# Patient Record
Sex: Female | Born: 1935 | Race: White | Hispanic: No | Marital: Single | Smoking: Former smoker
Health system: Southern US, Community
[De-identification: ages and names within clinical notes are randomized; demographics above are authoritative.]

## PROBLEM LIST (undated history)

## (undated) DIAGNOSIS — M1990 Unspecified osteoarthritis, unspecified site: Secondary | ICD-10-CM

## (undated) DIAGNOSIS — R269 Unspecified abnormalities of gait and mobility: Secondary | ICD-10-CM

## (undated) DIAGNOSIS — Z8619 Personal history of other infectious and parasitic diseases: Secondary | ICD-10-CM

## (undated) DIAGNOSIS — R4781 Slurred speech: Secondary | ICD-10-CM

## (undated) DIAGNOSIS — R05 Cough: Secondary | ICD-10-CM

## (undated) DIAGNOSIS — H269 Unspecified cataract: Secondary | ICD-10-CM

## (undated) DIAGNOSIS — M2450 Contracture, unspecified joint: Secondary | ICD-10-CM

## (undated) DIAGNOSIS — R059 Cough, unspecified: Secondary | ICD-10-CM

## (undated) DIAGNOSIS — R310 Gross hematuria: Secondary | ICD-10-CM

## (undated) DIAGNOSIS — N1330 Unspecified hydronephrosis: Secondary | ICD-10-CM

## (undated) DIAGNOSIS — N319 Neuromuscular dysfunction of bladder, unspecified: Secondary | ICD-10-CM

## (undated) DIAGNOSIS — R600 Localized edema: Secondary | ICD-10-CM

## (undated) DIAGNOSIS — I10 Essential (primary) hypertension: Secondary | ICD-10-CM

## (undated) DIAGNOSIS — J302 Other seasonal allergic rhinitis: Secondary | ICD-10-CM

## (undated) DIAGNOSIS — A419 Sepsis, unspecified organism: Secondary | ICD-10-CM

## (undated) DIAGNOSIS — N390 Urinary tract infection, site not specified: Secondary | ICD-10-CM

## (undated) DIAGNOSIS — M4800 Spinal stenosis, site unspecified: Secondary | ICD-10-CM

## (undated) DIAGNOSIS — N3020 Other chronic cystitis without hematuria: Secondary | ICD-10-CM

## (undated) DIAGNOSIS — G808 Other cerebral palsy: Secondary | ICD-10-CM

## (undated) DIAGNOSIS — M069 Rheumatoid arthritis, unspecified: Secondary | ICD-10-CM

## (undated) DIAGNOSIS — C801 Malignant (primary) neoplasm, unspecified: Secondary | ICD-10-CM

## (undated) DIAGNOSIS — N3941 Urge incontinence: Secondary | ICD-10-CM

## (undated) HISTORY — PX: DG TOES RT FOOT MULTIPLE SPECIF (ARMC HX): HXRAD1007

## (undated) HISTORY — PX: JOINT REPLACEMENT: SHX530

---

## 1993-11-10 HISTORY — PX: CHOLECYSTECTOMY: SHX55

## 2003-11-11 HISTORY — PX: TOTAL KNEE ARTHROPLASTY: SHX125

## 2012-10-12 DIAGNOSIS — M4802 Spinal stenosis, cervical region: Secondary | ICD-10-CM

## 2012-10-12 DIAGNOSIS — F411 Generalized anxiety disorder: Secondary | ICD-10-CM

## 2012-10-12 DIAGNOSIS — I4891 Unspecified atrial fibrillation: Secondary | ICD-10-CM

## 2012-10-12 DIAGNOSIS — I10 Essential (primary) hypertension: Secondary | ICD-10-CM

## 2012-11-15 ENCOUNTER — Telehealth: Payer: Self-pay | Admitting: Internal Medicine

## 2012-11-15 NOTE — Telephone Encounter (Signed)
noted 

## 2012-11-15 NOTE — Telephone Encounter (Signed)
[facility] Triage Call Report Triage Record Num: [id number] Operator: Valene Bors Patient Name: Stacey Torres Call Date & Time: 11/13/2012 12:13:03PM Patient Phone: [phone] PCP: Ruthe Mannan Patient Gender: Female PCP Fax : [phone] Patient DOB: 31-Mar-1929 [facility] Name[facility] Reason for Call: Caller: Becky/LPN; PCP: Ruthe Mannan (Family Practice); CB#: [phone]; Call regarding Urinalysis Results- U/A ordered on 11/12/12 by Dr. Dayton Martes after blood in urine on 11/10/12 and fever =100.1 oral. Afebrile today- 11/13/12. U/A showed Large Proteins and Leukocytes, > 50,000 wbc, 3-6 rbc and Many bacteria. She is having some burning and frequency. Incontinent, wears adult attends. Triage and Care Advice per Urinary Symptoms- Female Protocol MD on call contacted, Dr. Laury Axon and results reported. She ordered Cipro 250 mgs 1 PO BID x 5 days and to be sure Cultures Sent. Order given to [facility], LPN and advised to call back with Culture Results. Protocol(s) Used: Urinary Symptoms - Female Recommended Outcome per Protocol: See Provider within 72 Hours Reason for Outcome: Evaluated by provider AND no improvement in symptoms after following treatment plan for the time specified by provider Care Advice: ~ Do not go for long periods without urinating; empty your bladder completely when you urinate. ~ Go to the ED IMMEDIATELY if repeated vomiting or severe pain occurs. ~ Do not change prescribed medications, dosing regimen, or other treatments until consulting with your provider. ~ Call provider if you develop flank or low back pain, fever, generally feel sick. Continue to follow your treatment plan. Take all medications as prescribed and keep all appointments with your provider. ~ ~ SYMPTOM / CONDITION MANAGEMENT Most adults need to drink 6-10 eight-ounce glasses (1.2-2.0 liters) of fluids per day unless previously told to limit fluid intake for other medical reasons. Limit fluids  that contain caffeine, sugar or alcohol. Urine will be a very light yellow color when you drink enough fluids. ~ ~ Call provider if urine is pink, red, smoky or cola colored. 11/13/2012 1:00:12PM Page 1 of 1 CAN_TriageRpt_V2

## 2012-11-18 DIAGNOSIS — G250 Essential tremor: Secondary | ICD-10-CM

## 2012-11-18 DIAGNOSIS — F411 Generalized anxiety disorder: Secondary | ICD-10-CM

## 2012-11-18 DIAGNOSIS — M4802 Spinal stenosis, cervical region: Secondary | ICD-10-CM

## 2012-11-18 DIAGNOSIS — F329 Major depressive disorder, single episode, unspecified: Secondary | ICD-10-CM

## 2012-11-18 DIAGNOSIS — G252 Other specified forms of tremor: Secondary | ICD-10-CM

## 2012-12-02 DIAGNOSIS — R319 Hematuria, unspecified: Secondary | ICD-10-CM

## 2012-12-21 DIAGNOSIS — I10 Essential (primary) hypertension: Secondary | ICD-10-CM

## 2012-12-21 DIAGNOSIS — F329 Major depressive disorder, single episode, unspecified: Secondary | ICD-10-CM

## 2012-12-21 DIAGNOSIS — G479 Sleep disorder, unspecified: Secondary | ICD-10-CM

## 2012-12-21 DIAGNOSIS — M4802 Spinal stenosis, cervical region: Secondary | ICD-10-CM

## 2013-01-11 DIAGNOSIS — I10 Essential (primary) hypertension: Secondary | ICD-10-CM

## 2013-01-11 DIAGNOSIS — G252 Other specified forms of tremor: Secondary | ICD-10-CM

## 2013-01-11 DIAGNOSIS — M4802 Spinal stenosis, cervical region: Secondary | ICD-10-CM

## 2013-01-11 DIAGNOSIS — F329 Major depressive disorder, single episode, unspecified: Secondary | ICD-10-CM

## 2013-01-11 DIAGNOSIS — G250 Essential tremor: Secondary | ICD-10-CM

## 2013-01-25 DIAGNOSIS — J3489 Other specified disorders of nose and nasal sinuses: Secondary | ICD-10-CM

## 2013-01-25 DIAGNOSIS — G252 Other specified forms of tremor: Secondary | ICD-10-CM

## 2013-01-25 DIAGNOSIS — G250 Essential tremor: Secondary | ICD-10-CM

## 2013-02-03 DIAGNOSIS — M65849 Other synovitis and tenosynovitis, unspecified hand: Secondary | ICD-10-CM

## 2013-02-03 DIAGNOSIS — M65839 Other synovitis and tenosynovitis, unspecified forearm: Secondary | ICD-10-CM

## 2013-02-18 DIAGNOSIS — G808 Other cerebral palsy: Secondary | ICD-10-CM

## 2013-02-18 DIAGNOSIS — R259 Unspecified abnormal involuntary movements: Secondary | ICD-10-CM

## 2013-02-18 DIAGNOSIS — R269 Unspecified abnormalities of gait and mobility: Secondary | ICD-10-CM

## 2013-03-15 DIAGNOSIS — M6281 Muscle weakness (generalized): Secondary | ICD-10-CM

## 2013-03-16 ENCOUNTER — Ambulatory Visit: Payer: Self-pay | Admitting: Internal Medicine

## 2013-05-02 ENCOUNTER — Ambulatory Visit: Payer: Self-pay | Admitting: Neurology

## 2013-05-02 LAB — CREATININE, SERUM
Creatinine: 0.62 mg/dL (ref 0.60–1.30)
EGFR (Non-African Amer.): >60

## 2013-05-05 DIAGNOSIS — J209 Acute bronchitis, unspecified: Secondary | ICD-10-CM

## 2013-05-30 DIAGNOSIS — F411 Generalized anxiety disorder: Secondary | ICD-10-CM

## 2013-05-30 DIAGNOSIS — M4802 Spinal stenosis, cervical region: Secondary | ICD-10-CM

## 2013-05-30 DIAGNOSIS — M19049 Primary osteoarthritis, unspecified hand: Secondary | ICD-10-CM

## 2013-05-30 DIAGNOSIS — F329 Major depressive disorder, single episode, unspecified: Secondary | ICD-10-CM

## 2013-06-03 ENCOUNTER — Encounter: Payer: Self-pay | Admitting: Rheumatology

## 2013-06-10 ENCOUNTER — Encounter: Payer: Self-pay | Admitting: Rheumatology

## 2013-06-13 DIAGNOSIS — N3000 Acute cystitis without hematuria: Secondary | ICD-10-CM

## 2013-06-16 ENCOUNTER — Telehealth: Payer: Self-pay

## 2013-06-16 NOTE — Telephone Encounter (Signed)
Melba nurse with [facility] said pt has ESBL in urine should pt continue Bactrim ER? Dr Para March saidif any symptoms to continue Bactrim ER taking one tab twice a day for 4 more days. And then call with update on pt condition. Melba said pt has burning and itching and pus in urine. Pt to continue Bactrim ER one tab bid for 4 more days. Melba voiced understanding.

## 2013-06-17 NOTE — Telephone Encounter (Signed)
Thanks.  Routed to PCP as FYI.  

## 2013-06-18 NOTE — Telephone Encounter (Signed)
Will review at [facility] on Monday the 11th when I am back

## 2013-06-27 ENCOUNTER — Encounter: Payer: Self-pay | Admitting: Family Medicine

## 2013-06-27 ENCOUNTER — Telehealth: Payer: Self-pay | Admitting: Family Medicine

## 2013-06-27 NOTE — Telephone Encounter (Signed)
[facility] Triage Call Report Triage Record Num: [id number] Operator: Chevis Pretty Patient Name: Stacey Torres Call Date & Time: 06/25/2013 5:48:07PM Patient Phone: [phone] PCP: Ruthe Mannan Patient Gender: Female PCP Fax : [phone] Patient DOB: 12-31-1928 [facility] Name[facility] Reason for Call: Caller: Cathy/LPN Shona Simpson; PCP: Ruthe Mannan [facility]); CB#: [phone]; Call regarding Urine culture results. Culture sent 06/20/13. Positive > 100K e coli. Sensitive to Macrobid. Resistant to cipro and Bactrim DS. Had completed Bactrim antibiotic after 6 days 06/20/13. NKDA. Afebrile. Stable vital signs, in her usual mentation. TC to Dr. Beverely Low; to start Macrobid 100mg  1 po BID x 10 days. Facility to follow up with office in Am 06/27/13. Protocol(s) Used: PCP Calls, No Triage (Adult) Recommended Outcome per Protocol: Call Provider within 24 Hours Reason for Outcome: Lab calling with test results Care Advice: ~ 06/25/2013 6:04:24PM Page 1 of 1 CAN_TriageRpt_V2

## 2013-06-27 NOTE — Telephone Encounter (Signed)
Heard about this at [facility] is that this was just a follow up due to multiple resistant organism---not clear she still has UTI

## 2013-07-11 ENCOUNTER — Encounter: Payer: Self-pay | Admitting: Rheumatology

## 2013-07-14 DIAGNOSIS — M4802 Spinal stenosis, cervical region: Secondary | ICD-10-CM

## 2013-07-14 DIAGNOSIS — F411 Generalized anxiety disorder: Secondary | ICD-10-CM

## 2013-07-14 DIAGNOSIS — G252 Other specified forms of tremor: Secondary | ICD-10-CM

## 2013-07-14 DIAGNOSIS — G250 Essential tremor: Secondary | ICD-10-CM

## 2013-09-13 DIAGNOSIS — F411 Generalized anxiety disorder: Secondary | ICD-10-CM

## 2013-09-13 DIAGNOSIS — M4802 Spinal stenosis, cervical region: Secondary | ICD-10-CM

## 2013-09-13 DIAGNOSIS — G252 Other specified forms of tremor: Secondary | ICD-10-CM

## 2013-09-13 DIAGNOSIS — G479 Sleep disorder, unspecified: Secondary | ICD-10-CM

## 2013-09-13 DIAGNOSIS — I10 Essential (primary) hypertension: Secondary | ICD-10-CM

## 2013-09-13 DIAGNOSIS — G250 Essential tremor: Secondary | ICD-10-CM

## 2013-09-13 DIAGNOSIS — M069 Rheumatoid arthritis, unspecified: Secondary | ICD-10-CM

## 2013-09-16 DIAGNOSIS — N390 Urinary tract infection, site not specified: Secondary | ICD-10-CM

## 2013-10-24 ENCOUNTER — Other Ambulatory Visit: Payer: Self-pay | Admitting: Internal Medicine

## 2013-10-24 NOTE — Telephone Encounter (Signed)
Ok to fill 

## 2013-10-24 NOTE — Telephone Encounter (Signed)
rx sent to pharmacy by e-script  

## 2013-10-24 NOTE — Telephone Encounter (Signed)
Okay to fill but they shouldn't have to send this here---the nurses at [facility] can reorder these meds automatically (she lives at [facility])

## 2013-10-28 ENCOUNTER — Ambulatory Visit: Payer: Self-pay

## 2013-11-14 ENCOUNTER — Telehealth: Payer: Self-pay | Admitting: Family Medicine

## 2013-11-14 DIAGNOSIS — I4891 Unspecified atrial fibrillation: Secondary | ICD-10-CM

## 2013-11-14 DIAGNOSIS — J069 Acute upper respiratory infection, unspecified: Secondary | ICD-10-CM

## 2013-11-14 DIAGNOSIS — I10 Essential (primary) hypertension: Secondary | ICD-10-CM

## 2013-11-14 NOTE — Telephone Encounter (Signed)
[facility] Triage Call Report Triage Record Num: [id number] Operator: Benita Stabile Patient Name: Stacey Torres Call Date & Time: 11/14/2013 12:29:09AM Patient Phone: [phone] PCP: Viviana Simpler Patient Gender: Female PCP Fax : [phone] Patient DOB: 02-Apr-1929 Practice Name: [facility] Reason for Call: Caller: Stephanie/RN; PCP: Viviana Simpler (Family Practice); CB#: [phone]; Call regarding Dizziness and elevated HR. Onset 11/13/13 at 2000. 100.4temps x 2 days. Non productive cough. Pale and clammy. 135/80 137HR pulse afebrile. 96 pulse ox. 99.8oral. 2 episodes since 2000. 95/70 BP 140 HR at 0030. Irregular heartrate. Pt DNR. Take Toprol 50mg  QA 8am, Doxazosin 2mg  QA am, x months. Resting now at 0055. Dr Maudie Mercury aware and agrees with sending pt out for evaluation. Stephanie aware and will let family and pt know. Care advice given per Irregular HR Protocol. Protocol(s) Used: Irregular Heartbeat Recommended Outcome per Protocol: See ED Immediately Reason for Outcome: New onset of rapid pulse (greater than 120 beats/minute at rest) OR slow pulse (less than 50 beats per minute at rest) Care Advice: ~ Protect the patient from falling or other harm. ~ Another adult should drive. ~ Do not give the patient anything to eat or drink. ~ IMMEDIATE ACTION Write down provider's name. List or place the following in a bag for transport with the patient: current prescription and/or nonprescription medications; alternative treatments, therapies and medications; and street drugs. ~ ~ Tell provider if you are taking any erectile dysfunction medication such as Viagra, Levitra, or Cialis. 11/14/2013 12:59:25AM Page 1 of 1 CAN_TriageRpt_V2

## 2013-11-14 NOTE — Telephone Encounter (Signed)
[facility] Triage Call Report Triage Record Num: [id number] Operator: Mary King Patient Name: Stacey Torres Call Date & Time: 11/14/2013 12:29:09AM Patient Phone: [phone] PCP: Richard Letvak Patient Gender: Female PCP Fax : [phone] Patient DOB: 12/17/1928 Practice Name: [facility] Reason for Call: Caller: Stephanie/RN; PCP: Letvak , Richard (Family Practice); CB#: [phone]; Call regarding Dizziness and elevated HR. Onset 11/13/13 at 2000. 100.4temps x 2 days. Non productive cough. Pale and clammy. 135/80 137HR pulse afebrile. 96 pulse ox. 99.8oral. 2 episodes since 2000. 95/70 BP 140 HR at 0030. Irregular heartrate. Pt DNR. Take Toprol 50mg QA 8am, Doxazosin 2mg QA am, x months. Resting now at 0055. Dr Kim aware and agrees with sending pt out for evaluation. Stephanie aware and will let family and pt know. Care advice given per Irregular HR Protocol. Protocol(s) Used: Irregular Heartbeat Recommended Outcome per Protocol: See ED Immediately Reason for Outcome: New onset of rapid pulse (greater than 120 beats/minute at rest) OR slow pulse (less than 50 beats per minute at rest) Care Advice: ~ Protect the patient from falling or other harm. ~ Another adult should drive. ~ Do not give the patient anything to eat or drink. ~ IMMEDIATE ACTION Write down provider's name. List or place the following in a bag for transport with the patient: current prescription and/or nonprescription medications; alternative treatments, therapies and medications; and street drugs. ~ ~ Tell provider if you are taking any erectile dysfunction medication such as Viagra, Levitra, or Cialis. 11/14/2013 12:59:25AM Page 1 of 1 CAN_TriageRpt_V2 

## 2013-11-14 NOTE — Telephone Encounter (Signed)
Seen today Probably had paroxysm of atrial fib and is fine today Metoprolol succinate increased

## 2013-11-17 ENCOUNTER — Other Ambulatory Visit: Payer: Self-pay | Admitting: Urology

## 2013-11-17 ENCOUNTER — Encounter (HOSPITAL_BASED_OUTPATIENT_CLINIC_OR_DEPARTMENT_OTHER): Payer: Self-pay | Admitting: *Deleted

## 2013-11-18 ENCOUNTER — Encounter (HOSPITAL_BASED_OUTPATIENT_CLINIC_OR_DEPARTMENT_OTHER): Payer: Self-pay | Admitting: *Deleted

## 2013-11-18 NOTE — Progress Notes (Addendum)
Called # [phone] given by office in epic. This is pt's niece Darrick Meigs. States pt resides at [facility] in [place]. The contact # [phone] and alt. # [phone], today pt nurse is Sharee Holster. Pam states pt is w/c bound and is contractured at 90 degrees due to advanced ra and hx cerebral palsy. At this time transportation is the facility's Lucianne Lei w/ pt in w/c and pt would need to be lifted to stretcher. Informed Pam that at an ambulatory facility we did not lift since we did not have equipment. But if pt were transported my medical ambulance on stretcher that would be ok , we can side slide her to our stretcher. None of this info was not communicated from office. Pam states if ambulance is needed that may need to reschedule because of having time to arrange this. Lm w/ selita , or scheduler for dr Risa Grill, await her return call so that we can arranged the best care for pt. Also have lm w/ Pam of possiblity of pt's facility providing assistance to lift pt here.  Will await call backs.  Spoke w/ wanda gaddy Surveyor, quantity and she states unless arrangement made for stretcher transport, since we do not lift pt's per policy (at ambulatory facility we do not have equipment for lifting), pt will need to be rescheduled when pt has her own lifting help or go to main. Called and spoke w/ pt niece, pam fox, informed her of final decision. She states she will speak with her mother, pt's sister, since they will have to cover cost of transport. She is to call back with decision of either transporting via stretcher or to reschedule.  SPOKE W/ PT NIECE AND PT SISTER AND NURSE AID THEY HIRED WILL LIFT/ MOVE PT .  REVIEWED W/ HER PT HISTORY BUT STATES TO REVIEW SURGICAL HX WITH PT AND SISTER DOS.  GIVEN PT NURSE TO CONTACT, MELVA MERIT, AT [facility] [phone].  HAVE SPOKEN TO MELVA MERIT INSTRUCTIONS GIVE PT TO BE NPO OF MN WITH EXCEPT SIP OF WATER W/ TOPROL.  SHE IS FAXING CURRENT MAR, WILL  UPDATE MED. LIST.  NEEDS ISTAT AND EKG.

## 2013-11-21 ENCOUNTER — Other Ambulatory Visit: Payer: Self-pay

## 2013-11-21 ENCOUNTER — Encounter (HOSPITAL_BASED_OUTPATIENT_CLINIC_OR_DEPARTMENT_OTHER): Payer: Self-pay | Admitting: Anesthesiology

## 2013-11-21 ENCOUNTER — Encounter (HOSPITAL_BASED_OUTPATIENT_CLINIC_OR_DEPARTMENT_OTHER)
Admission: RE | Disposition: A | Discharge status: Skilled Nursing Facility | Payer: Self-pay | Source: Ambulatory Visit | Attending: Urology

## 2013-11-21 ENCOUNTER — Encounter (HOSPITAL_BASED_OUTPATIENT_CLINIC_OR_DEPARTMENT_OTHER): Payer: Medicare Other | Admitting: Anesthesiology

## 2013-11-21 ENCOUNTER — Ambulatory Visit (HOSPITAL_BASED_OUTPATIENT_CLINIC_OR_DEPARTMENT_OTHER): Payer: Medicare Other | Admitting: Anesthesiology

## 2013-11-21 ENCOUNTER — Ambulatory Visit (HOSPITAL_BASED_OUTPATIENT_CLINIC_OR_DEPARTMENT_OTHER)
Admission: RE | Admit: 2013-11-21 | Discharge: 2013-11-21 | Disposition: A | Discharge status: Skilled Nursing Facility | Payer: Medicare Other | Source: Ambulatory Visit | Attending: Urology | Admitting: Urology

## 2013-11-21 DIAGNOSIS — R310 Gross hematuria: Secondary | ICD-10-CM

## 2013-11-21 DIAGNOSIS — I499 Cardiac arrhythmia, unspecified: Secondary | ICD-10-CM | POA: Insufficient documentation

## 2013-11-21 DIAGNOSIS — Z79899 Other long term (current) drug therapy: Secondary | ICD-10-CM | POA: Insufficient documentation

## 2013-11-21 DIAGNOSIS — I10 Essential (primary) hypertension: Secondary | ICD-10-CM | POA: Insufficient documentation

## 2013-11-21 DIAGNOSIS — M129 Arthropathy, unspecified: Secondary | ICD-10-CM | POA: Insufficient documentation

## 2013-11-21 DIAGNOSIS — N3090 Cystitis, unspecified without hematuria: Secondary | ICD-10-CM | POA: Insufficient documentation

## 2013-11-21 DIAGNOSIS — N1330 Unspecified hydronephrosis: Secondary | ICD-10-CM

## 2013-11-21 HISTORY — DX: Other chronic cystitis without hematuria: N30.20

## 2013-11-21 HISTORY — PX: CYSTOSCOPY WITH RETROGRADE PYELOGRAM, URETEROSCOPY AND STENT PLACEMENT: SHX5789

## 2013-11-21 HISTORY — DX: Unspecified abnormalities of gait and mobility: R26.9

## 2013-11-21 HISTORY — DX: Rheumatoid arthritis, unspecified: M06.9

## 2013-11-21 HISTORY — DX: Contracture, unspecified joint: M24.50

## 2013-11-21 HISTORY — DX: Urge incontinence: N39.41

## 2013-11-21 HISTORY — PX: CYSTOGRAM: SHX6285

## 2013-11-21 HISTORY — DX: Other cerebral palsy: G80.8

## 2013-11-21 HISTORY — DX: Spinal stenosis, site unspecified: M48.00

## 2013-11-21 HISTORY — DX: Unspecified hydronephrosis: N13.30

## 2013-11-21 HISTORY — DX: Slurred speech: R47.81

## 2013-11-21 HISTORY — DX: Unspecified osteoarthritis, unspecified site: M19.90

## 2013-11-21 HISTORY — DX: Neuromuscular dysfunction of bladder, unspecified: N31.9

## 2013-11-21 HISTORY — DX: Essential (primary) hypertension: I10

## 2013-11-21 HISTORY — DX: Gross hematuria: R31.0

## 2013-11-21 LAB — POCT I-STAT, CHEM 8
BUN: 14 mg/dL (ref 6–23)
CREATININE: 0.5 mg/dL (ref 0.50–1.10)
Calcium, Ion: 1.16 mmol/L (ref 1.13–1.30)
Chloride: 98 mEq/L (ref 96–112)
Glucose, Bld: 91 mg/dL (ref 70–99)
HCT: 41 % (ref 36.0–46.0)
HEMOGLOBIN: 13.9 g/dL (ref 12.0–15.0)
POTASSIUM: 3.3 meq/L — AB (ref 3.7–5.3)
Sodium: 141 mEq/L (ref 137–147)
TCO2: 26 mmol/L (ref 0–100)

## 2013-11-21 SURGERY — CYSTOURETEROSCOPY, WITH RETROGRADE PYELOGRAM AND STENT INSERTION
Anesthesia: General | Site: Ureter | Laterality: Bilateral

## 2013-11-21 MED ORDER — DEXAMETHASONE SODIUM PHOSPHATE 4 MG/ML IJ SOLN
INTRAMUSCULAR | Status: DC | PRN
  Administered 2013-11-21: 4 mg via INTRAVENOUS

## 2013-11-21 MED ORDER — LIDOCAINE HCL (CARDIAC) 20 MG/ML IV SOLN
INTRAVENOUS | Status: DC | PRN
  Administered 2013-11-21: 40 mg via INTRAVENOUS

## 2013-11-21 MED ORDER — EPHEDRINE SULFATE 50 MG/ML IJ SOLN
INTRAMUSCULAR | Status: DC | PRN
  Administered 2013-11-21: 10 mg via INTRAVENOUS

## 2013-11-21 MED ORDER — FENTANYL CITRATE 0.05 MG/ML IJ SOLN
INTRAMUSCULAR | Status: AC
  Filled 2013-11-21: qty 4

## 2013-11-21 MED ORDER — IOHEXOL 350 MG/ML SOLN
INTRAVENOUS | Status: DC | PRN
  Administered 2013-11-21: 10 mL via INTRAVENOUS

## 2013-11-21 MED ORDER — FENTANYL CITRATE 0.05 MG/ML IJ SOLN
INTRAMUSCULAR | Status: DC | PRN
  Administered 2013-11-21 (×2): 25 ug via INTRAVENOUS

## 2013-11-21 MED ORDER — PROPOFOL 10 MG/ML IV BOLUS
INTRAVENOUS | Status: DC | PRN
  Administered 2013-11-21: 100 mg via INTRAVENOUS

## 2013-11-21 MED ORDER — MIDAZOLAM HCL 5 MG/5ML IJ SOLN: INTRAMUSCULAR | Status: DC | PRN

## 2013-11-21 MED ORDER — LACTATED RINGERS IV SOLN
INTRAVENOUS | Status: DC
  Administered 2013-11-21: 12:00:00 via INTRAVENOUS
  Filled 2013-11-21: qty 1000

## 2013-11-21 MED ORDER — GENTAMICIN SULFATE 40 MG/ML IJ SOLN
5.0000 mg/kg | INTRAVENOUS | Status: AC
  Administered 2013-11-21: 310 mg via INTRAVENOUS

## 2013-11-21 MED ORDER — BELLADONNA ALKALOIDS-OPIUM 16.2-60 MG RE SUPP
RECTAL | Status: AC
  Filled 2013-11-21: qty 1

## 2013-11-21 MED ORDER — ONDANSETRON HCL 4 MG/2ML IJ SOLN
INTRAMUSCULAR | Status: DC | PRN
  Administered 2013-11-21: 4 mg via INTRAVENOUS

## 2013-11-21 MED ORDER — SODIUM CHLORIDE 0.9 % IR SOLN
Status: DC | PRN
  Administered 2013-11-21: 2000 mL via INTRAVESICAL

## 2013-11-21 MED ORDER — KETOROLAC TROMETHAMINE 30 MG/ML IJ SOLN
INTRAMUSCULAR | Status: DC | PRN
  Administered 2013-11-21: 15 mg via INTRAVENOUS

## 2013-11-21 SURGICAL SUPPLY — 36 items
ADAPTER CATH URET PLST 4-6FR (CATHETERS) IMPLANT
BAG DRAIN URO-CYSTO SKYTR STRL (DRAIN) ×4 IMPLANT
BASKET LASER NITINOL 1.9FR (BASKET) IMPLANT
BASKET STNLS GEMINI 4WIRE 3FR (BASKET) IMPLANT
BASKET ZERO TIP NITINOL 2.4FR (BASKET) ×4 IMPLANT
BENZOIN TINCTURE PRP APPL 2/3 (GAUZE/BANDAGES/DRESSINGS) IMPLANT
CANISTER SUCT LVC 12 LTR MEDI- (MISCELLANEOUS) ×4 IMPLANT
CATH FOLEY 2WAY SLVR  5CC 16FR (CATHETERS) ×2
CATH FOLEY 2WAY SLVR 5CC 16FR (CATHETERS) ×2 IMPLANT
CATH INTERMIT  6FR 70CM (CATHETERS) ×4 IMPLANT
CATH URET 5FR 28IN CONE TIP (BALLOONS)
CATH URET 5FR 28IN OPEN ENDED (CATHETERS) IMPLANT
CATH URET 5FR 70CM CONE TIP (BALLOONS) IMPLANT
CLOTH BEACON ORANGE TIMEOUT ST (SAFETY) ×4 IMPLANT
DRAPE CAMERA CLOSED 9X96 (DRAPES) ×4 IMPLANT
DRSG TEGADERM 2-3/8X2-3/4 SM (GAUZE/BANDAGES/DRESSINGS) IMPLANT
FIBER LASER FLEXIVA 1000 (UROLOGICAL SUPPLIES) IMPLANT
FIBER LASER FLEXIVA 200 (UROLOGICAL SUPPLIES) IMPLANT
FIBER LASER FLEXIVA 365 (UROLOGICAL SUPPLIES) IMPLANT
FIBER LASER FLEXIVA 550 (UROLOGICAL SUPPLIES) IMPLANT
GLOVE BIO SURGEON STRL SZ7.5 (GLOVE) ×4 IMPLANT
GOWN STRL REIN XL XLG (GOWN DISPOSABLE) IMPLANT
GUIDEWIRE 0.038 PTFE COATED (WIRE) IMPLANT
GUIDEWIRE ANG ZIPWIRE 038X150 (WIRE) IMPLANT
GUIDEWIRE STR DUAL SENSOR (WIRE) ×4 IMPLANT
IV NS IRRIG 3000ML ARTHROMATIC (IV SOLUTION) ×8 IMPLANT
KIT BALLIN UROMAX 15FX10 (LABEL) IMPLANT
KIT BALLN UROMAX 15FX4 (MISCELLANEOUS) IMPLANT
KIT BALLN UROMAX 26 75X4 (MISCELLANEOUS)
NS IRRIG 500ML POUR BTL (IV SOLUTION) IMPLANT
PACK CYSTOSCOPY (CUSTOM PROCEDURE TRAY) ×4 IMPLANT
SET HIGH PRES BAL DIL (LABEL)
SHEATH ACCESS URETERAL 24CM (SHEATH) ×4 IMPLANT
SHEATH ACCESS URETERAL 38CM (SHEATH) IMPLANT
SHEATH ACCESS URETERAL 54CM (SHEATH) IMPLANT
SYRINGE 10CC LL (SYRINGE) ×4 IMPLANT

## 2013-11-21 NOTE — Anesthesia Procedure Notes (Signed)
Procedure Name: LMA Insertion Date/Time: 11/21/2013 11:56 AM Performed by: Mechele Claude Pre-anesthesia Checklist: Patient identified, Emergency Drugs available, Suction available and Patient being monitored Patient Re-evaluated:Patient Re-evaluated prior to inductionOxygen Delivery Method: Circle System Utilized Preoxygenation: Pre-oxygenation with 100% oxygen Intubation Type: IV induction Ventilation: Mask ventilation without difficulty LMA: LMA inserted LMA Size: 4.0 Number of attempts: 1 Airway Equipment and Method: bite block Placement Confirmation: positive ETCO2 Tube secured with: Tape Dental Injury: Teeth and Oropharynx as per pre-operative assessment

## 2013-11-21 NOTE — Discharge Instructions (Signed)

## 2013-11-21 NOTE — Interval H&P Note (Signed)
History and Physical Interval Note:  11/21/2013 11:24 AM  Stacey Torres  has presented today for surgery, with the diagnosis of GROSS HEMATURIA, RIGHT HYDRONEPHROSIS  The various methods of treatment have been discussed with the patient and family. After consideration of risks, benefits and other options for treatment, the patient has consented to  Procedure(s): CYSTOSCOPY WITH BILATERAL RETROGRADE PYELOGRAM, CYSTOGRAM, RIGHT FLEXIBLE URETEROSCOPY AND POSSIBLE RIGHT STENT PLACEMENT (Bilateral) as a surgical intervention .  The patient's history has been reviewed, patient examined, no change in status, stable for surgery.  I have reviewed the patient's chart and labs.  Questions were answered to the patient's satisfaction.     Milam Allbaugh S

## 2013-11-21 NOTE — Anesthesia Preprocedure Evaluation (Signed)
Anesthesia Evaluation  Patient identified by MRN, date of birth, ID band Patient awake    Reviewed: Allergy & Precautions, H&P , NPO status , Patient's Chart, lab work & pertinent test results  Airway Mallampati: II TM Distance: >3 FB Neck ROM: Full    Dental  (+) Poor Dentition, Missing and Dental Advisory Given   Pulmonary neg pulmonary ROS, former smoker,  breath sounds clear to auscultation  Pulmonary exam normal       Cardiovascular hypertension, Pt. on medications Rhythm:Regular Rate:Normal     Neuro/Psych CP, Hemiplegic; spinal stenosis negative psych ROS   GI/Hepatic negative GI ROS,   Endo/Other    Renal/GU Renal diseasenegative Renal ROS   Neurogenic bladder negative genitourinary   Musculoskeletal  (+) Arthritis -, Rheumatoid disorders,    Abdominal   Peds  Hematology negative hematology ROS (+)   Anesthesia Other Findings   Reproductive/Obstetrics negative OB ROS                           Anesthesia Physical Anesthesia Plan  ASA: III  Anesthesia Plan: General   Post-op Pain Management:    Induction: Intravenous  Airway Management Planned: LMA  Additional Equipment:   Intra-op Plan:   Post-operative Plan: Extubation in OR  Informed Consent: I have reviewed the patients History and Physical, chart, labs and discussed the procedure including the risks, benefits and alternatives for the proposed anesthesia with the patient or authorized representative who has indicated his/her understanding and acceptance.     Plan Discussed with: CRNA  Anesthesia Plan Comments:         Anesthesia Quick Evaluation

## 2013-11-21 NOTE — Transfer of Care (Signed)
Immediate Anesthesia Transfer of Care Note  Patient: Stacey Torres  Procedure(s) Performed: Procedure(s) (LRB): CYSTOSCOPY WITH RIGHT RETROGRADE PYELOGRAM, RIGHT FLEXIBLE and rigid URETEROSCOPY  PLACEMENT (Bilateral) CYSTOGRAM  Patient Location: PACU  Anesthesia Type: General  Level of Consciousness: awake, alert  and oriented  Airway & Oxygen Therapy: Patient Spontanous Breathing and Patient connected to face mask oxygen  Post-op Assessment: Report given to PACU RN and Post -op Vital signs reviewed and stable  Post vital signs: Reviewed and stable  Complications: No apparent anesthesia complications

## 2013-11-21 NOTE — H&P (Signed)
History of Present Illness   Ms. Mccullars presents today for followup and is here today with her niece. Last several notes will outline her complicated history. We saw her with problems with recurrent/persistent cystitis as well as some intermittent gross hematuria. She has had continued positive urine cultures. As part of her assessment, she underwent ultrasonography, which clearly showed some right-sided significant hydronephrosis. Renal function was normal and she has not had any significant flank discomfort.   We felt it would be important to obtain additional imaging studies. She underwent CT of her abdomen and pelvis with and without IV contrast. There was no evidence of nephrolithiasis or ureterolithiasis. The patient was noted to have moderate to severe hydronephrosis with distention of the ureter all the way down to the ureterovesical junction. The patient was felt to have a number of filling defects within the collecting system on the right side. The radiologist felt these were more likely to represent debris or clot, but could not exclude the possibility of transitional cell carcinoma/neoplasm. There was some evidence of mild hydronephrosis on the left. A definitive etiology for the obstruction could not be determined. It remains possible that she actually has vesical ureteral reflux. While we have obtained additional information, we still do not have definitive answers. Her overall clinical situation is otherwise relatively unchanged. She has had at least 1 episode of recurrent gross hematuria since her last visit.     Past Medical History Problems  1. History of arthritis (V13.4) 2. History of cardiac arrhythmia (V12.59) 3. History of hypertension (V12.59)  Surgical History Problems  1. History of Cholecystectomy 2. History of Foot Surgery 3. History of Knee Replacement  Current Meds 1. Atrovent HFA AERS;  Therapy: (Recorded:11Nov2014) to Recorded 2. Calcium CAPS;  Therapy:  (Recorded:11Nov2014) to Recorded 3. Claritin TABS;  Therapy: (Recorded:11Nov2014) to Recorded 4. Cymbalta CPEP (DULoxetine HCl);  Therapy: (Recorded:11Nov2014) to Recorded 5. Doxazosin Mesylate 2 MG Oral Tablet;  Therapy: (Recorded:11Nov2014) to Recorded 6. Multi Vitamin/Minerals TABS;  Therapy: (Recorded:11Nov2014) to Recorded 7. Neurontin TABS (Gabapentin);  Therapy: (YQMVHQIO:[id number]) to Recorded 8. Plaquenil 200 MG Oral Tablet (Hydroxychloroquine Sulfate);  Therapy: (Recorded:11Nov2014) to Recorded 9. Primidone 50 MG Oral Tablet;  Therapy: (Recorded:11Nov2014) to Recorded 10. Senna-S TABS;   Therapy: (Recorded:11Nov2014) to Recorded 11. Toprol XL 50 MG Oral Tablet Extended Release 24 Hour (Metoprolol Succinate ER);   Therapy: (Recorded:11Nov2014) to Recorded 12. TraZODone HCl - 50 MG Oral Tablet;   Therapy: (Recorded:11Nov2014) to Recorded 13. Tylenol TABS;   Therapy: (Recorded:11Nov2014) to Recorded 14. Xanax TABS (ALPRAZolam);   Therapy: (Recorded:11Nov2014) to Recorded  Allergies Medication  1. No Known Drug Allergies  Family History Problems  1. No pertinent family history 2. No pertinent family history : Mother  Social History Problems  1. Denied: History of Alcohol use 2. Caffeine use (V49.89) 3. Never a smoker (V49.89) 4. Retired 36. Single  Review of Systems Genitourinary, constitutional, skin, eye, otolaryngeal, hematologic/lymphatic, cardiovascular, pulmonary, endocrine, musculoskeletal, gastrointestinal, neurological and psychiatric system(s) were reviewed and pertinent findings if present are noted.  Genitourinary: urinary frequency, urinary urgency, incontinence, hematuria and cloudy urine.  Constitutional: feeling tired (fatigue).  Respiratory: cough.  Musculoskeletal: back pain.  Psychiatric: anxiety.    Vitals Vital Signs [Data Includes: Last 1 Day]  Recorded: [id number] 01:30PM  Blood Pressure: 139 / 76 Temperature: 98.1 F Heart Rate:  82  Physical Exam Constitutional: Well nourished and well developed . No acute distress.  ENT:. The ears and nose are normal in appearance.  Neck: The appearance of  the neck is normal and no neck mass is present.  Pulmonary: No respiratory distress and normal respiratory rhythm and effort.  Cardiovascular: Heart rate and rhythm are normal . No peripheral edema.  Abdomen: The abdomen is soft and nontender. No masses are palpated. No CVA tenderness. No hernias are palpable. No hepatosplenomegaly noted.  Genitourinary:  Chaperone Present: laura.  Examination of the external genitalia shows vulvar atrophy. Vaginal exam demonstrates atrophy. No cystocele is identified.  Skin: Normal skin turgor, no visible rash and no visible skin lesions.  Neuro/Psych:. Mood and affect are appropriate. The patient is hemipalegic.    Assessment Assessed  1. Gross hematuria (599.71) 2. Chronic cystitis (595.2) 3. Hydronephrosis (591) 4. Urge incontinence of urine (788.31)  Plan Chronic cystitis  1. URINE CULTURE; Status:Hold For - Specimen/Data Collection,Appointment; Requested  [phone];  Gross hematuria  2. URINE CYTOLOGY; Status:Hold For - Specimen/Data Collection,Appointment;  Requested PNT:[id number];  3. Follow-up Schedule Surgery Office  Follow-up  Status: Hold For - Appointment   Requested for: [id number] Health Maintenance  4. UA With REFLEX; [Do Not Release]; Status:In Progress - Specimen/Data Collected;    Done: [id number]  Discussion/Summary   Ms. Neto has continued to have evidence of ongoing cystitis and has continued to have at least some recurrent gross hematuria. She clearly had significant right-sided hydroureteronephrosis and questionable mild dilation on the left. The etiology remains unclear despite the CT with contrast. Unfortunately, I do not have the actual images to review and they are not available on the EPIC system. I will request that the CD with the films be sent  to my office for review sometime in the next several days. In the interim, we do need to make arrangements for additional assessment. I would attempt to perform cystoscopy as an outpatient. We need to reexamine her bladder carefully to make absolute certainty there is no inner bladder pathology. We will perform a cystogram to determine if her hydronephrosis is on the basis of vesicoureteral reflux or not. Then, depending on what is found, we may have to consider retrograde pyelography as well as some direct visual ureteroscopy. This will be primarily important on the right side and will probably require flexible ureteroscopy. Complicating factors will be the fact that her urethra is very difficult anatomically and she has vaginal atrophy with a retracted urethra. In addition, she has cerebral palsy with significant lower extremity contractions and therefore positioning and access to her bladder may be very difficult. It is possible we will be unable to access her ureters. We should at least be able to get a good inspection of her bladder and cystogram, but will hopefully be able to do a full assessment of her urinary tract. We will set this up hopefully for some time in the next couple of weeks.

## 2013-11-21 NOTE — Op Note (Signed)
Preoperative diagnosis: Chronic cystitis, recurrent gross hematuria, right hydronephrosis Postoperative diagnosis: Same  Procedure: Cystoscopy, cystogram, right retrograde pyelogram, right rigid and flexible ureteroscopy   Surgeon: Bernestine Amass M.D.  Anesthesia: Gen.  Indications: Stacey Torres is currently 80 years of age. She does have cervical palsy and is in a nursing facility. Recently she has had problems with recurrent/persistent bacteriuria/chronic cystitis. She is also had intermittent gross hematuria. She underwent multiple cystoscopy in our office which failed to reveal any definitive pathology. Renal ultrasound however showed severe right-sided hydronephrosis. The patient socially had CT of her abdomen and pelvis with IV contrast. This showed hydroureteronephrosis down to the level of the bladder. An obvious stone cannot be appreciated. The patient did have multiple filling defects of the collecting system there were thought to represent blood clot or debris. Transitional cell carcinoma cannot be ruled out. Office cytology did not show any evidence of malignant cells. She has continued to have chronic bacteriuria. Her overall systemic renal function has been normal. She presents now for further assessment.     Technique and findings: Patient was brought the operating room she had successful induction general anesthesia. Because of severe lower extremity contractures she was not able to be placed in the standard lithotomy position. To the operating room nurses carefully held her legs apart as much as possible to allow access to the vagina. She was prepped and draped in the usual manner. We started her assessment with placement of a Foley catheter for cystogram. The bladder was filled with approximately 300 cc of contrast. The bladder did show some trabecular change but I could not appreciate definitive evidence of reflux on either side.   The Foley catheter was removed. Cystoscopy was then  performed. The bladder showed moderate trabecular change with some bladder wall thickening but no evidence of bowel obstruction. There was some slight diffuse erythema of her bladder consistent with chronic cystitis but no evidence of obvious bladder tumor. There did not appear to be any bleeding from either ureteral orifice.   Right retrograde pyelography was performed with an open-ended catheter. The right distal ureter was cannulated without much difficulty. There was some kinking of the ureter with dilation almost immediately outside the ureterovesical junction. I cannot appreciate an obvious filling defect. The ureter did show a fair amount of tortuosity. A guidewire was placed up to the right renal pelvis.   The distal right ureter was then engaged with a 6 French rigid ureteroscope. The ureteral orifice itself did not appear to be significantly stenotic and I was able to drive the ureteroscope up to the intermural ureter without difficulty. I was unable however to examine the more proximal ureter or collecting system. That reason of the guidewire we placed a access sheath. This went very easily. I was able to then insert the flexible ureteroscope. Careful inspection of all calyces and the renal pelvis as well as the proximal ureter revealed no evidence of obvious transitional cell carcinoma. I elected not to place a stent given how easy it was to engage the ureter. The patient is very immobile as a great deal of difficulty getting back in [place] which would be necessary for a flexible cystoscopy and double-J stent removal. We will have to monitor her. I did not determine a obvious etiology for her hydronephrosis. There did not appear to be an obstructive component and no gross evidence of reflux. 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 The other possibility is  chronic dilation from previous obstruction. Given her current age and normal renal function I think she will be observed.

## 2013-11-22 ENCOUNTER — Encounter (HOSPITAL_BASED_OUTPATIENT_CLINIC_OR_DEPARTMENT_OTHER): Payer: Self-pay | Admitting: Urology

## 2013-11-22 MED FILL — Gentamicin Sulfate Inj 40 MG/ML: INTRAMUSCULAR | Qty: 16 | Status: AC

## 2013-11-22 NOTE — Anesthesia Postprocedure Evaluation (Signed)
Anesthesia Post Note  Patient: Stacey Torres  Procedure(s) Performed: Procedure(s) (LRB): CYSTOSCOPY WITH RIGHT RETROGRADE PYELOGRAM, RIGHT FLEXIBLE and rigid URETEROSCOPY  PLACEMENT (Bilateral) CYSTOGRAM  Anesthesia type: General  Patient location: PACU  Post pain: Pain level controlled  Post assessment: Post-op Vital signs reviewed  Last Vitals:  Filed Vitals:   11/21/13 1400  BP: 106/60  Pulse: 92  Temp: 37.2 C  Resp: 16    Post vital signs: Reviewed  Level of consciousness: sedated  Complications: No apparent anesthesia complications

## 2013-11-23 DIAGNOSIS — J029 Acute pharyngitis, unspecified: Secondary | ICD-10-CM

## 2013-11-23 DIAGNOSIS — J069 Acute upper respiratory infection, unspecified: Secondary | ICD-10-CM

## 2013-11-25 DIAGNOSIS — G8190 Hemiplegia, unspecified affecting unspecified side: Secondary | ICD-10-CM

## 2013-11-25 DIAGNOSIS — R21 Rash and other nonspecific skin eruption: Secondary | ICD-10-CM

## 2013-12-20 DIAGNOSIS — R21 Rash and other nonspecific skin eruption: Secondary | ICD-10-CM

## 2014-01-12 ENCOUNTER — Ambulatory Visit (INDEPENDENT_AMBULATORY_CARE_PROVIDER_SITE_OTHER): Payer: Medicare Other | Admitting: Podiatrist

## 2014-01-12 ENCOUNTER — Encounter: Payer: Self-pay | Admitting: Podiatrist

## 2014-01-12 VITALS — BP 117/68 | HR 82 | Resp 16 | Ht 63.0 in | Wt 124.0 lb

## 2014-01-12 DIAGNOSIS — B351 Tinea unguium: Secondary | ICD-10-CM

## 2014-01-12 DIAGNOSIS — M24573 Contracture, unspecified ankle: Secondary | ICD-10-CM

## 2014-01-12 DIAGNOSIS — M24576 Contracture, unspecified foot: Secondary | ICD-10-CM

## 2014-01-12 DIAGNOSIS — M79609 Pain in unspecified limb: Secondary | ICD-10-CM

## 2014-01-13 NOTE — Progress Notes (Signed)
Chief Complaint  Patient presents with  . Follow-up    check the feet out     HPI: Patient is 80 y.o. female who presents today for a general check of her foot health and for painful toenails.  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  She is also experiencing pain on her right great toenail as well.  She has decrease in sensation subjectively as well.  Significant past medical history includes cerebral palsy and rheumatoid arthritis with contracture deformitys present.      Physical Exam GENERAL APPEARANCE: Alert, conversant. Appropriately groomed. No acute distress. Presents today with a caregiver in her wheelchair. VASCULAR: Pedal pulses palpable at 1/4 DP and PT bilateral.  Capillary refill time is immediate to all digits,  Proximal to distal cooling it warm to warm.   NEUROLOGIC: sensation is decreased epicritically and protectively to 5.07 monofilament at 2/5 sites bilateral.  Light touch is intact bilateral, vibratory sensation decreased bilateral,  MUSCULOSKELETAL: Significant contracture deformity at the right foot is noted. Contracture of all digits is noted and contracture of knee ankle and foot present right. Left foot does not have a significant contracture present and a pes planus deformity is noted. DERMATOLOGIC: Patient's toenails are elongated, thickened, discolored, dystrophic and painful 1 through 5 left and 1, 2, 3 right digital nails 4 and 5 have previously been removed permanently. No interdigital maceration, no ulcerations present.   Assessment: Symptomatic mycotic toenails  Plan: Debrided the toenails without complication at today's visit to the best of my ability as the patient was unable to transfer from her wheelchair to the treatment chair. She tolerated this well and I recommended routine care every 3-4 months per her request. If any problems or concerns arise she is instructed to call my me  know.

## 2014-01-16 DIAGNOSIS — M4802 Spinal stenosis, cervical region: Secondary | ICD-10-CM

## 2014-01-16 DIAGNOSIS — G252 Other specified forms of tremor: Secondary | ICD-10-CM

## 2014-01-16 DIAGNOSIS — G250 Essential tremor: Secondary | ICD-10-CM

## 2014-01-16 DIAGNOSIS — F411 Generalized anxiety disorder: Secondary | ICD-10-CM

## 2014-02-24 DIAGNOSIS — M25549 Pain in joints of unspecified hand: Secondary | ICD-10-CM

## 2014-03-03 DIAGNOSIS — R238 Other skin changes: Secondary | ICD-10-CM

## 2014-03-15 DIAGNOSIS — M4802 Spinal stenosis, cervical region: Secondary | ICD-10-CM

## 2014-03-15 DIAGNOSIS — G3109 Other frontotemporal dementia: Secondary | ICD-10-CM

## 2014-03-15 DIAGNOSIS — M069 Rheumatoid arthritis, unspecified: Secondary | ICD-10-CM

## 2014-03-15 DIAGNOSIS — I4891 Unspecified atrial fibrillation: Secondary | ICD-10-CM

## 2014-03-15 DIAGNOSIS — F0280 Dementia in other diseases classified elsewhere without behavioral disturbance: Secondary | ICD-10-CM

## 2014-03-15 DIAGNOSIS — I10 Essential (primary) hypertension: Secondary | ICD-10-CM

## 2014-03-15 DIAGNOSIS — F411 Generalized anxiety disorder: Secondary | ICD-10-CM

## 2014-03-20 DIAGNOSIS — R059 Cough, unspecified: Secondary | ICD-10-CM

## 2014-03-20 DIAGNOSIS — R05 Cough: Secondary | ICD-10-CM

## 2014-03-27 DIAGNOSIS — J209 Acute bronchitis, unspecified: Secondary | ICD-10-CM

## 2014-04-05 DIAGNOSIS — R05 Cough: Secondary | ICD-10-CM

## 2014-04-05 DIAGNOSIS — R059 Cough, unspecified: Secondary | ICD-10-CM

## 2014-04-13 ENCOUNTER — Ambulatory Visit: Payer: Medicare Other | Admitting: Podiatrist

## 2014-04-21 DIAGNOSIS — L988 Other specified disorders of the skin and subcutaneous tissue: Secondary | ICD-10-CM

## 2014-05-04 DIAGNOSIS — Z1619 Resistance to other specified beta lactam antibiotics: Secondary | ICD-10-CM

## 2014-05-24 DIAGNOSIS — R259 Unspecified abnormal involuntary movements: Secondary | ICD-10-CM

## 2014-05-24 DIAGNOSIS — I10 Essential (primary) hypertension: Secondary | ICD-10-CM

## 2014-05-24 DIAGNOSIS — M4802 Spinal stenosis, cervical region: Secondary | ICD-10-CM

## 2014-05-24 DIAGNOSIS — M069 Rheumatoid arthritis, unspecified: Secondary | ICD-10-CM

## 2014-05-24 DIAGNOSIS — F411 Generalized anxiety disorder: Secondary | ICD-10-CM

## 2014-07-13 DIAGNOSIS — I4891 Unspecified atrial fibrillation: Secondary | ICD-10-CM

## 2014-07-13 DIAGNOSIS — M24549 Contracture, unspecified hand: Secondary | ICD-10-CM

## 2014-07-13 DIAGNOSIS — M4802 Spinal stenosis, cervical region: Secondary | ICD-10-CM

## 2014-07-13 DIAGNOSIS — F411 Generalized anxiety disorder: Secondary | ICD-10-CM

## 2014-07-31 DIAGNOSIS — N390 Urinary tract infection, site not specified: Secondary | ICD-10-CM

## 2014-08-14 DIAGNOSIS — R1012 Left upper quadrant pain: Secondary | ICD-10-CM

## 2014-08-16 DIAGNOSIS — K5900 Constipation, unspecified: Secondary | ICD-10-CM

## 2014-08-16 DIAGNOSIS — M545 Low back pain: Secondary | ICD-10-CM

## 2014-08-21 DIAGNOSIS — S0083XA Contusion of other part of head, initial encounter: Secondary | ICD-10-CM

## 2014-08-28 ENCOUNTER — Encounter: Payer: Self-pay | Admitting: Rheumatology

## 2014-09-13 DIAGNOSIS — G479 Sleep disorder, unspecified: Secondary | ICD-10-CM

## 2014-09-13 DIAGNOSIS — I4891 Unspecified atrial fibrillation: Secondary | ICD-10-CM

## 2014-09-13 DIAGNOSIS — M4802 Spinal stenosis, cervical region: Secondary | ICD-10-CM

## 2014-09-13 DIAGNOSIS — I10 Essential (primary) hypertension: Secondary | ICD-10-CM

## 2014-09-13 DIAGNOSIS — M452 Ankylosing spondylitis of cervical region: Secondary | ICD-10-CM

## 2014-09-13 DIAGNOSIS — F419 Anxiety disorder, unspecified: Secondary | ICD-10-CM

## 2014-10-16 DIAGNOSIS — M542 Cervicalgia: Secondary | ICD-10-CM

## 2014-11-13 ENCOUNTER — Telehealth: Payer: Self-pay

## 2014-11-13 NOTE — Telephone Encounter (Signed)
PLEASE NOTE: All timestamps contained within this report are represented as [place] Standard Time. CONFIDENTIALTY NOTICE: This fax transmission is intended only for the addressee. It contains information that is legally privileged, confidential or otherwise protected from use or disclosure. If you are not the intended recipient, you are strictly prohibited from reviewing, disclosing, copying using or disseminating any of this information or taking any action in reliance on or regarding this information. If you have received this fax in error, please notify us immediately by telephone so that we can arrange for its return to [place]. Phone: [phone], Toll-Free: [phone], Fax: [phone] Page: 1 of 1 Call Id: [id number] [place] Patient Name: Stacey Torres Gender: Female DOB: 18-Aug-1929 Age: 81 Y 1 M 26 D Return Phone Number: Address: City/State/Zip: [place] [address] Client [place] Night - Client Client Site [facility] Viviana Simpler Contact Type Call Call Type Page Only Caller Name Stephaine Relationship To Patient Provider Is this call to report lab results? Yes Tech Name Same Name of Lab Same Lab Phone Number Same Lab Reference Number N/A Return Phone Number Unavailable Initial Comment ( 1st Attempt ) Caller states she Colletta [place] with [facility] [phone], Patient might have UTI, orders to collect sample. Nurse Assessment Guidelines Guideline Title Affirmed Question Affirmed Notes Nurse Date/Time (Eastern Time) Disp. Time Eilene Ghazi Time) Disposition Final User 11/12/2014 3:48:32 PM Send to [place], [facility] 11/12/2014 3:50:01 PM Called On-Call Provider Doug Sou, [place] 11/12/2014 3:51:31 PM Page Completed Yes Doug Sou, [place] After Care Instructions Given Call Event Type User Date / Time  Description Paging DoctorName DoctorPhone DateTime Result/Outcome Notes Crissie Sickles [phone] 11/12/2014 3:50:01 PM Called On Call Provider - Reached ( 1st Attempt ) Crissie Sickles 11/12/2014 3:51:26 PM Spoke with On Call - General Provided on-call with page information . Connected the oncall to the caller.

## 2014-11-27 DIAGNOSIS — M4802 Spinal stenosis, cervical region: Secondary | ICD-10-CM

## 2014-11-27 DIAGNOSIS — F419 Anxiety disorder, unspecified: Secondary | ICD-10-CM

## 2014-11-27 DIAGNOSIS — M0600 Rheumatoid arthritis without rheumatoid factor, unspecified site: Secondary | ICD-10-CM

## 2014-11-27 DIAGNOSIS — I480 Paroxysmal atrial fibrillation: Secondary | ICD-10-CM

## 2015-01-12 DIAGNOSIS — F419 Anxiety disorder, unspecified: Secondary | ICD-10-CM

## 2015-01-12 DIAGNOSIS — G479 Sleep disorder, unspecified: Secondary | ICD-10-CM

## 2015-01-12 DIAGNOSIS — M0580 Other rheumatoid arthritis with rheumatoid factor of unspecified site: Secondary | ICD-10-CM | POA: Diagnosis not present

## 2015-01-12 DIAGNOSIS — J069 Acute upper respiratory infection, unspecified: Secondary | ICD-10-CM

## 2015-01-12 DIAGNOSIS — M4802 Spinal stenosis, cervical region: Secondary | ICD-10-CM | POA: Diagnosis not present

## 2015-01-12 DIAGNOSIS — I10 Essential (primary) hypertension: Secondary | ICD-10-CM | POA: Diagnosis not present

## 2015-01-12 DIAGNOSIS — I4891 Unspecified atrial fibrillation: Secondary | ICD-10-CM | POA: Diagnosis not present

## 2015-02-16 DIAGNOSIS — B372 Candidiasis of skin and nail: Secondary | ICD-10-CM | POA: Diagnosis not present

## 2015-03-07 ENCOUNTER — Other Ambulatory Visit: Payer: Self-pay | Admitting: Obstetrics and Gynecology

## 2015-03-07 DIAGNOSIS — N950 Postmenopausal bleeding: Secondary | ICD-10-CM

## 2015-03-13 ENCOUNTER — Ambulatory Visit: Admission: RE | Admit: 2015-03-13 | Payer: Medicare Other | Source: Ambulatory Visit

## 2015-03-13 ENCOUNTER — Ambulatory Visit
Admission: RE | Admit: 2015-03-13 | Discharge: 2015-03-13 | Disposition: A | Discharge status: Home / Self Care | Payer: Medicare Other | Source: Ambulatory Visit | Attending: Obstetrics and Gynecology | Admitting: Obstetrics and Gynecology

## 2015-03-13 DIAGNOSIS — N8320 Unspecified ovarian cysts: Secondary | ICD-10-CM | POA: Insufficient documentation

## 2015-03-13 DIAGNOSIS — N950 Postmenopausal bleeding: Secondary | ICD-10-CM | POA: Diagnosis present

## 2015-03-13 DIAGNOSIS — N858 Other specified noninflammatory disorders of uterus: Secondary | ICD-10-CM | POA: Insufficient documentation

## 2015-03-13 DIAGNOSIS — N329 Bladder disorder, unspecified: Secondary | ICD-10-CM | POA: Insufficient documentation

## 2015-03-15 DIAGNOSIS — F411 Generalized anxiety disorder: Secondary | ICD-10-CM

## 2015-03-15 DIAGNOSIS — I480 Paroxysmal atrial fibrillation: Secondary | ICD-10-CM | POA: Diagnosis not present

## 2015-03-15 DIAGNOSIS — M4802 Spinal stenosis, cervical region: Secondary | ICD-10-CM | POA: Diagnosis not present

## 2015-03-15 DIAGNOSIS — G250 Essential tremor: Secondary | ICD-10-CM | POA: Diagnosis not present

## 2015-03-15 DIAGNOSIS — M059 Rheumatoid arthritis with rheumatoid factor, unspecified: Secondary | ICD-10-CM

## 2015-03-30 ENCOUNTER — Telehealth: Payer: Self-pay | Admitting: *Deleted

## 2015-03-30 NOTE — Telephone Encounter (Signed)
Per Dr. Theora Gianotti. Pt can be schedule in 6/1 in early am with Dr. Fransisca Connors to be seen. However, she is recommending that [facility] perform at ct scan of abd/pelvis to evaluate post menopausal bleeding since endometrial bx has not been able to be performed. Izora Gala at [employer] will give msg to ref. md.

## 2015-04-01 ENCOUNTER — Encounter: Payer: Self-pay | Admitting: *Deleted

## 2015-04-01 ENCOUNTER — Inpatient Hospital Stay
Admission: EM | Admit: 2015-04-01 | Discharge: 2015-04-05 | DRG: 872 | Disposition: A | Discharge status: Skilled Nursing Facility | Payer: Medicare Other | Attending: Internal Medicine | Admitting: Internal Medicine

## 2015-04-01 ENCOUNTER — Emergency Department: Payer: Medicare Other

## 2015-04-01 DIAGNOSIS — N1330 Unspecified hydronephrosis: Secondary | ICD-10-CM | POA: Diagnosis present

## 2015-04-01 DIAGNOSIS — Z87891 Personal history of nicotine dependence: Secondary | ICD-10-CM | POA: Diagnosis not present

## 2015-04-01 DIAGNOSIS — Z96651 Presence of right artificial knee joint: Secondary | ICD-10-CM | POA: Diagnosis present

## 2015-04-01 DIAGNOSIS — E876 Hypokalemia: Secondary | ICD-10-CM | POA: Diagnosis present

## 2015-04-01 DIAGNOSIS — M069 Rheumatoid arthritis, unspecified: Secondary | ICD-10-CM | POA: Diagnosis present

## 2015-04-01 DIAGNOSIS — N950 Postmenopausal bleeding: Secondary | ICD-10-CM | POA: Diagnosis present

## 2015-04-01 DIAGNOSIS — N3941 Urge incontinence: Secondary | ICD-10-CM | POA: Diagnosis present

## 2015-04-01 DIAGNOSIS — G8190 Hemiplegia, unspecified affecting unspecified side: Secondary | ICD-10-CM | POA: Diagnosis present

## 2015-04-01 DIAGNOSIS — G809 Cerebral palsy, unspecified: Secondary | ICD-10-CM | POA: Diagnosis present

## 2015-04-01 DIAGNOSIS — N3000 Acute cystitis without hematuria: Secondary | ICD-10-CM | POA: Diagnosis present

## 2015-04-01 DIAGNOSIS — Z66 Do not resuscitate: Secondary | ICD-10-CM | POA: Diagnosis present

## 2015-04-01 DIAGNOSIS — N319 Neuromuscular dysfunction of bladder, unspecified: Secondary | ICD-10-CM | POA: Diagnosis present

## 2015-04-01 DIAGNOSIS — M2450 Contracture, unspecified joint: Secondary | ICD-10-CM | POA: Diagnosis present

## 2015-04-01 DIAGNOSIS — A419 Sepsis, unspecified organism: Secondary | ICD-10-CM | POA: Diagnosis not present

## 2015-04-01 DIAGNOSIS — N390 Urinary tract infection, site not specified: Secondary | ICD-10-CM

## 2015-04-01 DIAGNOSIS — Z6820 Body mass index (BMI) 20.0-20.9, adult: Secondary | ICD-10-CM

## 2015-04-01 DIAGNOSIS — Z7401 Bed confinement status: Secondary | ICD-10-CM | POA: Diagnosis not present

## 2015-04-01 DIAGNOSIS — I10 Essential (primary) hypertension: Secondary | ICD-10-CM | POA: Diagnosis present

## 2015-04-01 DIAGNOSIS — N939 Abnormal uterine and vaginal bleeding, unspecified: Secondary | ICD-10-CM | POA: Diagnosis not present

## 2015-04-01 DIAGNOSIS — E440 Moderate protein-calorie malnutrition: Secondary | ICD-10-CM | POA: Diagnosis present

## 2015-04-01 DIAGNOSIS — R509 Fever, unspecified: Secondary | ICD-10-CM

## 2015-04-01 DIAGNOSIS — R938 Abnormal findings on diagnostic imaging of other specified body structures: Secondary | ICD-10-CM | POA: Diagnosis not present

## 2015-04-01 DIAGNOSIS — R9389 Abnormal findings on diagnostic imaging of other specified body structures: Secondary | ICD-10-CM | POA: Insufficient documentation

## 2015-04-01 HISTORY — DX: Malignant (primary) neoplasm, unspecified: C80.1

## 2015-04-01 LAB — COMPREHENSIVE METABOLIC PANEL
ALT: 7 U/L — AB (ref 14–54)
AST: 17 U/L (ref 15–41)
Albumin: 3 g/dL — ABNORMAL LOW (ref 3.5–5.0)
Alkaline Phosphatase: 65 U/L (ref 38–126)
Anion gap: 10 (ref 5–15)
BUN: 14 mg/dL (ref 6–20)
CALCIUM: 9.2 mg/dL (ref 8.9–10.3)
CHLORIDE: 93 mmol/L — AB (ref 101–111)
CO2: 30 mmol/L (ref 22–32)
Creatinine, Ser: 0.51 mg/dL (ref 0.44–1.00)
GFR calc Af Amer: >60 mL/min (ref >60)
GFR calc non Af Amer: >60 mL/min (ref >60)
Glucose, Bld: 111 mg/dL — ABNORMAL HIGH (ref 65–99)
POTASSIUM: 4.3 mmol/L (ref 3.5–5.1)
Sodium: 133 mmol/L — ABNORMAL LOW (ref 135–145)
Total Bilirubin: 0.2 mg/dL — ABNORMAL LOW (ref 0.3–1.2)
Total Protein: 7.1 g/dL (ref 6.5–8.1)

## 2015-04-01 LAB — LACTIC ACID, PLASMA: LACTIC ACID, VENOUS: 1 mmol/L (ref 0.5–2.0)

## 2015-04-01 LAB — URINALYSIS COMPLETE WITH MICROSCOPIC (ARMC ONLY)
BILIRUBIN URINE: NEGATIVE
GLUCOSE, UA: NEGATIVE mg/dL
Nitrite: NEGATIVE
Protein, ur: 100 mg/dL — AB
SPECIFIC GRAVITY, URINE: 1.012 (ref 1.005–1.030)
Squamous Epithelial / LPF: NONE SEEN
pH: 7 (ref 5.0–8.0)

## 2015-04-01 LAB — CBC WITH DIFFERENTIAL/PLATELET
BASOS PCT: 1 %
Basophils Absolute: 0 10*3/uL (ref 0–0.1)
Eosinophils Absolute: 0 10*3/uL (ref 0–0.7)
Eosinophils Relative: 1 %
HCT: 30.9 % — ABNORMAL LOW (ref 35.0–47.0)
HEMOGLOBIN: 10.5 g/dL — AB (ref 12.0–16.0)
LYMPHS ABS: 1.6 10*3/uL (ref 1.0–3.6)
Lymphocytes Relative: 19 %
MCH: 34.1 pg — ABNORMAL HIGH (ref 26.0–34.0)
MCHC: 34 g/dL (ref 32.0–36.0)
MCV: 100.1 fL — AB (ref 80.0–100.0)
MONO ABS: 0.5 10*3/uL (ref 0.2–0.9)
Monocytes Relative: 6 %
NEUTROS ABS: 6.1 10*3/uL (ref 1.4–6.5)
Neutrophils Relative %: 73 %
Platelets: 455 10*3/uL — ABNORMAL HIGH (ref 150–440)
RBC: 3.09 MIL/uL — ABNORMAL LOW (ref 3.80–5.20)
RDW: 14.6 % — ABNORMAL HIGH (ref 11.5–14.5)
WBC: 8.3 10*3/uL (ref 3.6–11.0)

## 2015-04-01 LAB — BLOOD GAS, VENOUS
Acid-Base Excess: 8 mmol/L — ABNORMAL HIGH (ref 0.0–3.0)
BICARBONATE: 32.8 meq/L — AB (ref 21.0–28.0)
FIO2: 0.21 %
PATIENT TEMPERATURE: 37
PH VEN: 7.47 — AB (ref 7.320–7.430)
pCO2, Ven: 45 mmHg (ref 44.0–60.0)

## 2015-04-01 MED ORDER — ONDANSETRON HCL 4 MG PO TABS: 4.0000 mg | ORAL_TABLET | Freq: Four times a day (QID) | ORAL | Status: DC | PRN

## 2015-04-01 MED ORDER — SODIUM CHLORIDE 0.9 % IV SOLN
INTRAVENOUS | Status: DC
  Administered 2015-04-02 – 2015-04-05 (×7): via INTRAVENOUS

## 2015-04-01 MED ORDER — SODIUM CHLORIDE 0.9 % IV SOLN
1000.0000 mL | Freq: Once | INTRAVENOUS | Status: AC
  Administered 2015-04-01: 1000 mL via INTRAVENOUS

## 2015-04-01 MED ORDER — HEPARIN SODIUM (PORCINE) 5000 UNIT/ML IJ SOLN
5000.0000 [IU] | Freq: Three times a day (TID) | INTRAMUSCULAR | Status: DC
  Administered 2015-04-02 – 2015-04-05 (×10): 5000 [IU] via SUBCUTANEOUS
  Filled 2015-04-01 (×11): qty 1

## 2015-04-01 MED ORDER — PIPERACILLIN-TAZOBACTAM 3.375 G IVPB
INTRAVENOUS | Status: AC
  Filled 2015-04-01: qty 50

## 2015-04-01 MED ORDER — PIPERACILLIN-TAZOBACTAM 3.375 G IVPB
3.3750 g | Freq: Once | INTRAVENOUS | Status: AC
  Administered 2015-04-01: 3.375 g via INTRAVENOUS

## 2015-04-01 MED ORDER — ACETAMINOPHEN 325 MG PO TABS
650.0000 mg | ORAL_TABLET | Freq: Four times a day (QID) | ORAL | Status: DC | PRN
  Administered 2015-04-02: 650 mg via ORAL
  Filled 2015-04-01: qty 2

## 2015-04-01 MED ORDER — ONDANSETRON HCL 4 MG/2ML IJ SOLN: 4.0000 mg | Freq: Four times a day (QID) | INTRAMUSCULAR | Status: DC | PRN

## 2015-04-01 MED ORDER — SODIUM CHLORIDE 0.9 % IV SOLN
1000.0000 mL | INTRAVENOUS | Status: DC
  Administered 2015-04-01: 1000 mL via INTRAVENOUS

## 2015-04-01 MED ORDER — ACETAMINOPHEN 650 MG RE SUPP: 650.0000 mg | Freq: Four times a day (QID) | RECTAL | Status: DC | PRN

## 2015-04-01 MED ORDER — MORPHINE SULFATE 2 MG/ML IJ SOLN
2.0000 mg | INTRAMUSCULAR | Status: DC | PRN
  Filled 2015-04-01: qty 1

## 2015-04-01 MED ORDER — VANCOMYCIN HCL IN DEXTROSE 1-5 GM/200ML-% IV SOLN
INTRAVENOUS | Status: AC
  Filled 2015-04-01: qty 200

## 2015-04-01 MED ORDER — VANCOMYCIN HCL IN DEXTROSE 1-5 GM/200ML-% IV SOLN
1000.0000 mg | Freq: Once | INTRAVENOUS | Status: AC
  Administered 2015-04-01: 1000 mg via INTRAVENOUS

## 2015-04-01 MED ORDER — ACETAMINOPHEN 650 MG RE SUPP
650.0000 mg | Freq: Once | RECTAL | Status: AC
  Administered 2015-04-01: 650 mg via RECTAL

## 2015-04-01 NOTE — ED Notes (Signed)
Darrick Meigs [phone] niece will call and use the code: [email or web address]. So that she can check on her aunt.

## 2015-04-01 NOTE — ED Notes (Signed)
Pt presents w/ c/o fever, foul-smelling urine, n/v since yesterday. Pt is a resident of [facility].

## 2015-04-01 NOTE — H&P (Signed)
[facility] [place] NAME: Stacey Torres    MR#:  [id number]  DATE OF BIRTH:  [birth year]   DATE OF ADMISSION:  04/01/2015  PRIMARY CARE PHYSICIAN: Viviana Simpler, MD   REQUESTING/REFERRING PHYSICIAN: Owens Shark  CHIEF COMPLAINT:   Chief Complaint  Patient presents with  . Dysuria  . Fever    HISTORY OF PRESENT ILLNESS:  Stacey Torres  is a 81 y.o. female with a known history of rheumatoid arthritis, essential hypertension presenting from [facility] with fever. One day duration of fever, chills, foul urine without dysuria or change in urinary frequency. Of note has previously had ESBL Escherichia coli. Emergency department course: IV fluid hydration, broad-spectrum antibiotics Vanco/Zosyn  PAST MEDICAL HISTORY:   Past Medical History  Diagnosis Date  . Cerebral palsy, hemiplegic     pt is intelligant  . Neurologic gait dysfunction   . Spinal stenosis   . Urge urinary incontinence   . Hypertension   . Arthritis   . Neurogenic bladder   . Hydronephrosis, right   . Chronic cystitis   . Gross hematuria   . RA (rheumatoid arthritis)     advanced  . Flexion contracture joint of multiple sites     secondary to advanced ra  . Slurred speech     PAST SURGICAL HISTORY:   Past Surgical History  Procedure Laterality Date  . Total knee arthroplasty Right 2005  . Cholecystectomy  1995  . Cystoscopy with retrograde pyelogram, ureteroscopy and stent placement Bilateral 11/21/2013    Procedure: CYSTOSCOPY WITH RIGHT RETROGRADE PYELOGRAM, RIGHT FLEXIBLE and rigid URETEROSCOPY  PLACEMENT;  Surgeon: Bernestine Amass, MD;  Location: [facility];  Service: Urology;  Laterality: Bilateral;  . Cystogram  11/21/2013    Procedure: CYSTOGRAM;  Surgeon: Bernestine Amass, MD;  Location[facility];  Service: Urology;;    SOCIAL HISTORY:   History  Substance Use Topics  . Smoking  status: Former Smoker    Quit date: 11/18/1958  . Smokeless tobacco: Never Used  . Alcohol Use: No    FAMILY HISTORY:   Family History  Problem Relation Age of Onset  . CAD Other     DRUG ALLERGIES:  No Known Allergies  REVIEW OF SYSTEMS:  REVIEW OF SYSTEMS:  CONSTITUTIONAL: Positive for fevers, chills, fatigue, weakness.  EYES: Denies blurred vision, double vision, or eye pain.  EARS, NOSE, THROAT: Denies tinnitus, ear pain, hearing loss.  RESPIRATORY: denies cough, shortness of breath, wheezing  CARDIOVASCULAR: Denies chest pain, palpitations, edema.  GASTROINTESTINAL: Positive nausea, vomiting, denies diarrhea, abdominal pain.  GENITOURINARY: Denies dysuria, hematuria. Positive for foul urine ENDOCRINE: Denies nocturia or thyroid problems. HEMATOLOGIC AND LYMPHATIC: Denies easy bruising or bleeding.  SKIN: Denies rash or lesions.  MUSCULOSKELETAL: Denies pain in neck, back, shoulder, knees, hips, or further arthritic symptoms.  NEUROLOGIC: Denies paralysis, paresthesias.  PSYCHIATRIC: Denies anxiety or depressive symptoms. Otherwise full review of systems performed by me is negative.   MEDICATIONS AT HOME:   Prior to Admission medications   Medication Sig Start Date End Date Taking? Authorizing Provider  acetaminophen (TYLENOL) 650 MG CR tablet Take 650 mg by mouth 2 (two) times daily as needed for pain.   Yes Historical Provider, MD  acetaminophen (TYLENOL) 650 MG CR tablet Take 650 mg by mouth every 8 (eight) hours as needed for pain.   Yes Historical Provider, MD  ALPRAZolam Duanne Moron) 0.25 MG tablet Take 0.25 mg  by mouth 2 (two) times daily as needed.    Yes Historical Provider, MD  ALPRAZolam (XANAX) 0.25 MG tablet Take 0.25 mg by mouth 3 (three) times daily.   Yes Historical Provider, MD  ALPRAZolam (XANAX) 0.25 MG tablet Take 0.25 mg by mouth 2 (two) times daily.   Yes Historical Provider, MD  Ascorbic Acid (VITAMIN C) 1000 MG tablet Take 1,000 mg by mouth daily.    Yes Historical Provider, MD  benzonatate (TESSALON) 100 MG capsule Take 200 mg by mouth 3 (three) times daily as needed for cough.   Yes Historical Provider, MD  Calcium Carbonate-Vitamin D (CALCIUM 600+D) 600-400 MG-UNIT per tablet Take 1 tablet by mouth every evening.   Yes Historical Provider, MD  desonide (DESOWEN) 0.05 % cream Apply topically.   Yes Historical Provider, MD  diphenhydrAMINE (SOMINEX) 25 MG tablet Take 25 mg by mouth 2 (two) times daily as needed for sleep.   Yes Historical Provider, MD  DULoxetine (CYMBALTA) 60 MG capsule Take 60 mg by mouth every morning.   Yes Historical Provider, MD  Emollient (CERAVE) LOTN Apply topically.   Yes Historical Provider, MD  folic acid (FOLVITE) 1 MG tablet Take 1 mg by mouth daily.   Yes Historical Provider, MD  hydroxychloroquine (PLAQUENIL) 200 MG tablet Take 200 mg by mouth 2 (two) times daily.   Yes Historical Provider, MD  hydroxypropyl methylcellulose / hypromellose (ISOPTO TEARS / GONIOVISC) 2.5 % ophthalmic solution Place 2 drops into both eyes 2 (two) times daily as needed for dry eyes.   Yes Historical Provider, MD  ipratropium (ATROVENT) 0.03 % nasal spray Place 2 sprays into both nostrils 2 (two) times daily.   Yes Historical Provider, MD  ketoconazole (NIZORAL) 2 % cream Apply 1 application topically 2 (two) times daily.   Yes Historical Provider, MD  loratadine (CLARITIN) 10 MG tablet Take 10 mg by mouth every morning.    Yes Historical Provider, MD  magnesium hydroxide (MILK OF MAGNESIA) 400 MG/5ML suspension Take 30 mLs by mouth 2 (two) times daily as needed for mild constipation.    Yes Historical Provider, MD  methotrexate (RHEUMATREX) 2.5 MG tablet Take 2.5 mg by mouth once a week. Caution:Chemotherapy. Protect from light.   Yes Historical Provider, MD  metoprolol succinate (TOPROL-XL) 50 MG 24 hr tablet Take 50 mg by mouth 2 (two) times daily. Take with or immediately following a meal.   Yes Historical Provider, MD   ondansetron (ZOFRAN) 4 MG tablet Take 4 mg by mouth every 4 (four) hours as needed for nausea or vomiting.    Yes Historical Provider, MD  polyethylene glycol (MIRALAX / GLYCOLAX) packet Take 17 g by mouth at bedtime.   Yes Historical Provider, MD  primidone (MYSOLINE) 50 MG tablet Take 50 mg by mouth 2 (two) times daily.    Yes Historical Provider, MD  senna-docusate (SENOKOT-S) 8.6-50 MG per tablet Take 2 tablets by mouth 2 (two) times daily as needed for mild constipation.   Yes Historical Provider, MD  sennosides-docusate sodium (SENOKOT-S) 8.6-50 MG tablet Take 1 tablet by mouth at bedtime.   Yes Historical Provider, MD  therapeutic multivitamin-minerals (THERAGRAN-M) tablet Take 1 tablet by mouth every morning.   Yes Historical Provider, MD  traMADol (ULTRAM) 50 MG tablet Take by mouth every 8 (eight) hours as needed.    Yes Historical Provider, MD  traZODone (DESYREL) 50 MG tablet Take 50 mg by mouth at bedtime.   Yes Historical Provider, MD  zinc oxide 20 %  ointment Apply 1 application topically as needed for irritation.   Yes Historical Provider, MD  dextromethorphan 15 MG/5ML syrup Take 10 mLs by mouth 4 (four) times daily as needed for cough.    Historical Provider, MD  gabapentin (NEURONTIN) 100 MG capsule Take 100 mg by mouth 2 (two) times daily.    Historical Provider, MD      VITAL SIGNS:  Blood pressure 136/119, pulse 101, temperature 98.3 F (36.8 C), temperature source Oral, resp. rate 21, weight 124 lb (56.246 kg), SpO2 97 %.  PHYSICAL EXAMINATION:  VITAL SIGNS: Filed Vitals:   04/01/15 2230  BP: 136/119  Pulse: 101  Temp:   Resp: 21   GENERAL:81 y.o.female chronically ill-appearing HEAD: Normocephalic, atraumatic.  EYES: Pupils equal, round, reactive to light. Extraocular muscles intact. No scleral icterus.  MOUTH: Dry mucosal membrane. Dentition poor. No abscess noted.  EAR, NOSE, THROAT: Clear without exudates. No external lesions.  NECK: Supple. No  thyromegaly. No nodules. No JVD.  PULMONARY: Clear to ascultation, without wheeze rails or rhonci. No use of accessory muscles, Good respiratory effort. good air entry bilaterally CHEST: Nontender to palpation.  CARDIOVASCULAR: S1 and S2. Tachycardic. No murmurs, rubs, or gallops. No edema. Pedal pulses 2+ bilaterally.  GASTROINTESTINAL: Soft, nontender, nondistended. No masses. Positive bowel sounds. No hepatosplenomegaly.  MUSCULOSKELETAL: No swelling, clubbing, or edema. Range of motion limited in the lower extremities secondary to contracture  NEUROLOGIC: Cranial nerves II through XII are intact. No gross focal neurological deficits. Sensation intact. Reflexes intact.  SKIN: No ulceration, lesions, rashes, or cyanosis. Skin warm and dry. Turgor intact.  PSYCHIATRIC: Mood, affect within normal limits. The patient is awake, alert and oriented x 3. Insight, judgment intact.    LABORATORY PANEL:   CBC  Recent Labs Lab 04/01/15 2020  WBC 8.3  HGB 10.5*  HCT 30.9*  PLT 455*   ------------------------------------------------------------------------------------------------------------------  Chemistries   Recent Labs Lab 04/01/15 2020  NA 133*  K 4.3  CL 93*  CO2 30  GLUCOSE 111*  BUN 14  CREATININE 0.51  CALCIUM 9.2  AST 17  ALT 7*  ALKPHOS 65  BILITOT 0.2*   ------------------------------------------------------------------------------------------------------------------  Cardiac Enzymes No results for input(s): TROPONINI in the last 168 hours. ------------------------------------------------------------------------------------------------------------------  RADIOLOGY:  Dg Chest Port 1 View  04/01/2015   CLINICAL DATA:  Nausea and vomiting, fever beginning yesterday. History of spinal stenosis, cerebral palsy.  EXAM: PORTABLE CHEST - 1 VIEW  COMPARISON:  None available for comparison at time of study interpretation.  FINDINGS: Elevated LEFT hemidiaphragm. LEFT lung  base strandy densities. No pleural effusion or focal consolidation. Cardiac silhouette appears normal in size. Calcified aorta. Patient is rotated local RIGHT. No pneumothorax. Severe degenerative change of the RIGHT shoulder. T12 severe compression fracture, incompletely imaged.  IMPRESSION: Elevated LEFT hemidiaphragm with atelectasis.  Severe T12 compression fracture incompletely imaged.   Electronically Signed   By: Elon Alas   On: 04/01/2015 21:10    EKG:   Orders placed or performed during the hospital encounter of 11/21/13  . EKG 12 lead  . EKG 12 lead  . EKG    IMPRESSION AND PLAN:   81 year old Caucasian female history of rheumatoid arthritis, essential hypertension presenting one-day duration fever.  1. Sepsis, meeting septic criteria by temperature, respiratory rate, heart rate present on arrival. Source urinary. Panculture. Broad-spectrum antibiotics including vancomycin/Zosyn given in emergency department however will cover with meropenem given history of ESBL Escherichia coli. and taper antibiotics when culture data returns.Continue IV fluid  hydration to keep mean arterial pressure greater than 65.   2. Essential hypertension, metoprolol 3. Rheumatoid arthritis: Plaquenil 4.Venous thromboembolism prophylactic heparin subcutaneous   All the records are reviewed and case discussed with ED provider. Management plans discussed with the patient, family and they are in agreement.  CODE STATUS: DO NOT RESUSCITATE  TOTAL TIME TAKING CARE OF THIS PATIENT: 45 minutes.    Hower,  Karenann Cai.D on 04/01/2015 at 11:10 PM  Between 7am to 6pm - Pager - [phone]  After 6pm: House Pager: - [facility]  Office  [phone]  CC: Primary care physician; Viviana Simpler, MD

## 2015-04-01 NOTE — ED Provider Notes (Signed)
[facility] Emergency Department Provider Note  ____________________________________________  Time seen: 8:00 PM  I have reviewed the triage vital signs and the nursing notes.   HISTORY  Chief Complaint Dysuria and Fever      HPI Stacey Torres is a 81 y.o. female presents with foul-smelling urine and fever times one day. Patient is a resident at [facility] history was conveyed to EMS by the nursing staff there.     Past Medical History  Diagnosis Date  . Cerebral palsy, hemiplegic     pt is intelligant  . Neurologic gait dysfunction   . Spinal stenosis   . Urge urinary incontinence   . Hypertension   . Arthritis   . Neurogenic bladder   . Hydronephrosis, right   . Chronic cystitis   . Gross hematuria   . RA (rheumatoid arthritis)     advanced  . Flexion contracture joint of multiple sites     secondary to advanced ra  . Slurred speech     Patient Active Problem List   Diagnosis Date Noted  . Hydronephrosis of right kidney 11/21/2013  . Gross hematuria 11/21/2013    Past Surgical History  Procedure Laterality Date  . Total knee arthroplasty Right 2005  . Cholecystectomy  1995  . Cystoscopy with retrograde pyelogram, ureteroscopy and stent placement Bilateral 11/21/2013    Procedure: CYSTOSCOPY WITH RIGHT RETROGRADE PYELOGRAM, RIGHT FLEXIBLE and rigid URETEROSCOPY  PLACEMENT;  Surgeon: Bernestine Amass, MD;  Location: [facility];  Service: Urology;  Laterality: Bilateral;  . Cystogram  11/21/2013    Procedure: CYSTOGRAM;  Surgeon: Bernestine Amass, MD;  Location: [facility];  Service: Urology;;    Current Outpatient Rx  Name  Route  Sig  Dispense  Refill  . acetaminophen (TYLENOL) 650 MG CR tablet   Oral   Take 650 mg by mouth 2 (two) times daily as needed for pain.         Marland Kitchen ALPRAZolam (XANAX) 0.25 MG tablet   Oral   Take 0.25 mg by mouth 2 (two) times daily.         . Calcium  Carbonate-Vitamin D (CALCIUM 600+D) 600-400 MG-UNIT per tablet   Oral   Take 1 tablet by mouth every evening.         Marland Kitchen dextromethorphan 15 MG/5ML syrup   Oral   Take 10 mLs by mouth 4 (four) times daily as needed for cough.         . DULoxetine (CYMBALTA) 60 MG capsule   Oral   Take 60 mg by mouth every morning.         . gabapentin (NEURONTIN) 100 MG capsule   Oral   Take 100 mg by mouth 2 (two) times daily.         . hydroxychloroquine (PLAQUENIL) 200 MG tablet   Oral   Take 200 mg by mouth 2 (two) times daily.         Marland Kitchen ipratropium (ATROVENT) 0.03 % nasal spray   Each Nare   Place 2 sprays into both nostrils 2 (two) times daily.         Marland Kitchen loratadine (CLARITIN) 10 MG tablet   Oral   Take 10 mg by mouth every morning.          . magnesium hydroxide (MILK OF MAGNESIA) 400 MG/5ML suspension   Oral   Take by mouth daily as needed for mild constipation.         Marland Kitchen  metoprolol succinate (TOPROL-XL) 50 MG 24 hr tablet   Oral   Take 50 mg by mouth 2 (two) times daily. Take with or immediately following a meal.         . ondansetron (ZOFRAN) 4 MG tablet   Oral   Take 4 mg by mouth every 8 (eight) hours as needed for nausea or vomiting.         . primidone (MYSOLINE) 50 MG tablet   Oral   Take 25 mg by mouth 2 (two) times daily.         . sennosides-docusate sodium (SENOKOT-S) 8.6-50 MG tablet   Oral   Take 1 tablet by mouth at bedtime.         Marland Kitchen therapeutic multivitamin-minerals (THERAGRAN-M) tablet   Oral   Take 1 tablet by mouth every morning.         . traMADol (ULTRAM) 50 MG tablet   Oral   Take by mouth every 6 (six) hours as needed.         . traZODone (DESYREL) 50 MG tablet   Oral   Take 50 mg by mouth at bedtime.           Allergies Review of patient's allergies indicates no known allergies.  History reviewed. No pertinent family history.  Social History History  Substance Use Topics  . Smoking status: Former Smoker     Quit date: 11/18/1958  . Smokeless tobacco: Never Used  . Alcohol Use: No    Review of Systems  Constitutional: Positive for fever. Eyes: Negative for visual changes. ENT: Negative for sore throat. Cardiovascular: Negative for chest pain. Respiratory: Negative for shortness of breath. Gastrointestinal: Negative for abdominal pain, vomiting and diarrhea. Genitourinary: Negative for dysuria. Musculoskeletal: Negative for back pain. Skin: Negative for rash. Neurological: Negative for headaches, focal weakness or numbness.   10-point ROS otherwise negative.  ____________________________________________   PHYSICAL EXAM:  VITAL SIGNS: ED Triage Vitals  Enc Vitals Group     BP 04/01/15 2004 127/83 mmHg     Pulse Rate 04/01/15 2004 111     Resp 04/01/15 2004 20     Temp 04/01/15 2004 102.7 F (39.3 C)     Temp Source 04/01/15 2004 Rectal     SpO2 04/01/15 2001 96 %     Weight 04/01/15 2004 124 lb (56.246 kg)     Height --      Head Cir --      Peak Flow --      Pain Score --      Pain Loc --      Pain Edu? --      Excl. in [place]? --      Constitutional: Alert and oriented. Well appearing and in no distress. Eyes: Conjunctivae are normal. PERRL. Normal extraocular movements. ENT   Head: Normocephalic and atraumatic.   Nose: No congestion/rhinnorhea.   Mouth/Throat: Mucous membranes are moist.   Neck: No stridor. Cardiovascular: Normal rate, regular rhythm. Normal and symmetric distal pulses are present in all extremities. No murmurs, rubs, or gallops. Respiratory: Normal respiratory effort without tachypnea nor retractions. Breath sounds are clear and equal bilaterally. No wheezes/rales/rhonchi. Gastrointestinal: Soft and nontender. No distention. There is no CVA tenderness. Genitourinary: deferred Musculoskeletal: Nontender with normal range of motion in all extremities. No joint effusions.  No lower extremity tenderness nor edema. Neurologic:  Normal  speech and language. No gross focal neurologic deficits are appreciated. Speech is normal.  Skin:  Skin is warm, dry  and intact. No rash noted. Psychiatric: Mood and affect are normal. Speech and behavior are normal. Patient exhibits appropriate insight and judgment.  ____________________________________________    LABS (pertinent positives/negatives)  Labs Reviewed  CBC WITH DIFFERENTIAL/PLATELET - Abnormal; Notable for the following:    RBC 3.09 (*)    Hemoglobin 10.5 (*)    HCT 30.9 (*)    MCV 100.1 (*)    MCH 34.1 (*)    RDW 14.6 (*)    Platelets 455 (*)    All other components within normal limits  COMPREHENSIVE METABOLIC PANEL - Abnormal; Notable for the following:    Sodium 133 (*)    Chloride 93 (*)    Glucose, Bld 111 (*)    Albumin 3.0 (*)    ALT 7 (*)    Total Bilirubin 0.2 (*)    All other components within normal limits  URINALYSIS COMPLETEWITH MICROSCOPIC ([facility])  - Abnormal; Notable for the following:    Color, Urine AMBER (*)    APPearance TURBID (*)    Ketones, ur TRACE (*)    Hgb urine dipstick 1+ (*)    Protein, ur 100 (*)    Leukocytes, UA 3+ (*)    Bacteria, UA MANY (*)    All other components within normal limits  BLOOD GAS, VENOUS - Abnormal; Notable for the following:    pH, Ven 7.47 (*)    Bicarbonate 32.8 (*)    Acid-Base Excess 8.0 (*)    All other components within normal limits  CULTURE, BLOOD (ROUTINE X 2)  CULTURE, BLOOD (ROUTINE X 2)  URINE CULTURE  LACTIC ACID, PLASMA     ____________________________________________        Critical Care performed: 60 minutes  ____________________________________________   INITIAL IMPRESSION / ASSESSMENT AND PLAN / ED COURSE  Pertinent labs & imaging results that were available during my care of the patient were reviewed by me and considered in my medical decision making (see chart for details).  History of physical exam consistent with sepsis. Patient tachycardic febrile with known  source urinary tract infection. Patient received 30 MLS per kilogram normal saline IV bolus. IV vancomycin and Zosyn.  ____________________________________________   FINAL CLINICAL IMPRESSION(S) / ED DIAGNOSES  Final diagnoses:  Sepsis, due to unspecified organism  Urinary tract infection, acute       Gregor Hams, MD 04/01/15 2142

## 2015-04-02 ENCOUNTER — Telehealth: Payer: Self-pay

## 2015-04-02 DIAGNOSIS — E440 Moderate protein-calorie malnutrition: Secondary | ICD-10-CM | POA: Insufficient documentation

## 2015-04-02 LAB — CBC
HCT: 25.9 % — ABNORMAL LOW (ref 35.0–47.0)
Hemoglobin: 8.5 g/dL — ABNORMAL LOW (ref 12.0–16.0)
MCH: 32.9 pg (ref 26.0–34.0)
MCHC: 32.8 g/dL (ref 32.0–36.0)
MCV: 100.3 fL — ABNORMAL HIGH (ref 80.0–100.0)
Platelets: 331 10*3/uL (ref 150–440)
RBC: 2.59 MIL/uL — ABNORMAL LOW (ref 3.80–5.20)
RDW: 14.7 % — AB (ref 11.5–14.5)
WBC: 6.2 10*3/uL (ref 3.6–11.0)

## 2015-04-02 LAB — BASIC METABOLIC PANEL
Anion gap: 5 (ref 5–15)
BUN: 10 mg/dL (ref 6–20)
CALCIUM: 7.9 mg/dL — AB (ref 8.9–10.3)
CHLORIDE: 106 mmol/L (ref 101–111)
CO2: 28 mmol/L (ref 22–32)
CREATININE: 0.36 mg/dL — AB (ref 0.44–1.00)
GFR calc non Af Amer: >60 mL/min (ref >60)
Glucose, Bld: 87 mg/dL (ref 65–99)
Potassium: 3.4 mmol/L — ABNORMAL LOW (ref 3.5–5.1)
SODIUM: 139 mmol/L (ref 135–145)

## 2015-04-02 LAB — MRSA PCR SCREENING: MRSA BY PCR: NEGATIVE

## 2015-04-02 MED ORDER — ENSURE ENLIVE PO LIQD
237.0000 mL | Freq: Two times a day (BID) | ORAL | Status: DC
  Administered 2015-04-02 – 2015-04-05 (×5): 237 mL via ORAL

## 2015-04-02 MED ORDER — SODIUM CHLORIDE 0.9 % IV SOLN
1.0000 g | Freq: Two times a day (BID) | INTRAVENOUS | Status: DC
  Administered 2015-04-02 – 2015-04-04 (×6): 1 g via INTRAVENOUS
  Filled 2015-04-02 (×8): qty 1

## 2015-04-02 MED ORDER — METOPROLOL SUCCINATE ER 50 MG PO TB24
50.0000 mg | ORAL_TABLET | Freq: Two times a day (BID) | ORAL | Status: DC
  Administered 2015-04-02 – 2015-04-05 (×8): 50 mg via ORAL
  Filled 2015-04-02 (×8): qty 1

## 2015-04-02 MED ORDER — SENNOSIDES-DOCUSATE SODIUM 8.6-50 MG PO TABS: 2.0000 | ORAL_TABLET | Freq: Two times a day (BID) | ORAL | Status: DC | PRN

## 2015-04-02 MED ORDER — HYPROMELLOSE (GONIOSCOPIC) 2.5 % OP SOLN
2.0000 [drp] | Freq: Two times a day (BID) | OPHTHALMIC | Status: DC | PRN
  Filled 2015-04-02: qty 15

## 2015-04-02 MED ORDER — ALPRAZOLAM 0.25 MG PO TABS
0.2500 mg | ORAL_TABLET | Freq: Three times a day (TID) | ORAL | Status: DC
  Administered 2015-04-02 – 2015-04-05 (×10): 0.25 mg via ORAL
  Filled 2015-04-02 (×11): qty 1

## 2015-04-02 MED ORDER — TRAZODONE HCL 50 MG PO TABS
50.0000 mg | ORAL_TABLET | Freq: Every day | ORAL | Status: DC
  Administered 2015-04-02 – 2015-04-04 (×4): 50 mg via ORAL
  Filled 2015-04-02: qty 13
  Filled 2015-04-02 (×3): qty 1

## 2015-04-02 MED ORDER — IPRATROPIUM BROMIDE 0.03 % NA SOLN
2.0000 | Freq: Two times a day (BID) | NASAL | Status: DC
  Administered 2015-04-02 – 2015-04-05 (×6): 2 via NASAL
  Filled 2015-04-02: qty 30

## 2015-04-02 MED ORDER — LORATADINE 10 MG PO TABS
10.0000 mg | ORAL_TABLET | Freq: Every morning | ORAL | Status: DC
  Administered 2015-04-02 – 2015-04-05 (×4): 10 mg via ORAL
  Filled 2015-04-02 (×4): qty 1

## 2015-04-02 MED ORDER — MAGNESIUM HYDROXIDE 400 MG/5ML PO SUSP: 30.0000 mL | Freq: Two times a day (BID) | ORAL | Status: DC | PRN

## 2015-04-02 MED ORDER — CALCIUM CARBONATE-VITAMIN D 500-200 MG-UNIT PO TABS
1.0000 | ORAL_TABLET | Freq: Every evening | ORAL | Status: DC
  Administered 2015-04-02 – 2015-04-04 (×3): 1 via ORAL
  Filled 2015-04-02 (×4): qty 1

## 2015-04-02 MED ORDER — VITAMIN C 500 MG PO TABS
1000.0000 mg | ORAL_TABLET | Freq: Every day | ORAL | Status: DC
  Administered 2015-04-02 – 2015-04-05 (×4): 1000 mg via ORAL
  Filled 2015-04-02 (×3): qty 2

## 2015-04-02 MED ORDER — DIPHENHYDRAMINE HCL (SLEEP) 25 MG PO TABS: 25.0000 mg | ORAL_TABLET | Freq: Two times a day (BID) | ORAL | Status: DC | PRN

## 2015-04-02 MED ORDER — CALCIUM CARBONATE-VITAMIN D 600-400 MG-UNIT PO TABS: 1.0000 | ORAL_TABLET | Freq: Every evening | ORAL | Status: DC

## 2015-04-02 MED ORDER — POLYETHYLENE GLYCOL 3350 17 G PO PACK
17.0000 g | PACK | Freq: Every day | ORAL | Status: DC
  Administered 2015-04-02 – 2015-04-04 (×3): 17 g via ORAL
  Filled 2015-04-02 (×4): qty 1

## 2015-04-02 MED ORDER — FOLIC ACID 1 MG PO TABS
1.0000 mg | ORAL_TABLET | Freq: Every day | ORAL | Status: DC
  Administered 2015-04-02 – 2015-04-05 (×4): 1 mg via ORAL
  Filled 2015-04-02 (×4): qty 1

## 2015-04-02 MED ORDER — GABAPENTIN 100 MG PO CAPS
100.0000 mg | ORAL_CAPSULE | Freq: Two times a day (BID) | ORAL | Status: DC
  Administered 2015-04-02 – 2015-04-05 (×8): 100 mg via ORAL
  Filled 2015-04-02 (×8): qty 1

## 2015-04-02 MED ORDER — DULOXETINE HCL 60 MG PO CPEP
60.0000 mg | ORAL_CAPSULE | Freq: Every morning | ORAL | Status: DC
  Administered 2015-04-02 – 2015-04-05 (×4): 60 mg via ORAL
  Filled 2015-04-02 (×4): qty 1

## 2015-04-02 MED ORDER — HYDROXYCHLOROQUINE SULFATE 200 MG PO TABS
200.0000 mg | ORAL_TABLET | Freq: Two times a day (BID) | ORAL | Status: DC
  Administered 2015-04-02 – 2015-04-05 (×7): 200 mg via ORAL
  Filled 2015-04-02 (×9): qty 1

## 2015-04-02 MED ORDER — POTASSIUM CHLORIDE 20 MEQ PO PACK
40.0000 meq | PACK | Freq: Once | ORAL | Status: AC
  Administered 2015-04-02: 40 meq via ORAL
  Filled 2015-04-02: qty 2

## 2015-04-02 NOTE — Progress Notes (Addendum)
[facility] [place] NAME: Stacey Torres    MR#:  [id number]  DATE OF BIRTH:  Sep 08, 1929  SUBJECTIVE:  CHIEF COMPLAINT:  Fever and dysuria Resting comfortably, feeling better. Denies any nausea or vomiting  REVIEW OF SYSTEMS:  CONSTITUTIONAL: No fever, fatigue or weakness.  EYES: No blurred or double vision.  EARS, NOSE, AND THROAT: No tinnitus or ear pain.  RESPIRATORY: No cough, shortness of breath, wheezing or hemoptysis.  CARDIOVASCULAR: No chest pain, orthopnea, edema.  GASTROINTESTINAL: No nausea, vomiting, diarrhea or abdominal pain.  GENITOURINARY: No dysuria, hematuria.  ENDOCRINE: No polyuria, nocturia,  HEMATOLOGY: No anemia, easy bruising or bleeding SKIN: No rash or lesion. MUSCULOSKELETAL: No joint pain or arthritis.   NEUROLOGIC: No tingling, numbness, weakness.  PSYCHIATRY: No anxiety or depression.   DRUG ALLERGIES:  No Known Allergies  VITALS:  Blood pressure 127/62, pulse 75, temperature 97.4 F (36.3 C), temperature source Oral, resp. rate 16, height 5\' 3"  (1.6 m), weight 51.619 kg (113 lb 12.8 oz), SpO2 100 %.  PHYSICAL EXAMINATION:  GENERAL:  81 y.o.-year-old patient lying in the bed with no acute distress.  EYES: Pupils equal, round, reactive to light and accommodation. No scleral icterus. Extraocular muscles intact.  HEENT: Head atraumatic, normocephalic. Oropharynx and nasopharynx clear.  NECK:  Supple, no jugular venous distention. No thyroid enlargement, no tenderness.  LUNGS: Normal breath sounds bilaterally, no wheezing, rales,rhonchi or crepitation. No use of accessory muscles of respiration.  CARDIOVASCULAR: S1, S2 normal. No murmurs, rubs, or gallops.  ABDOMEN: Soft, nontender, nondistended. Bowel sounds present. No organomegaly or mass.  EXTREMITIES: No pedal edema, cyanosis, or clubbing.  NEUROLOGIC: Cranial nerves II through XII are intact. Muscle strength 5/5 in all extremities.  Sensation intact. Gait not checked.  PSYCHIATRIC: The patient is alert and oriented x 3.  SKIN: No obvious rash, lesion, or ulcer.    LABORATORY PANEL:   CBC  Recent Labs Lab 04/02/15 0600  WBC 6.2  HGB 8.5*  HCT 25.9*  PLT 331   ------------------------------------------------------------------------------------------------------------------  Chemistries   Recent Labs Lab 04/01/15 2020 04/02/15 0600  NA 133* 139  K 4.3 3.4*  CL 93* 106  CO2 30 28  GLUCOSE 111* 87  BUN 14 10  CREATININE 0.51 0.36*  CALCIUM 9.2 7.9*  AST 17  --   ALT 7*  --   ALKPHOS 65  --   BILITOT 0.2*  --    ------------------------------------------------------------------------------------------------------------------  Cardiac Enzymes No results for input(s): TROPONINI in the last 168 hours. ------------------------------------------------------------------------------------------------------------------  RADIOLOGY:  Dg Chest Port 1 View  04/01/2015   CLINICAL DATA:  Nausea and vomiting, fever beginning yesterday. History of spinal stenosis, cerebral palsy.  EXAM: PORTABLE CHEST - 1 VIEW  COMPARISON:  None available for comparison at time of study interpretation.  FINDINGS: Elevated LEFT hemidiaphragm. LEFT lung base strandy densities. No pleural effusion or focal consolidation. Cardiac silhouette appears normal in size. Calcified aorta. Patient is rotated local RIGHT. No pneumothorax. Severe degenerative change of the RIGHT shoulder. T12 severe compression fracture, incompletely imaged.  IMPRESSION: Elevated LEFT hemidiaphragm with atelectasis.  Severe T12 compression fracture incompletely imaged.   Electronically Signed   By: Elon Alas   On: 04/01/2015 21:10    EKG:   Orders placed or performed during the hospital encounter of 11/21/13  . EKG 12 lead  . EKG 12 lead  . EKG    ASSESSMENT AND PLAN:    81 year old Caucasian female history of  rheumatoid arthritis, essential  hypertension presenting one-day duration fever.  1. Sepsis, meeting septic criteria by temperature, respiratory rate, heart rate present on arrival. Secondary to acute cystitis . Panculture.  Broad-spectrum antibiotics including vancomycin/Zosyn given in emergency department however will cover with meropenem given history of ESBL Escherichia coli. and taper antibiotics when culture data returns. Continue IV fluid hydration to keep mean arterial pressure greater than 65.   2. Essential hypertension, metoprolol  3. Rheumatoid arthritis: Plaquenil  4. Hypokalemia-replace and check the BMP in a.m.  5.Venous thromboembolism prophylactic heparin subcutaneous  6.Generalized weakness with history of cerebral palsy and hemiplegia-PT consult placed for evaluation and treatment as tolerated only  7. Vaginal bleeding /infection - Ob/gyn consult, am labs    All the records are reviewed and case discussed with Care Management/Social Workerr. Management plans discussed with the patient, family and they are in agreement.  CODE STATUS: DO NOT RESUSCITATE  TOTAL TIME TAKING CARE OF THIS PATIENT: 35 minutes.   POSSIBLE D/C IN 2-3 DAYS, DEPENDING ON CLINICAL CONDITION.   Nicholes Mango M.D on 04/02/2015 at 3:03 PM  Between 7am to 6pm - Pager - [phone] After 6pm go to [email or web address] - password [email or web address] [place] [facility]  Office  [phone]  CC: Primary care physician; Viviana Simpler, MD

## 2015-04-02 NOTE — Clinical Social Work Note (Signed)
Clinical Social Work Assessment  Patient Details  Name: Stacey Torres MRN: [id number] Date of Birth: 1929-03-13  Date of referral:  04/02/15               Reason for consult:  Facility Placement                Permission sought to share information with:  Facility Art therapist granted to share information::  Yes, Verbal Permission Granted  Name::        Agency::     Relationship::     Contact Information:     Housing/Transportation Living arrangements for the past 2 months:  [place] of Information:  Patient Patient Interpreter Needed:  None Criminal Activity/Legal Involvement Pertinent to Current Situation/Hospitalization:  No - Comment as needed Significant Relationships:    Lives with:    Do you feel safe going back to the place where you live?  Yes Need for family participation in patient care:  No (Coment)  Care giving concerns:  None    Facilities manager / plan:  CSW spoke with patient this afternoon who stated that she has been a resident at [facility] facility for 3 years. Patient states she is happy there and wishes to return at discharge. Seth Bake at [facility] confirms that they are able to take patient back as she is one of their long term residents. Patient is looking forward to feeling well enough to discharge back to [facility]. Fl2 completed and placed on chart.   Employment status:  Retired Forensic scientist:  Medicare PT Recommendations:  Not assessed at this time Information / Referral to community resources:     Patient/Family's Response to care:  In agreement with return to [facility] when time.  Patient/Family's Understanding of and Emotional Response to Diagnosis, Current Treatment, and Prognosis:  Patient verbalizes understanding.   Emotional Assessment Appearance:  Appears stated age Attitude/Demeanor/Rapport:   (pleasant and cooperative ) Affect (typically observed):  Accepting,  Appropriate Orientation:  Oriented to Self, Oriented to Place, Oriented to  Time, Oriented to Situation Alcohol / Substance use:  Not Applicable Psych involvement (Current and /or in the community):  No (Comment)  Discharge Needs  Concerns to be addressed:  No discharge needs identified Readmission within the last 30 days:  No Current discharge risk:  None Barriers to Discharge:  No Barriers Identified   Shela Leff, LCSW 04/02/2015, 2:37 PM

## 2015-04-02 NOTE — Telephone Encounter (Signed)
Has had recurrent ESBL infection  Will check on her at [facility]

## 2015-04-02 NOTE — Care Management (Signed)
Patient is from  [facility] and plan is for return there when stable for discharge. CSW is following

## 2015-04-02 NOTE — Telephone Encounter (Signed)
PLEASE NOTE: All timestamps contained within this report are represented as [place] Standard Time. CONFIDENTIALTY NOTICE: This fax transmission is intended only for the addressee. It contains information that is legally privileged, confidential or otherwise protected from use or disclosure. If you are not the intended recipient, you are strictly prohibited from reviewing, disclosing, copying using or disseminating any of this information or taking any action in reliance on or regarding this information. If you have received this fax in error, please notify us immediately by telephone so that we can arrange for its return to [place]. Phone: [phone], Toll-Free: [phone], Fax: [phone] Page: 1 of 1 Call Id: [id number] [place] Patient Name: Stacey Torres Gender: Female DOB: [birth year] Age: 81 Y 62 M 14 D Return Phone Number: [phone] (Primary) Address: City/State/Zip: [place] [address] Client Section Primary Care [place] Night - Client Client Site [place] Physician Viviana Simpler Contact Type Call Call Type Page Only Caller Name Stacey [place] RN with [facility] Relationship To Patient Provider Is this call to report lab results? No Return Phone Number [phone] (Primary) Initial Comment Caller states the resident has a fever of 101.8 and foul smelling urine. CB# [phone] Nurse Assessment Guidelines Guideline Title Affirmed Question Affirmed Notes Nurse Date/Time Stacey Torres Time) Disp. Time Stacey Torres Time) Disposition Final User 04/01/2015 6:44:42 PM Send to [place] 04/01/2015 6:55:01 PM Paged On Call back to Call [place] 04/01/2015 6:58:58 PM Page Completed Yes Jinger Neighbors After Care Instructions Given Call Event Type User Date / Time Description Paging [facility] Phone DateTime  Result/Outcome Message Type Notes Stacey Torres [id number] 04/01/2015 6:55:01 PM Paged On Call Back to Call Center Doctor Paged Message from [employer] at the call center. Please call me at [phone] about [name]. Stacey Torres 04/01/2015 6:58:46 PM Spoke with On Call - General Message Result Spoke with on call and provided page information Connected on call with caller.

## 2015-04-02 NOTE — Progress Notes (Signed)
Initial Nutrition Assessment  DOCUMENTATION CODES:  Non-severe (moderate) malnutrition in context of chronic illness  INTERVENTION:   (Medical Nutrition Supplement: Recommend ensure enlive BID for added nutrition, Meals and snacks: Cater to pt prefences. RN Eustaquio Maize has called kitchen for lunch tray. )  NUTRITION DIAGNOSIS:  Inadequate oral intake related to acute illness as evidenced by other (see comment) (poor po intake for past 2 weeks).    GOAL:  Patient will meet greater than or equal to 90% of their needs    MONITOR:   (Energy intake, Electrolyte and renal profile)  REASON FOR ASSESSMENT:  Malnutrition Screening Tool    ASSESSMENT:  Pt admitted with fever, dysuria, sepsis, history of ESBL infection  Past Medical History  Diagnosis Date  . Cerebral palsy, hemiplegic     pt is intelligant  . Neurologic gait dysfunction   . Spinal stenosis   . Urge urinary incontinence   . Hypertension   . Arthritis   . Neurogenic bladder   . Hydronephrosis, right   . Chronic cystitis   . Gross hematuria   . RA (rheumatoid arthritis)     advanced  . Flexion contracture joint of multiple sites     secondary to advanced ra  . Slurred speech    Pt reports poor po intake for the last 2 weeks, unable to quantify intake.  Waiting on lunch tray at this time.    Electrolyte and Renal Profile:    Recent Labs Lab 04/01/15 2020 04/02/15 0600  BUN 14 10  CREATININE 0.51 0.36*  NA 133* 139  K 4.3 3.4*    Medications: NS at 128ml/hr, vit C, senokot, calcium vit D, folic acid  Height:  Ht Readings from Last 1 Encounters:  04/02/15 5\' 3"  (1.6 m)    Weight:  Wt Readings from Last 1 Encounters:  04/02/15 113 lb 12.8 oz (51.619 kg)    Pt reports weight loss of 15 pounds in the last 2 weeks, question accuracy Per encounters wt loss of 9% in the last 2 months   Wt Readings from Last 10 Encounters:  04/02/15 113 lb 12.8 oz (51.619 kg)  01/12/14 124 lb (56.246 kg)   11/18/13 135 lb (61.236 kg)   Nutrition-Focused physical exam completed. Findings are moderate depletion of upper arm and thoracic areas fat depletion, moderate depletion in temple, clavicle, severe dorsal hand all other areas normal muscle depletion, and no  edema.    BMI:  Body mass index is 20.16 kg/(m^2).  Estimated Nutritional Needs:  Kcal:  BEE 934 kcals (IF 1.1-1.2, AF 1.2) 0488-8916 kcals/d  Protein:  (1.2-1.5 g/d)61-77 g/d  Fluid:  (25-68ml/kg) 1275-1590ml/d  Skin:  Reviewed, no issues  Diet Order:  Diet 2 gram sodium Room service appropriate?: No; Fluid consistency:: Thin  EDUCATION NEEDS:  No education needs identified at this time   Intake/Output Summary (Last 24 hours) at 04/02/15 1440 Last data filed at 04/02/15 1255  Gross per 24 hour  Intake   1292 ml  Output      0 ml  Net   1292 ml    HIGH Care Level  Stacey Torres Stacey Torres, Stacey Torres, Stacey Torres (pager)

## 2015-04-02 NOTE — Telephone Encounter (Signed)
PLEASE NOTE: All timestamps contained within this report are represented as [place] Standard Time. CONFIDENTIALTY NOTICE: This fax transmission is intended only for the addressee. It contains information that is legally privileged, confidential or otherwise protected from use or disclosure. If you are not the intended recipient, you are strictly prohibited from reviewing, disclosing, copying using or disseminating any of this information or taking any action in reliance on or regarding this information. If you have received this fax in error, please notify us immediately by telephone so that we can arrange for its return to [place]. Phone: [phone], Toll-Free: [phone], Fax: [phone] Page: 1 of 2 Call Id: [id number] Hilbert Patient Name: Stacey Torres Gender: Female DOB: [birth year] Age: 81 Y 64 M 14 D Return Phone Number: [phone] (Primary) Address: City/State/Zip: [place] [address] Client [facility] Night - Client Client Site [place] Physician Viviana Simpler Contact Type Call Call Type Triage / [place] Name Colletta Maryland Relationship To Patient Provider Return Phone Number [phone] (Primary) Chief Complaint Urination Pain Initial Comment Caller states she is Colletta Maryland from [place]. She is requesting an order for an in/out to get clean catch for UTI test. Nurse Assessment Nurse: Justine Null, RN, Rodena Piety Date/Time Eilene Ghazi Time): 04/01/2015 1:56:51 PM Confirm and document reason for call. If symptomatic, describe symptoms. 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 She is requesting an order for an in/out to get clean catch for UTI test. caller stated that she has been given an order for a urine specimen and has been no order for straight cath and has no urination pain but does have a low grade fever of  99.0 and has foul smelling urine and want to cat for the specimen Has the patient traveled out of the country within the last 30 days? ---Not Applicable Does the patient require triage? ---Declined Triage Please document clinical information provided and list any resource used. ---when triager reached the caller she stated that they had gotten and order for a urine specimen but the patient is incontinent and they would like to straight cath for the specimen Guidelines Guideline Title Affirmed Question Affirmed Notes Nurse Date/Time ([place] Time) Disp. Time Eilene Ghazi Time) Disposition Final User 04/01/2015 2:01:01 PM Paged On Call back to [facility], RNRodena Piety 04/01/2015 2:13:07 PM Call Completed Justine Null, RN, Rodena Piety 04/01/2015 2:13:21 PM Clinical Call Yes Justine Null, RN, Rodena Piety After Care Instructions Given Call Event Type User Date / Time Description Comments User: Charmayne Sheer, RN Date/Time Eilene Ghazi Time): 04/01/2015 2:15:13 PM PLEASE NOTE: All timestamps contained within this report are represented as [place] Standard Time. CONFIDENTIALTY NOTICE: This fax transmission is intended only for the addressee. It contains information that is legally privileged, confidential or otherwise protected from use or disclosure. If you are not the intended recipient, you are strictly prohibited from reviewing, disclosing, copying using or disseminating any of this information or taking any action in reliance on or regarding this information. If you have received this fax in error, please notify us immediately by telephone so that we can arrange for its return to [place]. Phone: [phone], Toll-Free: [phone], Fax: [phone] Page: 2 of 2 Call Id: [id number] Comments call placed to the caller to notify her that it was okay to do the straight cath for the urine specimen Verbalized understanding Paging [facility] Phone DateTime Result/Outcome Message Type Notes Loura Pardon [phone] 04/01/2015 2:01:01  PM Paged  On Call Back to Call Center Doctor Paged please call Rodena Piety at [facility] at [phone] Thank you Loura Pardon 04/01/2015 2:04:03 PM Spoke with On Call - General Message Result recieved a return call from the on call MD and informed her of th e request to straight cath the patient for Urine specimen and she agreed

## 2015-04-02 NOTE — Telephone Encounter (Signed)
Pt admitted to [facility] on 04/01/15 Room 224.

## 2015-04-02 NOTE — Progress Notes (Signed)
ANTIBIOTIC CONSULT NOTE - INITIAL  Pharmacy Consult for meropenem dosing  Indication: UTI/sepsis  No Known Allergies  Patient Measurements: Height: 5\' 3"  (160 cm) Weight: 113 lb 12.8 oz (51.619 kg) IBW/kg (Calculated) : 52.4 Adjusted Body Weight: n/a  Vital Signs: Temp: 99.1 F (37.3 C) (05/23 0020) Temp Source: Oral (05/23 0020) BP: 152/66 mmHg (05/23 0020) Pulse Rate: 99 (05/23 0020) Intake/Output from previous day:   Intake/Output from this shift:    Labs:  Recent Labs  04/01/15 2020  WBC 8.3  HGB 10.5*  PLT 455*  CREATININE 0.51   Estimated Creatinine Clearance: 43.4 mL/min (by C-G formula based on Cr of 0.51). No results for input(s): VANCOTROUGH, VANCOPEAK, VANCORANDOM, GENTTROUGH, GENTPEAK, GENTRANDOM, TOBRATROUGH, TOBRAPEAK, TOBRARND, AMIKACINPEAK, AMIKACINTROU, AMIKACIN in the last 72 hours.   Microbiology: No results found for this or any previous visit (from the past 720 hour(s)).  Medical History: Past Medical History  Diagnosis Date  . Cerebral palsy, hemiplegic     pt is intelligant  . Neurologic gait dysfunction   . Spinal stenosis   . Urge urinary incontinence   . Hypertension   . Arthritis   . Neurogenic bladder   . Hydronephrosis, right   . Chronic cystitis   . Gross hematuria   . RA (rheumatoid arthritis)     advanced  . Flexion contracture joint of multiple sites     secondary to advanced ra  . Slurred speech     Medications:   Assessment: Blood and urine cx pending UA: LE(+) NO2(-) WBC TNTC CXR: atelectasis   Goal of Therapy:  Resolution of infection  Plan:  Meropenem 1 gram q 12 hours ordered.  Sim Boast, PharmD, BCPS  04/02/2015

## 2015-04-02 NOTE — Progress Notes (Signed)
Paged and spoke with Dr. Margaretmary Eddy regarding diet order.  NPO currently and patient is hungry.

## 2015-04-02 NOTE — Telephone Encounter (Signed)
PLEASE NOTE: All timestamps contained within this report are represented as [place] Standard Time. CONFIDENTIALTY NOTICE: This fax transmission is intended only for the addressee. It contains information that is legally privileged, confidential or otherwise protected from use or disclosure. If you are not the intended recipient, you are strictly prohibited from reviewing, disclosing, copying using or disseminating any of this information or taking any action in reliance on or regarding this information. If you have received this fax in error, please notify us immediately by telephone so that we can arrange for its return to [place]. Phone: [phone], Toll-Free: [phone], Fax: [phone] Page: 1 of 1 Call Id: [id number] [place] Patient Name: Stacey Torres Gender: Female DOB: 23-Sep-1929 Age: 81 Y 83 M 14 D Return Phone Number: Address: City/State/Zip: [place] [address] Client [place] Night - Client Client Site  Physician Viviana Simpler Contact Type Call Call Type Page Only Caller Name Becky Relationship To Patient Care Giver Is this call to report lab results? No Return Phone Number Please choose phone number Initial Comment Caller Becky from [place] home. CB# [phone]. Patient has urinary sx with chronic discharge. Feels weak today. Nurse Assessment Guidelines Guideline Title Affirmed Question Affirmed Notes Nurse Date/Time ([place] Time) Disp. Time Eilene Ghazi Time) Disposition Final User 04/01/2015 10:08:04 AM Send to [facility] Paging Queue Toppins, Dava 04/01/2015 10:12:03 AM Paged On Call to Other Provider Dalia Heading 04/01/2015 10:12:20 AM Page Completed Yes Dalia Heading After Care Instructions Given Call Event Type User Date / Time Description Paging [facility] Phone DateTime Result/Outcome  Message Type Notes Loura Pardon [phone] 04/01/2015 10:12:03 AM Paged On Call to Other Provider Doctor Paged [facility]: Please call Becky at [phone] regarding Thomas Hoff. Loura Pardon 04/01/2015 10:12:10 AM Paged On Call to Another Provider Message Result

## 2015-04-03 ENCOUNTER — Encounter: Payer: Self-pay | Admitting: Radiology

## 2015-04-03 ENCOUNTER — Inpatient Hospital Stay: Payer: Medicare Other

## 2015-04-03 LAB — CBC
HCT: 26.8 % — ABNORMAL LOW (ref 35.0–47.0)
Hemoglobin: 8.7 g/dL — ABNORMAL LOW (ref 12.0–16.0)
MCH: 32.5 pg (ref 26.0–34.0)
MCHC: 32.6 g/dL (ref 32.0–36.0)
MCV: 99.7 fL (ref 80.0–100.0)
PLATELETS: 328 10*3/uL (ref 150–440)
RBC: 2.69 MIL/uL — AB (ref 3.80–5.20)
RDW: 14.6 % — ABNORMAL HIGH (ref 11.5–14.5)
WBC: 6.4 10*3/uL (ref 3.6–11.0)

## 2015-04-03 LAB — URINE CULTURE

## 2015-04-03 LAB — BASIC METABOLIC PANEL
Anion gap: 5 (ref 5–15)
BUN: 9 mg/dL (ref 6–20)
CALCIUM: 8.1 mg/dL — AB (ref 8.9–10.3)
CO2: 27 mmol/L (ref 22–32)
CREATININE: 0.39 mg/dL — AB (ref 0.44–1.00)
Chloride: 105 mmol/L (ref 101–111)
GFR calc non Af Amer: >60 mL/min (ref >60)
Glucose, Bld: 84 mg/dL (ref 65–99)
POTASSIUM: 3.5 mmol/L (ref 3.5–5.1)
Sodium: 137 mmol/L (ref 135–145)

## 2015-04-03 MED ORDER — IOHEXOL 300 MG/ML  SOLN
75.0000 mL | Freq: Once | INTRAMUSCULAR | Status: AC | PRN
  Administered 2015-04-03: 75 mL via INTRAVENOUS

## 2015-04-03 MED ORDER — IOHEXOL 240 MG/ML SOLN
50.0000 mL | INTRAMUSCULAR | Status: AC
  Administered 2015-04-03 (×2): 50 mL via ORAL

## 2015-04-03 NOTE — Progress Notes (Addendum)
Physical Therapy Evaluation Patient Details Name: Stacey Torres MRN: [id number] DOB: 02-10-1929 Today's Date: 04/03/2015   History of Present Illness  Pt is a pleasant 81 year old female who was admitted for sepsis. Pt with history of RA, HTN, CP and has multiple flexion contractures. Pt with complaints of dysuria and fever. Pt also with acute T 12 compression fracture. Pt from [facility]  Clinical Impression  Pt is a pleasant 81 year old female who was admitted for sepsis. Pt at baseline at this time with mobility as she is total assist, using lift for transfer in/out of electric wheelchair. Therapist assisted pt in rolling in bed for donning gown and pt is total assist +2. RN updated on plan of care and no PT needs necessary at this time. Would encourage RN/NA to use lift to transfer pt daily to recliner. RN/NA notified of patient needs. Will dc current orders at this time. Pt requires OT consult at this time and niece bringing in equipment next date for OT. OT notified.     Follow Up Recommendations  (return back to [facility])    Equipment Recommendations       Recommendations for Other Services OT consult (RN notified)     Precautions / Restrictions Precautions Precautions: None Restrictions Weight Bearing Restrictions: No      Mobility  Bed Mobility               General bed mobility comments: not performed as pt bed bound at baseline, uses hoyer lift for all transfers.  Transfers                    Ambulation/Gait             General Gait Details: not performed as pt total assist and only transfers to electric wheelchair with hoyer lift at [place]    Modified Rankin (Stroke Patients Only)       Balance                                             Pertinent Vitals/Pain Pain Assessment: No/denies pain    Home Living Family/patient expects to be discharged to::   (LTC)                      Prior Function Level of Independence: Needs assistance   Gait / Transfers Assistance Needed: total assist at this time using hoyer lift           Hand Dominance        Extremity/Trunk Assessment   Upper Extremity Assessment: Generalized weakness (difficulty raising B UE to grossly 30 degrees. At baseline.)           Lower Extremity Assessment: Generalized weakness (difficult to assess secondary to contractures. At baseline)         Communication   Communication: No difficulties  Cognition Arousal/Alertness: Awake/alert Behavior During Therapy: WFL for tasks assessed/performed Overall Cognitive Status: Within Functional Limits for tasks assessed                      General Comments      Exercises        Assessment/Plan    PT Assessment Patent does not need any further  PT services  PT Diagnosis     PT Problem List    PT Treatment Interventions     PT Goals (Current goals can be found in the Care Plan section) Acute Rehab PT Goals Patient Stated Goal: to return back to [facility] PT Goal Formulation: With patient Time For Goal Achievement: 04/03/15 Potential to Achieve Goals: Good    Frequency     Barriers to discharge        Co-evaluation               End of Session   Activity Tolerance: Patient tolerated treatment well Patient left: in bed;with bed alarm set Nurse Communication: Mobility status         Time: 1610-9604 PT Time Calculation (min) (ACUTE ONLY): 20 min   Charges:   PT Evaluation $Initial PT Evaluation Tier I: 1 Procedure     PT G CodesGreggory Torres, PT, DPT [phone]   Stacey Torres 04/03/2015, 3:59 PM

## 2015-04-03 NOTE — Progress Notes (Signed)
[place] [facility] [place] NAME: Stacey Torres    MR#:  [id number]  DATE OF BIRTH:  1929-08-15  SUBJECTIVE:  CHIEF COMPLAINT:  Fever and dysuria Resting comfortably, reporting exertional dyspnea . Denies any vaginal bleeding today. Denies any nausea or vomiting  REVIEW OF SYSTEMS:  CONSTITUTIONAL: No fever, fatigue or weakness.  EYES: No blurred or double vision.  EARS, NOSE, AND THROAT: No tinnitus or ear pain.  RESPIRATORY: No cough, shortness of breath, wheezing or hemoptysis.  CARDIOVASCULAR: No chest pain, orthopnea, edema.  GASTROINTESTINAL: No nausea, vomiting, diarrhea or abdominal pain.  GENITOURINARY: No dysuria, hematuria.  ENDOCRINE: No polyuria, nocturia,  HEMATOLOGY: No anemia, easy bruising or bleeding SKIN: No rash or lesion. MUSCULOSKELETAL: No joint pain or arthritis.   NEUROLOGIC: No tingling, numbness, weakness.  PSYCHIATRY: No anxiety or depression.   DRUG ALLERGIES:  No Known Allergies  VITALS:  Blood pressure 119/91, pulse 83, temperature 98.3 F (36.8 C), temperature source Oral, resp. rate 16, height 5\' 3"  (1.6 m), weight 51.619 kg (113 lb 12.8 oz), SpO2 100 %.  PHYSICAL EXAMINATION:  GENERAL:  81 y.o.-year-old patient lying in the bed with no acute distress.  EYES: Pupils equal, round, reactive to light and accommodation. No scleral icterus. Extraocular muscles intact.  HEENT: Head atraumatic, normocephalic. Oropharynx and nasopharynx clear.  NECK:  Supple, no jugular venous distention. No thyroid enlargement, no tenderness.  LUNGS: Normal breath sounds bilaterally, no wheezing, rales,rhonchi or crepitation. No use of accessory muscles of respiration.  CARDIOVASCULAR: S1, S2 normal. No murmurs, rubs, or gallops.  ABDOMEN: Soft, nontender, nondistended. Bowel sounds present. No organomegaly or mass.  EXTREMITIES: No pedal edema, cyanosis, or clubbing.  NEUROLOGIC: Cranial nerves II through XII are  intact. Muscle strength 5/5 in all extremities. Sensation intact. Gait not checked.  PSYCHIATRIC: The patient is alert and oriented x 3.  SKIN: No obvious rash, lesion, or ulcer.    LABORATORY PANEL:   CBC  Recent Labs Lab 04/03/15 0637  WBC 6.4  HGB 8.7*  HCT 26.8*  PLT 328   ------------------------------------------------------------------------------------------------------------------  Chemistries   Recent Labs Lab 04/01/15 2020  04/03/15 0637  NA 133*  < > 137  K 4.3  < > 3.5  CL 93*  < > 105  CO2 30  < > 27  GLUCOSE 111*  < > 84  BUN 14  < > 9  CREATININE 0.51  < > 0.39*  CALCIUM 9.2  < > 8.1*  AST 17  --   --   ALT 7*  --   --   ALKPHOS 65  --   --   BILITOT 0.2*  --   --   < > = values in this interval not displayed. ------------------------------------------------------------------------------------------------------------------  Cardiac Enzymes No results for input(s): TROPONINI in the last 168 hours. ------------------------------------------------------------------------------------------------------------------  RADIOLOGY:  Dg Chest Port 1 View  04/01/2015   CLINICAL DATA:  Nausea and vomiting, fever beginning yesterday. History of spinal stenosis, cerebral palsy.  EXAM: PORTABLE CHEST - 1 VIEW  COMPARISON:  None available for comparison at time of study interpretation.  FINDINGS: Elevated LEFT hemidiaphragm. LEFT lung base strandy densities. No pleural effusion or focal consolidation. Cardiac silhouette appears normal in size. Calcified aorta. Patient is rotated local RIGHT. No pneumothorax. Severe degenerative change of the RIGHT shoulder. T12 severe compression fracture, incompletely imaged.  IMPRESSION: Elevated LEFT hemidiaphragm with atelectasis.  Severe T12 compression fracture incompletely imaged.   Electronically Signed  By: Elon Alas   On: 04/01/2015 21:10    EKG:   Orders placed or performed during the hospital encounter of 04/01/15   . EKG 12-Lead  . EKG 12-Lead    ASSESSMENT AND PLAN:    82 year old Caucasian female history of rheumatoid arthritis, essential hypertension presenting one-day duration fever.  1. Sepsis, meeting septic criteria by temperature, respiratory rate, heart rate present on arrival. Secondary to acute cystitis . Panculture.  Broad-spectrum antibiotics including vancomycin/Zosyn given in emergency department however will cover with meropenem given history of ESBL Escherichia coli. and taper antibiotics when culture data returns. Continue IV fluid hydration to keep mean arterial pressure greater than 65.   2. Essential hypertension, metoprolol  3. Rheumatoid arthritis: Plaquenil  4. Hypokalemia-replace and check the BMP in a.m.  5.Venous thromboembolism prophylactic heparin subcutaneous  6.Generalized weakness with history of cerebral palsy and hemiplegia-PT consult placed for evaluation and treatment as tolerated only  7. Vaginal bleeding /infection - appreciate Ob/gyn consult, CT abdomen and pelvis is ordered as recommended by OB. Consult is placed to GYN oncology as that is a concern for cancer    All the records are reviewed and case discussed with Care Management/Social Workerr. Management plans discussed with the patient, family and they are in agreement.  CODE STATUS: DO NOT RESUSCITATE  TOTAL TIME TAKING CARE OF THIS PATIENT: 35 minutes.   POSSIBLE D/C IN 2-3 DAYS, DEPENDING ON CLINICAL CONDITION.   Nicholes Mango M.D on 04/03/2015 at 5:48 PM  Between 7am to 6pm - Pager - [phone] After 6pm go to [email or web address] - password [email or web address] [facility]  Office  [phone]  CC: Primary care physician; Viviana Simpler, MD

## 2015-04-03 NOTE — Consult Note (Signed)
Obstetrics & Gynecology Consultation Note  Date of Consultation: 04/03/2015   Requesting Provider: [facility] ER;  [place]  Primary OBGYN: Jola Babinski Primary Care Provider: Viviana Simpler  Reason for Consultation: This is a 81 year old female G0 P0000, whose last normal menstrual period wasyears ago. She complains of vaginal bleeding. The patient states that the bleeding has been going on for several years. She is currently at [facility] for about 3 years. Prior to being at [facility], she was in [place] she had the bleeding. The source of the bleeding is unclear. She has seen a Dealer here, also, in [place]. She has had a urologic evaluation and has been told that she does not have a urologic issue. She has not been assessed by a gastroenterologist so far. She is unclear whether she has hemorrhoids. She states that she had a colonoscopy about 6-7 years ago (overall unclear) in [place] and was told that it was normal. He has had the bleeding every couple of months (unclear).   She has had a new and unusual discharge that is yellow and mucousy. She denies vaginal itching and burning. She has had an association between a bladder infection and her bleeding. When she receives antibiotics, the bleeding stops. The last couple of times she received no antibiotics and the bleeding stopped by itself. She occasionally notes clots and the blood is bright red. She never has symptoms when she has a UTI. She is incontinent of urine and not of stool. They are unsure of the method of collection of the urine specimen. She notes that she has lost about 16 pounds over the past 3-4 months. She states she has no appetite. She denies bloated. She denies early satiety. She has a little constipation. She denies night sweats.  PAP- benign endometrial cells.  For this reason further uterine eval was recommended. [place]- abd only due to age, rheumatoid.  ES was 110mm which is elevated for her age.  Rec further eval.  EMB difficult so  CT and Gyn-Onc referral was next step in process prior to this admission.  ROS: A 12-point review of systems was performed and negative, except as stated in the above HPI.  OBGYN History: As per HPI. OB History    No data available     No. history of STIs.   No. HRT use.    Past Medical History: Past Medical History  Diagnosis Date  . Cerebral palsy, hemiplegic     pt is intelligant  . Neurologic gait dysfunction   . Spinal stenosis   . Urge urinary incontinence   . Hypertension   . Arthritis   . Neurogenic bladder   . Hydronephrosis, right   . Chronic cystitis   . Gross hematuria   . RA (rheumatoid arthritis)     advanced  . Flexion contracture joint of multiple sites     secondary to advanced ra  . Slurred speech     Past Surgical History: Past Surgical History  Procedure Laterality Date  . Total knee arthroplasty Right 2005  . Cholecystectomy  1995  . Cystoscopy with retrograde pyelogram, ureteroscopy and stent placement Bilateral 11/21/2013    Procedure: CYSTOSCOPY WITH RIGHT RETROGRADE PYELOGRAM, RIGHT FLEXIBLE and rigid URETEROSCOPY  PLACEMENT;  Surgeon: Bernestine Amass, MD;  Location: [facility];  Service: Urology;  Laterality: Bilateral;  . Cystogram  11/21/2013    Procedure: CYSTOGRAM;  Surgeon: Bernestine Amass, MD;  Location: [facility];  Service: Urology;;  Family History:  Family History  Problem Relation Age of Onset  . CAD Other    She denies any female cancers, bleeding or blood clotting disorders.   Social History:  History   Social History  . Marital Status: Single    Spouse Name: N/A  . Number of Children: N/A  . Years of Education: N/A   Occupational History  . Not on file.   Social History Main Topics  . Smoking status: Former Smoker    Quit date: 11/18/1958  . Smokeless tobacco: Never Used  . Alcohol Use: No  . Drug Use: No  . Sexual Activity: Not on file   Other Topics Concern  . Not on file    Social History Narrative   Allergy: No Known Allergies  Current Outpatient Medications: Prescriptions prior to admission  Medication Sig Dispense Refill Last Dose  . acetaminophen (TYLENOL) 650 MG CR tablet Take 650 mg by mouth 2 (two) times daily as needed for pain.   04/01/2015 at Unknown time  . acetaminophen (TYLENOL) 650 MG CR tablet Take 650 mg by mouth every 8 (eight) hours as needed for pain.     Marland Kitchen ALPRAZolam (XANAX) 0.25 MG tablet Take 0.25 mg by mouth 2 (two) times daily as needed.    04/01/2015 at Unknown time  . ALPRAZolam (XANAX) 0.25 MG tablet Take 0.25 mg by mouth 3 (three) times daily.     Marland Kitchen ALPRAZolam (XANAX) 0.25 MG tablet Take 0.25 mg by mouth 2 (two) times daily.     . Ascorbic Acid (VITAMIN C) 1000 MG tablet Take 1,000 mg by mouth daily.     . benzonatate (TESSALON) 100 MG capsule Take 200 mg by mouth 3 (three) times daily as needed for cough.     . Calcium Carbonate-Vitamin D (CALCIUM 600+D) 600-400 MG-UNIT per tablet Take 1 tablet by mouth every evening.   04/01/2015 at Unknown time  . desonide (DESOWEN) 0.05 % cream Apply topically.     . diphenhydrAMINE (SOMINEX) 25 MG tablet Take 25 mg by mouth 2 (two) times daily as needed for sleep.     . DULoxetine (CYMBALTA) 60 MG capsule Take 60 mg by mouth every morning.   Taking  . Emollient (CERAVE) LOTN Apply topically.     . folic acid (FOLVITE) 1 MG tablet Take 1 mg by mouth daily.     . hydroxychloroquine (PLAQUENIL) 200 MG tablet Take 200 mg by mouth 2 (two) times daily.   Taking  . hydroxypropyl methylcellulose / hypromellose (ISOPTO TEARS / GONIOVISC) 2.5 % ophthalmic solution Place 2 drops into both eyes 2 (two) times daily as needed for dry eyes.     Marland Kitchen ipratropium (ATROVENT) 0.03 % nasal spray Place 2 sprays into both nostrils 2 (two) times daily.   Taking  . ketoconazole (NIZORAL) 2 % cream Apply 1 application topically 2 (two) times daily.     Marland Kitchen loratadine (CLARITIN) 10 MG tablet Take 10 mg by mouth every  morning.    Taking  . magnesium hydroxide (MILK OF MAGNESIA) 400 MG/5ML suspension Take 30 mLs by mouth 2 (two) times daily as needed for mild constipation.    Taking  . methotrexate (RHEUMATREX) 2.5 MG tablet Take 2.5 mg by mouth once a week. Caution:Chemotherapy. Protect from light.     . metoprolol succinate (TOPROL-XL) 50 MG 24 hr tablet Take 50 mg by mouth 2 (two) times daily. Take with or immediately following a meal.   Taking  . ondansetron (ZOFRAN) 4 MG  tablet Take 4 mg by mouth every 4 (four) hours as needed for nausea or vomiting.    Taking  . polyethylene glycol (MIRALAX / GLYCOLAX) packet Take 17 g by mouth at bedtime.     . primidone (MYSOLINE) 50 MG tablet Take 50 mg by mouth 2 (two) times daily.    Taking  . senna-docusate (SENOKOT-S) 8.6-50 MG per tablet Take 2 tablets by mouth 2 (two) times daily as needed for mild constipation.     . sennosides-docusate sodium (SENOKOT-S) 8.6-50 MG tablet Take 1 tablet by mouth at bedtime.   Taking  . therapeutic multivitamin-minerals (THERAGRAN-M) tablet Take 1 tablet by mouth every morning.   Taking  . traMADol (ULTRAM) 50 MG tablet Take by mouth every 8 (eight) hours as needed.    Taking  . traZODone (DESYREL) 50 MG tablet Take 50 mg by mouth at bedtime.   Taking  . zinc oxide 20 % ointment Apply 1 application topically as needed for irritation.     Marland Kitchen dextromethorphan 15 MG/5ML syrup Take 10 mLs by mouth 4 (four) times daily as needed for cough.   Taking  . gabapentin (NEURONTIN) 100 MG capsule Take 100 mg by mouth 2 (two) times daily.   Taking     Hospital Medications: Current Facility-Administered Medications  Medication Dose Route Frequency Provider Last Rate Last Dose  . 0.9 %  sodium chloride infusion   Intravenous Continuous Lytle Butte, MD 100 mL/hr at 04/03/15 2993    . acetaminophen (TYLENOL) tablet 650 mg  650 mg Oral Q6H PRN Lytle Butte, MD   650 mg at 04/02/15 2234   Or  . acetaminophen (TYLENOL) suppository 650 mg  650 mg  Rectal Q6H PRN Lytle Butte, MD      . ALPRAZolam Duanne Moron) tablet 0.25 mg  0.25 mg Oral TID Lytle Butte, MD   0.25 mg at 04/03/15 7169  . calcium-vitamin D (OSCAL WITH D) 500-200 MG-UNIT per tablet 1 tablet  1 tablet Oral QPM Lytle Butte, MD   1 tablet at 04/02/15 1705  . diphenhydrAMINE (SOMINEX) tablet 25 mg  25 mg Oral BID PRN Lytle Butte, MD      . DULoxetine (CYMBALTA) DR capsule 60 mg  60 mg Oral q morning - 10a Lytle Butte, MD   60 mg at 04/03/15 6789  . feeding supplement (ENSURE ENLIVE) (ENSURE ENLIVE) liquid 237 mL  237 mL Oral BID BM Aruna Gouru, MD   237 mL at 04/03/15 1000  . folic acid (FOLVITE) tablet 1 mg  1 mg Oral Daily Lytle Butte, MD   1 mg at 04/03/15 3810  . gabapentin (NEURONTIN) capsule 100 mg  100 mg Oral BID Lytle Butte, MD   100 mg at 04/03/15 1751  . heparin injection 5,000 Units  5,000 Units Subcutaneous 3 times per day Lytle Butte, MD   5,000 Units at 04/03/15 0258  . hydroxychloroquine (PLAQUENIL) tablet 200 mg  200 mg Oral BID Lytle Butte, MD   200 mg at 04/03/15 5277  . hydroxypropyl methylcellulose / hypromellose (ISOPTO TEARS / GONIOVISC) 2.5 % ophthalmic solution 2 drop  2 drop Both Eyes BID PRN Lytle Butte, MD      . ipratropium (ATROVENT) 0.03 % nasal spray 2 spray  2 spray Each Nare BID Lytle Butte, MD   2 spray at 04/03/15 1000  . loratadine (CLARITIN) tablet 10 mg  10 mg Oral q morning - 10a Shanon Brow  Woodfin Ganja, MD   10 mg at 04/03/15 0921  . magnesium hydroxide (MILK OF MAGNESIA) suspension 30 mL  30 mL Oral BID PRN Lytle Butte, MD      . meropenem [facility]) 1 g in sodium chloride 0.9 % 100 mL IVPB  1 g Intravenous Q12H Lytle Butte, MD   1 g at 04/02/15 2237  . metoprolol succinate (TOPROL-XL) 24 hr tablet 50 mg  50 mg Oral BID Lytle Butte, MD   50 mg at 04/03/15 9983  . morphine 2 MG/ML injection 2 mg  2 mg Intravenous Q4H PRN Lytle Butte, MD      . ondansetron [facility]) tablet 4 mg  4 mg Oral Q6H PRN Lytle Butte, MD       Or  .  ondansetron [facility]) injection 4 mg  4 mg Intravenous Q6H PRN Lytle Butte, MD      . polyethylene glycol (MIRALAX / GLYCOLAX) packet 17 g  17 g Oral QHS Lytle Butte, MD   17 g at 04/02/15 0113  . senna-docusate (Senokot-S) tablet 2 tablet  2 tablet Oral BID PRN Lytle Butte, MD      . traZODone (DESYREL) tablet 50 mg  50 mg Oral QHS Lytle Butte, MD   50 mg at 04/02/15 2236  . vitamin C (ASCORBIC ACID) tablet 1,000 mg  1,000 mg Oral Daily Lytle Butte, MD   1,000 mg at 04/03/15 3825     Physical Exam: Filed Vitals:   04/02/15 2235 04/02/15 2329 04/03/15 0005 04/03/15 0813  BP: 128/64 107/45 125/51 133/57  Pulse:  85  82  Temp:  98.4 F (36.9 C)  97.6 F (36.4 C)  TempSrc:  Oral  Oral  Resp:  17  17  Height:      Weight:      SpO2:  99%  97%    Temp:  [97.5 F (36.4 C)-98.4 F (36.9 C)] 97.6 F (36.4 C) (05/24 0813) Pulse Rate:  [82-92] 82 (05/24 0813) Resp:  [16-17] 17 (05/24 0813) BP: (107-133)/(45-64) 133/57 mmHg (05/24 0813) SpO2:  [95 %-99 %] 97 % (05/24 0813) I/O last 3 completed shifts: In: 2791 [P.O.:60; I.V.:2731] Out: 0  Total I/O In: 624.2 [P.O.:240; I.V.:384.2] Out: 0   Intake/Output Summary (Last 24 hours) at 04/03/15 1211 Last data filed at 04/03/15 0813  Gross per 24 hour  Intake 2674.15 ml  Output      0 ml  Net 2674.15 ml     Current Vital Signs 24h Vital Sign Ranges  T 97.6 F (36.4 C) Temp  Avg: 97.8 F (36.6 C)  Min: 97.5 F (36.4 C)  Max: 98.4 F (36.9 C)  BP (!) 133/57 mmHg BP  Min: 107/45  Max: 133/57  HR 82 Pulse  Avg: 87.5  Min: 82  Max: 92  RR 17 Resp  Avg: 16.7  Min: 16  Max: 17  SaO2 97 % Not Delivered SpO2  Avg: 97 %  Min: 95 %  Max: 99 %       24 Hour I/O Current Shift I/O  Time Ins Outs 05/23 0701 - 05/24 0700 In: 2791 [P.O.:60; I.V.:2731] Out: 0  05/24 0701 - 05/24 1900 In: 624.2 [P.O.:240; I.V.:384.2] Out: 0    Patient Vitals for the past 8 hrs:  BP Temp Temp src Pulse Resp SpO2  04/03/15 0813 (!) 133/57 mmHg  97.6 F (36.4 C) Oral 82 17 97 %    Body mass  index is 20.16 kg/(m^2). General appearance: Well nourished, well developed female in no acute distress.  Neck:  Supple, normal appearance, and no thyromegaly  Abdomen: positive bowel sounds and no masses, hernias; diffusely non tender to palpation, non distended  Neuro/Psych:  Normal mood and affect.  Skin:  Warm and dry.  Pelvic exam: deferred; last done in office  Laboratory:  Recent Labs Lab 04/01/15 2020 04/02/15 0600 04/03/15 0637  WBC 8.3 6.2 6.4  HGB 10.5* 8.5* 8.7*  HCT 30.9* 25.9* 26.8*  PLT 455* 331 328    Recent Labs Lab 04/01/15 2020 04/02/15 0600 04/03/15 0637  NA 133* 139 137  K 4.3 3.4* 3.5  CL 93* 106 105  CO2 30 28 27   BUN 14 10 9   CREATININE 0.51 0.36* 0.39*  CALCIUM 9.2 7.9* 8.1*  PROT 7.1  --   --   BILITOT 0.2*  --   --   ALKPHOS 65  --   --   ALT 7*  --   --   AST 17  --   --   GLUCOSE 111* 87 84   No results for input(s): APTT, INR, PTT in the last 168 hours.  Invalid input(s): DRHAPTT No results for input(s): ABORH in the last 168 hours.  Imaging:  See [place] 03/16/15  Assessment: Ms. Dorough is a 81 y.o. . (No LMP recorded. Patient is postmenopausal.) who presented to the ED with complaints of vag bleeding; findings are consistent with endometrial abnormality.  Evaluation for cancer indicated.  Could be atrophy related, urologic related, or medication induced.  Plan: Pt has a scheduled appt with Gyn Onc for Wednesday, Drs Theora Gianotti or [place]. Should try to keep this appt either in-patient or at the cancer center. Pt requires CT of abd and pelvis w contrast as unable to do adequate pelvic US nor EMB. Bleeding not present today, not a worry in and of itself, but worrisome for cancer diagnosis and thus future management options.  This will be discussed with Gyn Onc.   Barnett Applebaum, MD [facility] OBGYN Pager [phone]

## 2015-04-04 ENCOUNTER — Telehealth: Payer: Self-pay | Admitting: *Deleted

## 2015-04-04 DIAGNOSIS — Z87891 Personal history of nicotine dependence: Secondary | ICD-10-CM

## 2015-04-04 DIAGNOSIS — Z79899 Other long term (current) drug therapy: Secondary | ICD-10-CM

## 2015-04-04 DIAGNOSIS — M1990 Unspecified osteoarthritis, unspecified site: Secondary | ICD-10-CM

## 2015-04-04 DIAGNOSIS — G809 Cerebral palsy, unspecified: Secondary | ICD-10-CM

## 2015-04-04 DIAGNOSIS — R9389 Abnormal findings on diagnostic imaging of other specified body structures: Secondary | ICD-10-CM | POA: Insufficient documentation

## 2015-04-04 DIAGNOSIS — I10 Essential (primary) hypertension: Secondary | ICD-10-CM

## 2015-04-04 DIAGNOSIS — N319 Neuromuscular dysfunction of bladder, unspecified: Secondary | ICD-10-CM

## 2015-04-04 DIAGNOSIS — N939 Abnormal uterine and vaginal bleeding, unspecified: Secondary | ICD-10-CM

## 2015-04-04 DIAGNOSIS — R938 Abnormal findings on diagnostic imaging of other specified body structures: Secondary | ICD-10-CM

## 2015-04-04 MED ORDER — SENNOSIDES-DOCUSATE SODIUM 8.6-50 MG PO TABS
1.0000 | ORAL_TABLET | Freq: Two times a day (BID) | ORAL | Status: DC
  Administered 2015-04-04 – 2015-04-05 (×3): 1 via ORAL
  Filled 2015-04-04 (×3): qty 1

## 2015-04-04 MED ORDER — OXYCODONE-ACETAMINOPHEN 5-325 MG PO TABS
1.0000 | ORAL_TABLET | Freq: Four times a day (QID) | ORAL | Status: DC | PRN
  Administered 2015-04-04 (×2): 1 via ORAL
  Filled 2015-04-04 (×2): qty 1

## 2015-04-04 MED ORDER — AMOXICILLIN-POT CLAVULANATE 875-125 MG PO TABS
1.0000 | ORAL_TABLET | Freq: Two times a day (BID) | ORAL | Status: DC
  Administered 2015-04-04 – 2015-04-05 (×3): 1 via ORAL
  Filled 2015-04-04 (×3): qty 1

## 2015-04-04 NOTE — Telephone Encounter (Signed)
Hand off of care performed from cancer center to nurse on 2c. Dr. Theora Gianotti peformed pelvic exam without biopsy. Pt stable. Ready for transport. Orderly contacted to transport client back to unit-room 224.

## 2015-04-04 NOTE — Consult Note (Addendum)
Gynecologic Oncology Consult Visit   Referring Provider: Prentice Docker, MD  Chief Concern: endometrial thickness on radiology imaging and history of vaginal bleeding.   Subjective:  Stacey Torres is a 81 y.o. female who is seen in consultation from Dr. Glennon Mac endometrial thickness on radiology imaging and history of vaginal bleeding. She was previously seen by Drs. Earney Navy and OB/GYN who recommended consultation with gynecologic oncology.   The patient is a resident at [facility] for about 3 years. She has recently been admitted to [facility]. She states she is no longer bleeding at this time. It is very difficult to determine her history and Dr. Kenton Kingfisher' and Dr. Marisue Brooklyn notes were very helpful. A PAP that revealed benign endometrial cells.Pelvic ultrasound revealed ES was 8mm which is elevated for her age.Due to the patient's cerebral palsy, pelvic examination and EMBx are very difficult.    03/13/2015 Pelvic Ultrasound  FINDINGS: Uterus  Measurements: 4.6 x 2.5 x 3.3 cm. No fibroids or other mass visualized.  Endometrium  Thickness: 7 mm. The endometrium appears mildly expanded however is poorly visualized on transabdominal images.  Right ovary  Measurements: 2.7 x 1.6 x 2.0 cm. There is a 2.1 x 1.6 x 2.1 cm hypoechoic lesion within the right ovary.  Left ovary  Not visualized.  Other findings: No free fluid in the pelvis. There is a large amount of mixed echogenicity material demonstrated within the bladder lumen measuring 5.6 x 3.3 x 5.5 cm.  IMPRESSION: Limited exam as only transabdominal images were able to performed, markedly limiting evaluation of the endometrium.  The endometrium appears thickened measuring up to 7 mm and mildly expanded with possible associated fluid. Endometrial mass not excluded. In the setting of post-menopausal bleeding, endometrial sampling is indicated to exclude carcinoma. If results are benign, sonohysterogram  should be considered for focal lesion work-up. (Ref: Radiological Reasoning: Algorithmic Workup of Abnormal Vaginal Bleeding with Endovaginal Sonography and Sonohysterography. AJR 2008; 962:X52-84)  Large amount of mixed echogenicity material within the bladder lumen. As the patient was not able to rotate, it is unclear if this is mobile. Findings may represent bladder debris however underlying intraluminal bladder mass is not excluded. Recommend correlation with direct visualization.  There is a 2.1 cm cyst within the right ovary. Recommend follow-up ultrasound in 12 months.     04/03/2015 CT A/P  Reproductive: Uterus and ovaries appear normal.  Other: The bladder is distended with concentric wall thickening at upper limits of normal measuring 3 mm image 70. No free fluid or free air.  IMPRESSION: Progression of previously seen right greater than left hydroureteronephrosis with mild concentric bladder wall thickening. Hypo enhancement of the right kidney is compatible with obstructive uropathy. This could be due to chronic cystitis, infiltrative bladder malignancy, non radiopaque stones, or strictures.  Slight progression of intrahepatic ductal dilatation and common duct dilatation status post cholecystectomy, with tapering to the ampulla.   Problem List: Patient Active Problem List   Diagnosis Date Noted  . Malnutrition of moderate degree 04/02/2015  . Sepsis due to urinary tract infection 04/01/2015  . Rheumatoid arthritis 04/01/2015  . Essential hypertension 04/01/2015  . Hydronephrosis of right kidney 11/21/2013    Past Medical History: Past Medical History  Diagnosis Date  . Cerebral palsy, hemiplegic     pt is intelligant  . Neurologic gait dysfunction   . Spinal stenosis   . Urge urinary incontinence   . Hypertension   . Arthritis   . Neurogenic bladder   . Hydronephrosis,  right   . Chronic cystitis   . Gross hematuria   . RA (rheumatoid  arthritis)     advanced  . Flexion contracture joint of multiple sites     secondary to advanced ra  . Slurred speech   . Cancer skin ca    Past Surgical History: Past Surgical History  Procedure Laterality Date  . Total knee arthroplasty Right 2005  . Cholecystectomy  1995  . Cystoscopy with retrograde pyelogram, ureteroscopy and stent placement Bilateral 11/21/2013    Procedure: CYSTOSCOPY WITH RIGHT RETROGRADE PYELOGRAM, RIGHT FLEXIBLE and rigid URETEROSCOPY  PLACEMENT;  Surgeon: Bernestine Amass, MD;  Location: [facility];  Service: Urology;  Laterality: Bilateral;  . Cystogram  11/21/2013    Procedure: CYSTOGRAM;  Surgeon: Bernestine Amass, MD;  Location: [facility];  Service: Urology;;    Past Gynecologic History:  As per HPI  OB History:  OB History  No data available    Family History: Family History  Problem Relation Age of Onset  . CAD Other     Social History: History   Social History  . Marital Status: Single    Spouse Name: N/A  . Number of Children: N/A  . Years of Education: N/A   Occupational History  . Not on file.   Social History Main Topics  . Smoking status: Former Smoker    Quit date: 11/18/1958  . Smokeless tobacco: Never Used  . Alcohol Use: No  . Drug Use: No  . Sexual Activity: Not on file   Other Topics Concern  . Not on file   Social History Narrative    Allergies: No Known Allergies  Current Medications: Current Facility-Administered Medications  Medication Dose Route Frequency Provider Last Rate Last Dose  . 0.9 %  sodium chloride infusion   Intravenous Continuous Lytle Butte, MD 100 mL/hr at 04/04/15 0446    . acetaminophen (TYLENOL) tablet 650 mg  650 mg Oral Q6H PRN Lytle Butte, MD   650 mg at 04/02/15 2234   Or  . acetaminophen (TYLENOL) suppository 650 mg  650 mg Rectal Q6H PRN Lytle Butte, MD      . ALPRAZolam Duanne Moron) tablet 0.25 mg  0.25 mg Oral TID Lytle Butte, MD   0.25 mg at  04/04/15 1037  . calcium-vitamin D (OSCAL WITH D) 500-200 MG-UNIT per tablet 1 tablet  1 tablet Oral QPM Lytle Butte, MD   1 tablet at 04/03/15 1753  . diphenhydrAMINE (SOMINEX) tablet 25 mg  25 mg Oral BID PRN Lytle Butte, MD      . DULoxetine (CYMBALTA) DR capsule 60 mg  60 mg Oral q morning - 10a Lytle Butte, MD   60 mg at 04/04/15 1038  . feeding supplement (ENSURE ENLIVE) (ENSURE ENLIVE) liquid 237 mL  237 mL Oral BID BM Aruna Gouru, MD   237 mL at 04/04/15 1000  . folic acid (FOLVITE) tablet 1 mg  1 mg Oral Daily Lytle Butte, MD   1 mg at 04/04/15 1038  . gabapentin (NEURONTIN) capsule 100 mg  100 mg Oral BID Lytle Butte, MD   100 mg at 04/04/15 1038  . heparin injection 5,000 Units  5,000 Units Subcutaneous 3 times per day Lytle Butte, MD   5,000 Units at 04/04/15 0093  . hydroxychloroquine (PLAQUENIL) tablet 200 mg  200 mg Oral BID Lytle Butte, MD   200 mg at 04/04/15 1038  .  hydroxypropyl methylcellulose / hypromellose (ISOPTO TEARS / GONIOVISC) 2.5 % ophthalmic solution 2 drop  2 drop Both Eyes BID PRN Lytle Butte, MD      . ipratropium (ATROVENT) 0.03 % nasal spray 2 spray  2 spray Each Nare BID Lytle Butte, MD   2 spray at 04/04/15 1038  . loratadine (CLARITIN) tablet 10 mg  10 mg Oral q morning - 10a Lytle Butte, MD   10 mg at 04/04/15 1037  . magnesium hydroxide (MILK OF MAGNESIA) suspension 30 mL  30 mL Oral BID PRN Lytle Butte, MD      . meropenem [facility]) 1 g in sodium chloride 0.9 % 100 mL IVPB  1 g Intravenous Q12H Lytle Butte, MD   1 g at 04/04/15 1040  . metoprolol succinate (TOPROL-XL) 24 hr tablet 50 mg  50 mg Oral BID Lytle Butte, MD   50 mg at 04/04/15 1038  . morphine 2 MG/ML injection 2 mg  2 mg Intravenous Q4H PRN Lytle Butte, MD      . ondansetron [facility]) tablet 4 mg  4 mg Oral Q6H PRN Lytle Butte, MD       Or  . ondansetron [facility]) injection 4 mg  4 mg Intravenous Q6H PRN Lytle Butte, MD      . polyethylene glycol (MIRALAX / GLYCOLAX)  packet 17 g  17 g Oral QHS Lytle Butte, MD   17 g at 04/03/15 2217  . senna-docusate (Senokot-S) tablet 2 tablet  2 tablet Oral BID PRN Lytle Butte, MD      . traZODone (DESYREL) tablet 50 mg  50 mg Oral QHS Lytle Butte, MD   50 mg at 04/03/15 2216  . vitamin C (ASCORBIC ACID) tablet 1,000 mg  1,000 mg Oral Daily Lytle Butte, MD   1,000 mg at 04/04/15 1000    Review of Systems See nursing notes  Objective:   Filed Vitals:   04/03/15 1606 04/03/15 2216 04/03/15 2345 04/04/15 0817  BP: 119/91 140/71 132/62 133/62  Pulse: 83 82 82 81  Temp: 98.3 F (36.8 C)  98.6 F (37 C) 98.1 F (36.7 C)  TempSrc: Oral  Oral Oral  Resp: 16  17 17   Height:      Weight:      SpO2: 100%  98% 96%      ECOG Performance Status: 3 - Symptomatic, >50% confined to bed  General appearance: alert and appears older than stated age HEENT: poor dentition otherwise normal.  Abdomen: soft, nontender, nondistended, no obvious masses or ascites. Neurological exam reveals the patient is alert, oriented, speech challenging, multiple contractures.   Pelvic: exam chaperoned by nurse;  Vulva: normal appearing vulva with no masses, tenderness or lesions; Vagina: normal vagina; Adnexa:nontender and no obvious masses but limited by exam; Uterus: not grossly enlarged and nontender but size and position could not be determined; Cervix: no cervical motion tenderness and no lesions; Rectal: not done  Lab Review None     Assessment:  Aaralyn Kil is a 81 y.o. female diagnosed with thickened endometrial stripe in the setting of a history of vaginal bleeding. Her imaging studies have also demonstrated urologic abnormalities. She has multiple medical co-morbidities complicating care.  Plan:   Problem List Items Addressed This Visit    Endometrial stripe thickness   We discussed options for management including continued observation with repeat ultrasound in 3 months versus operative evaluation with  D&C/hysteroscopy. Ms. Scales  stated that even if she was diagnosed with endometrial cancer she would not want to pursue additional treatment. While she does not have a diagnosis of endometrial cancer at this time, I felt it was important to review the possible treatment options for endometrial cancer such as surgery, radiation and hormonal therapy. I'm not certain if she would tolerate a major surgical procedure given her current comorbidities, and I think she should be high risk for perioperative complications. Radiation has the advantage of avoiding surgery, but is also associated with complications specific to radiation therapy such as enteritis, radiation cystitis, gastrointestinal toxicity. Hormonal therapy is an option for grade 1 and 2 disease, and is much better tolerated than surgery or radiation. Hormonal therapy is an excellent option for women who are medically frail. However these treatments do have side effects including thromboembolic events. Given that the patient does not feel that she would pursue additional treatment, even if she were to be diagnosed with endometrial cancer, I do not think that further invasive procedures are warranted at this time. I think her reasoning for not wanting to proceed with further evaluation and management is very reasonable and she seemed to be processing information correctly.  I also contacted her brother, Mr. Lizeth Bencosme (his cell phone is [phone]; home number is [phone]). Mr. Nault is her healthcare power of attorney. He and his wife seemed very concerned that his sister did not want to pursue additional evaluation. He will discuss this further with Dr. Glennon Mac and his sister. I provided his number so that he can contact me as well with further questions and concerns.    Gillis Ends, MD    CC:  Prentice Docker, MD Gae Dry, MD

## 2015-04-04 NOTE — Consult Note (Addendum)
Urology Consult  I have been asked to see the patient by Dr. Margaretmary Eddy, for evaluation and management of UTI/ progressive R>L hypronephrosis.  Chief Complaint: as above  History of Present Illness: Stacey Torres is a 81 y.o. year old admitted on 5/22 with fevers, foul-smelling urine and urinary tract infection.  Of note, she does have a history of ESBL Escherichia coli. She was started on broad-spectrum anabiotics (vancomycin/Zosyn). She denies any urinary symptoms including urinary frequency or dysuria. She reports that she was overall feeling unwell when she was brought in from [facility] [place] for assessment and the emergency room.  At baseline, she voids spontaneously into a diaper. She reports that she is unaware that she is urinating and has no bladder sensation. Her medical problems in her history and physical include neurogenic bladder, however, the patient is unaware of this diagnosis and is unable to provide much additional history. She may have seen a urologist in [place] at one point.  A CT of the abdomen and pelvis was obtained for further workup. This revealed severe right hydroureteronephrosis with cortical thinning which has progressed his previous imaging in 2014.  This extends way down to the level of the bladder. She also has moderate left hydronephrosis and her bladder is distended and mildly difusely thickened on CT scan.    She denies a history of kidney stones, dysuria, flank pain, but does have a remote history of urinary tract infections many years ago. She reports that recently, these have been significantly less frequent.    She does have a history of CP and rheumatoid arthritis and is severely contracted and bedbound.  During this admission, she was also worked up for postmenopausal bleeding by GYN.  Creatinine 0.5 on admission, currently down to 0.3 today.   Past Medical History  Diagnosis Date  . Cerebral palsy, hemiplegic     pt is intelligant  . Neurologic  gait dysfunction   . Spinal stenosis   . Urge urinary incontinence   . Hypertension   . Arthritis   . Neurogenic bladder   . Hydronephrosis, right   . Chronic cystitis   . Gross hematuria   . RA (rheumatoid arthritis)     advanced  . Flexion contracture joint of multiple sites     secondary to advanced ra  . Slurred speech   . Cancer skin ca    Past Surgical History  Procedure Laterality Date  . Total knee arthroplasty Right 2005  . Cholecystectomy  1995  . Cystoscopy with retrograde pyelogram, ureteroscopy and stent placement Bilateral 11/21/2013    Procedure: CYSTOSCOPY WITH RIGHT RETROGRADE PYELOGRAM, RIGHT FLEXIBLE and rigid URETEROSCOPY  PLACEMENT;  Surgeon: Bernestine Amass, MD;  Location: [facility];  Service: Urology;  Laterality: Bilateral;  . Cystogram  11/21/2013    Procedure: CYSTOGRAM;  Surgeon: Bernestine Amass, MD;  Location: [facility];  Service: Urology;;    Home Medications:    Medication List    ASK your doctor about these medications        acetaminophen 650 MG CR tablet  Commonly known as:  TYLENOL  Take 650 mg by mouth 2 (two) times daily as needed for pain.     acetaminophen 650 MG CR tablet  Commonly known as:  TYLENOL  Take 650 mg by mouth every 8 (eight) hours as needed for pain.     ALPRAZolam 0.25 MG tablet  Commonly known as:  XANAX  Take 0.25 mg by mouth 2 (  two) times daily as needed.     ALPRAZolam 0.25 MG tablet  Commonly known as:  XANAX  Take 0.25 mg by mouth 3 (three) times daily.     ALPRAZolam 0.25 MG tablet  Commonly known as:  XANAX  Take 0.25 mg by mouth 2 (two) times daily.     benzonatate 100 MG capsule  Commonly known as:  TESSALON  Take 200 mg by mouth 3 (three) times daily as needed for cough.     CALCIUM 600+D 600-400 MG-UNIT per tablet  Generic drug:  Calcium Carbonate-Vitamin D  Take 1 tablet by mouth every evening.     CERAVE Lotn  Apply topically.     desonide 0.05 % cream    Commonly known as:  DESOWEN  Apply topically.     dextromethorphan 15 MG/5ML syrup  Take 10 mLs by mouth 4 (four) times daily as needed for cough.     diphenhydrAMINE 25 MG tablet  Commonly known as:  SOMINEX  Take 25 mg by mouth 2 (two) times daily as needed for sleep.     DULoxetine 60 MG capsule  Commonly known as:  CYMBALTA  Take 60 mg by mouth every morning.     folic acid 1 MG tablet  Commonly known as:  FOLVITE  Take 1 mg by mouth daily.     gabapentin 100 MG capsule  Commonly known as:  NEURONTIN  Take 100 mg by mouth 2 (two) times daily.     hydroxychloroquine 200 MG tablet  Commonly known as:  PLAQUENIL  Take 200 mg by mouth 2 (two) times daily.     hydroxypropyl methylcellulose / hypromellose 2.5 % ophthalmic solution  Commonly known as:  ISOPTO TEARS / GONIOVISC  Place 2 drops into both eyes 2 (two) times daily as needed for dry eyes.     ipratropium 0.03 % nasal spray  Commonly known as:  ATROVENT  Place 2 sprays into both nostrils 2 (two) times daily.     ketoconazole 2 % cream  Commonly known as:  NIZORAL  Apply 1 application topically 2 (two) times daily.     loratadine 10 MG tablet  Commonly known as:  CLARITIN  Take 10 mg by mouth every morning.     magnesium hydroxide 400 MG/5ML suspension  Commonly known as:  MILK OF MAGNESIA  Take 30 mLs by mouth 2 (two) times daily as needed for mild constipation.     methotrexate 2.5 MG tablet  Commonly known as:  RHEUMATREX  Take 2.5 mg by mouth once a week. Caution:Chemotherapy. Protect from light.     metoprolol succinate 50 MG 24 hr tablet  Commonly known as:  TOPROL-XL  Take 50 mg by mouth 2 (two) times daily. Take with or immediately following a meal.     ondansetron 4 MG tablet  Commonly known as:  ZOFRAN  Take 4 mg by mouth every 4 (four) hours as needed for nausea or vomiting.     polyethylene glycol packet  Commonly known as:  MIRALAX / GLYCOLAX  Take 17 g by mouth at bedtime.      primidone 50 MG tablet  Commonly known as:  MYSOLINE  Take 50 mg by mouth 2 (two) times daily.     sennosides-docusate sodium 8.6-50 MG tablet  Commonly known as:  SENOKOT-S  Take 1 tablet by mouth at bedtime.     senna-docusate 8.6-50 MG per tablet  Commonly known as:  Senokot-S  Take 2 tablets by mouth 2 (  two) times daily as needed for mild constipation.     therapeutic multivitamin-minerals tablet  Take 1 tablet by mouth every morning.     traMADol 50 MG tablet  Commonly known as:  ULTRAM  Take by mouth every 8 (eight) hours as needed.     traZODone 50 MG tablet  Commonly known as:  DESYREL  Take 50 mg by mouth at bedtime.     vitamin C 1000 MG tablet  Take 1,000 mg by mouth daily.     zinc oxide 20 % ointment  Apply 1 application topically as needed for irritation.        Allergies: No Known Allergies  Family History  Problem Relation Age of Onset  . CAD Other     Social History:  reports that she quit smoking about 56 years ago. She has never used smokeless tobacco. She reports that she does not drink alcohol or use illicit drugs.  ROS: A complete review of systems was performed.  All systems are negative except for pertinent findings as noted.  Physical Exam:  Vital signs in last 24 hours: Temp:  [98.1 F (36.7 C)-98.6 F (37 C)] 98.1 F (36.7 C) (05/25 0817) Pulse Rate:  [81-83] 81 (05/25 0817) Resp:  [16-17] 17 (05/25 0817) BP: (119-140)/(62-91) 133/62 mmHg (05/25 0817) SpO2:  [96 %-100 %] 96 % (05/25 0817) Constitutional:  Alert and oriented, No acute distress.  Pleasant, helpful. HEENT: Vera AT, moist mucus membranes.  Trachea midline, no masses Cardiovascular: No clubbing, cyanosis, or edema. Respiratory: Normal respiratory effort. GI: Abdomen is soft, nontender, nondistended, no abdominal masses GU: No CVA tenderness Skin: No rashes, bruises or suspicious lesions Lymph: No cervical or inguinal adenopathy Neurologic: Grossly intact, no focal  deficits, moving all 4 extremities.   Psychiatric: Normal mood and affect MSK: Severe lower extremity contractures, limited   Laboratory Data:   Recent Labs  04/01/15 2020 04/02/15 0600 04/03/15 0637  WBC 8.3 6.2 6.4  HGB 10.5* 8.5* 8.7*  HCT 30.9* 25.9* 26.8*    Recent Labs  04/01/15 2020 04/02/15 0600 04/03/15 0637  NA 133* 139 137  K 4.3 3.4* 3.5  CL 93* 106 105  CO2 30 28 27   GLUCOSE 111* 87 84  BUN 14 10 9   CREATININE 0.51 0.36* 0.39*  CALCIUM 9.2 7.9* 8.1*   No results for input(s): LABPT, INR in the last 72 hours. No results for input(s): LABURIN in the last 72 hours. Results for orders placed or performed during the hospital encounter of 04/01/15  Culture, blood (routine x 2)     Status: None (Preliminary result)   Collection Time: 04/01/15  8:21 PM  Result Value Ref Range Status   Specimen Description BLOOD  Final   Special Requests NONE  Final   Culture NO GROWTH 2 DAYS  Final   Report Status PENDING  Incomplete  Urine culture     Status: None   Collection Time: 04/01/15  8:21 PM  Result Value Ref Range Status   Specimen Description URINE, CLEAN CATCH  Final   Special Requests NONE  Final   Culture   Final    MULTIPLE SPECIES PRESENT, SUGGEST RECOLLECTION IF CLINICALLY INDICATED   Report Status 04/03/2015 FINAL  Final  Culture, blood (routine x 2)     Status: None (Preliminary result)   Collection Time: 04/01/15  8:23 PM  Result Value Ref Range Status   Specimen Description BLOOD  Final   Special Requests NONE  Final   Culture  NO GROWTH 2 DAYS  Final   Report Status PENDING  Incomplete  MRSA PCR Screening     Status: None   Collection Time: 04/02/15 12:12 PM  Result Value Ref Range Status   MRSA by PCR NEGATIVE NEGATIVE Final    Comment:        The GeneXpert MRSA Assay (FDA approved for NASAL specimens only), is one component of a comprehensive MRSA colonization surveillance program. It is not intended to diagnose MRSA infection nor to  guide or monitor treatment for MRSA infections.      Radiologic Imaging: Ct Abdomen Pelvis W Contrast  04/03/2015   CLINICAL DATA:  Fever, dysuria, sepsis  EXAM: CT ABDOMEN AND PELVIS WITH CONTRAST  TECHNIQUE: Multidetector CT imaging of the abdomen and pelvis was performed using the standard protocol following bolus administration of intravenous contrast.  CONTRAST:  42mL OMNIPAQUE IOHEXOL 300 MG/ML  SOLN  COMPARISON:  10/28/2013, transabdominal pelvic ultrasound 03/13/2015  FINDINGS: Lower chest: Left hemidiaphragmatic elevation is noted with left basilar scarring or atelectasis. Trace right basilar dependent scarring or atelectasis. Moderate left pleural effusion.  Hepatobiliary: 1 cm cyst, dome of right hepatic lobe image 18. Mild central intrahepatic ductal dilatation is identified with common duct dilation measuring 1.1 cm image 31 and tapering to the ampulla. Status post cholecystectomy.  Pancreas: Atrophic but unremarkable.  Spleen: Normal  Adrenals/Urinary Tract: Severe right hydroureteronephrosis is noted with hydroureter extending to the level of the ureterovesicular junction. No radiopaque renal, ureteral, or bladder calculus. Right renal cortical thinning is identified with differential right renal hypo enhancement as compared to the left kidney, compatible with obstruction. Moderate left hydroureteronephrosis identified to the level of the bladder. No radiopaque left renal or ureteral calculus. These findings were present on the prior exam of 2014 but have progressed since the prior exam.  Stomach/Bowel: Stomach contained within left upper quadrant with left hemidiaphragmatic elevation. No evidence for bowel obstruction or wall thickening. The appendix is not visualized but there is no secondary evidence for acute appendicitis.  Vascular/Lymphatic: Moderate atheromatous aortic calcification without aneurysm. No lymphadenopathy.  Reproductive: Uterus and ovaries appear normal.  Other: The  bladder is distended with concentric wall thickening at upper limits of normal measuring 3 mm image 70. No free fluid or free air.  Musculoskeletal: Severe leftward scoliosis of the thoracolumbar spine with multilevel disc degenerative change identified. Compression deformities at L5, L1, and T11 are noted, with T11 compression deformity new since the prior study but otherwise age indeterminate.  IMPRESSION: Progression of previously seen right greater than left hydroureteronephrosis with mild concentric bladder wall thickening. Hypo enhancement of the right kidney is compatible with obstructive uropathy. This could be due to chronic cystitis, infiltrative bladder malignancy, non radiopaque stones, or strictures.  Slight progression of intrahepatic ductal dilatation and common duct dilatation status post cholecystectomy, with tapering to the ampulla.   Electronically Signed   By: Conchita Paris M.D.   On: 04/03/2015 18:23   Procedure: Due to severe contractures, Foley unable placed by nursing staff. Patient was placed in optimal position based on limitations due to contractures. She was prepped using the standard sterile fashion using Betadine solution. Unfortunately, I was unable to directly visualize urethral meatus. I did place a digit within the vagina as a placeholder and was able to probe until the catheter was successfully placed per urethra.  Upon catheter placement, initially there was return of clear yellow urine, approximately 500 cc with an additional 50 cc of frank pus. The balloon  was inflated with 10 cc of sterile water. The catheter was secured to the patient's leg.  Impression/Assessment:  1) UTI- urine culture from admission grew mixed species. Unclear how culture was obtained given difficult catheterization and suspect suboptimal collection. Recommend repeat urine culture from Foley.  Urine EXTREMELY purulent upon catheter placement.  Currently on Augmentin.   2) Urinary incontinence/  neurogenic bladder- suspect patient may be an overflow incontinence given relatively large volume upon catheter placement and lack of bladder sensation/voiding awareness.  3) Bilateral hydronephrosis, right greater than left with preserved renal function - hydronephrosis appears to be chronic in nature given cortical thinning on the right.  I have recommended Foley catheter for maximal decompression of her upper tract collecting system to remain in place for several weeks with follow-up renal ultrasound to see if her hydronephrosis improves.  I suspect her hydronephrosis need be related to chronic urinary retention, reflux from a high pressure bladder. This can be performed as an outpatient and follow-up will need to be arranged.  Plan:  1) Please repeat urine culture  2) Foley placed for maximal decompression- to remain in place upon discharge.  3) Follow-up in urology office in 3-4 weeks with a renal ultrasound prior to assess for improvement or resolution of hydronephrosis.  04/04/2015, 11:50 AM  Hollice Espy,  MD   Thank you for involving me in this patient's care. Please page with any further questions or concerns.

## 2015-04-04 NOTE — Progress Notes (Signed)
[place] [facility] [place] NAME: Stacey Torres    MR#:  [id number]  DATE OF BIRTH:  07/10/1929  SUBJECTIVE:  CHIEF COMPLAINT:  Fever and dysuria Resting comfortably, had pelvic exam today .  REVIEW OF SYSTEMS:  CONSTITUTIONAL: No fever, fatigue or weakness.  EYES: No blurred or double vision.  EARS, NOSE, AND THROAT: No tinnitus or ear pain.  RESPIRATORY: No cough, shortness of breath, wheezing or hemoptysis.  CARDIOVASCULAR: No chest pain, orthopnea, edema.  GASTROINTESTINAL: No nausea, vomiting, diarrhea or abdominal pain.  GENITOURINARY: No dysuria, hematuria.  ENDOCRINE: No polyuria, nocturia,  HEMATOLOGY: No anemia, easy bruising or bleeding SKIN: No rash or lesion. MUSCULOSKELETAL: No joint pain .   NEUROLOGIC. Has cerebral palsy PSYCHIATRY: No anxiety or depression.   DRUG ALLERGIES:  No Known Allergies  VITALS:  Blood pressure 139/68, pulse 79, temperature 98.7 F (37.1 C), temperature source Oral, resp. rate 17, height 5\' 3"  (1.6 m), weight 51.619 kg (113 lb 12.8 oz), SpO2 96 %.  PHYSICAL EXAMINATION:  GENERAL:  81 y.o.-year-old patient lying in the bed with no acute distress.  EYES: Pupils equal, round, reactive to light and accommodation. No scleral icterus. Extraocular muscles intact.  HEENT: Head atraumatic, normocephalic. Oropharynx and nasopharynx clear.  NECK:  Supple, no jugular venous distention. No thyroid enlargement, no tenderness.  LUNGS: Normal breath sounds bilaterally, no wheezing, rales,rhonchi or crepitation. No use of accessory muscles of respiration.  CARDIOVASCULAR: S1, S2 normal. No murmurs, rubs, or gallops.  ABDOMEN: Soft, nontender, nondistended. Bowel sounds present. No organomegaly or mass.  EXTREMITIES: No pedal edema, cyanosis, or clubbing.  NEUROLOGIC: Contractures from cerebral palsy. Sensation intact. Gait not checked.  PSYCHIATRIC: The patient is awake, n  alert and oriented x 3.   SKIN: No obvious rash, lesion, or ulcer.    LABORATORY PANEL:   CBC  Recent Labs Lab 04/03/15 0637  WBC 6.4  HGB 8.7*  HCT 26.8*  PLT 328   ------------------------------------------------------------------------------------------------------------------  Chemistries   Recent Labs Lab 04/01/15 2020  04/03/15 0637  NA 133*  < > 137  K 4.3  < > 3.5  CL 93*  < > 105  CO2 30  < > 27  GLUCOSE 111*  < > 84  BUN 14  < > 9  CREATININE 0.51  < > 0.39*  CALCIUM 9.2  < > 8.1*  AST 17  --   --   ALT 7*  --   --   ALKPHOS 65  --   --   BILITOT 0.2*  --   --   < > = values in this interval not displayed. ------------------------------------------------------------------------------------------------------------------  Cardiac Enzymes No results for input(s): TROPONINI in the last 168 hours. ------------------------------------------------------------------------------------------------------------------  RADIOLOGY:  Ct Abdomen Pelvis W Contrast  04/03/2015   CLINICAL DATA:  Fever, dysuria, sepsis  EXAM: CT ABDOMEN AND PELVIS WITH CONTRAST  TECHNIQUE: Multidetector CT imaging of the abdomen and pelvis was performed using the standard protocol following bolus administration of intravenous contrast.  CONTRAST:  49mL OMNIPAQUE IOHEXOL 300 MG/ML  SOLN  COMPARISON:  10/28/2013, transabdominal pelvic ultrasound 03/13/2015  FINDINGS: Lower chest: Left hemidiaphragmatic elevation is noted with left basilar scarring or atelectasis. Trace right basilar dependent scarring or atelectasis. Moderate left pleural effusion.  Hepatobiliary: 1 cm cyst, dome of right hepatic lobe image 18. Mild central intrahepatic ductal dilatation is identified with common duct dilation measuring 1.1 cm image 31 and tapering to the ampulla. Status post cholecystectomy.  Pancreas: Atrophic but unremarkable.  Spleen: Normal  Adrenals/Urinary Tract: Severe right hydroureteronephrosis is noted with hydroureter extending to  the level of the ureterovesicular junction. No radiopaque renal, ureteral, or bladder calculus. Right renal cortical thinning is identified with differential right renal hypo enhancement as compared to the left kidney, compatible with obstruction. Moderate left hydroureteronephrosis identified to the level of the bladder. No radiopaque left renal or ureteral calculus. These findings were present on the prior exam of 2014 but have progressed since the prior exam.  Stomach/Bowel: Stomach contained within left upper quadrant with left hemidiaphragmatic elevation. No evidence for bowel obstruction or wall thickening. The appendix is not visualized but there is no secondary evidence for acute appendicitis.  Vascular/Lymphatic: Moderate atheromatous aortic calcification without aneurysm. No lymphadenopathy.  Reproductive: Uterus and ovaries appear normal.  Other: The bladder is distended with concentric wall thickening at upper limits of normal measuring 3 mm image 70. No free fluid or free air.  Musculoskeletal: Severe leftward scoliosis of the thoracolumbar spine with multilevel disc degenerative change identified. Compression deformities at L5, L1, and T11 are noted, with T11 compression deformity new since the prior study but otherwise age indeterminate.  IMPRESSION: Progression of previously seen right greater than left hydroureteronephrosis with mild concentric bladder wall thickening. Hypo enhancement of the right kidney is compatible with obstructive uropathy. This could be due to chronic cystitis, infiltrative bladder malignancy, non radiopaque stones, or strictures.  Slight progression of intrahepatic ductal dilatation and common duct dilatation status post cholecystectomy, with tapering to the ampulla.   Electronically Signed   By: Conchita Paris M.D.   On: 04/03/2015 18:23    EKG:   Orders placed or performed during the hospital encounter of 04/01/15  . EKG 12-Lead  . EKG 12-Lead    ASSESSMENT AND  PLAN:    81 year old Caucasian female history of rheumatoid arthritis, essential hypertension presenting one-day duration fever.  1. Sepsis, meeting septic criteria by temperature, respiratory rate, heart rate present on arrival. Secondary to acute cystitis . Panculture.  Broad-spectrum antibiotics including vancomycin/Zosyn given in emergency department however will cover with meropenem given history of ESBL Escherichia coli. Urine cx with mixed pathogens - contaminant D/e meropenem , give augmentin for 14 days  * Progressive rt>lt hydroureteronephrosis with obstructive uropathy seeem to be chronic uro - recs foley for 4 weeks and op f/u  *. Essential hypertension, metoprolol  3. Rheumatoid arthritis: Plaquenil  4. Hypokalemia-replace and check the BMP in a.m.  5.Venous thromboembolism prophylactic heparin subcutaneous  6.Generalized weakness with history of cerebral palsy and hemiplegia-PT consult placed for evaluation and treatment as tolerated only  7. Vaginal bleeding /infection - appreciate Ob/gyn consult, CT abdomen and pelvis done   GYN oncology dr. Theora Gianotti had a long d/w pt regarding endometrial cancer possibility, pt doesn't want to pursue any rx , if dx with endometrial ca, but brother POA wants to pursue  Further, will d/w brother and d/c pt home   All the records are reviewed and case discussed with Care Management/Social Workerr. Management plans discussed with the patient, family and they are in agreement.  CODE STATUS: DO NOT RESUSCITATE  TOTAL TIME TAKING CARE OF THIS PATIENT: 35 minutes.   POSSIBLE D/C IN 2-3 DAYS, DEPENDING ON CLINICAL CONDITION.   Nicholes Mango M.D on 04/04/2015 at 10:34 PM  Between 7am to 6pm  Office  [phone]  CC: Primary care physician; Viviana Simpler, MD

## 2015-04-04 NOTE — Progress Notes (Signed)
ANTIBIOTIC CONSULT NOTE - INITIAL  Pharmacy Consult for meropenem dosing  Indication: UTI/sepsis  No Known Allergies  Patient Measurements: Height: 5\' 3"  (160 cm) Weight: 113 lb 12.8 oz (51.619 kg) IBW/kg (Calculated) : 52.4 Adjusted Body Weight: n/a  Vital Signs: Temp: 98.1 F (36.7 C) (05/25 0817) Temp Source: Oral (05/25 0817) BP: 133/62 mmHg (05/25 0817) Pulse Rate: 81 (05/25 0817) Intake/Output from previous day: 05/24 0701 - 05/25 0700 In: 3118.5 [P.O.:537; I.V.:2481.5; IV Piggyback:100] Out: -  Intake/Output from this shift:    Labs:  Recent Labs  04/01/15 2020 04/02/15 0600 04/03/15 0637  WBC 8.3 6.2 6.4  HGB 10.5* 8.5* 8.7*  PLT 455* 331 328  CREATININE 0.51 0.36* 0.39*   Estimated Creatinine Clearance: 43.4 mL/min (by C-G formula based on Cr of 0.39). No results for input(s): VANCOTROUGH, VANCOPEAK, VANCORANDOM, GENTTROUGH, GENTPEAK, GENTRANDOM, TOBRATROUGH, TOBRAPEAK, TOBRARND, AMIKACINPEAK, AMIKACINTROU, AMIKACIN in the last 72 hours.   Microbiology: Recent Results (from the past 720 hour(s))  Culture, blood (routine x 2)     Status: None (Preliminary result)   Collection Time: 04/01/15  8:21 PM  Result Value Ref Range Status   Specimen Description BLOOD  Final   Special Requests NONE  Final   Culture NO GROWTH 2 DAYS  Final   Report Status PENDING  Incomplete  Urine culture     Status: None   Collection Time: 04/01/15  8:21 PM  Result Value Ref Range Status   Specimen Description URINE, CLEAN CATCH  Final   Special Requests NONE  Final   Culture   Final    MULTIPLE SPECIES PRESENT, SUGGEST RECOLLECTION IF CLINICALLY INDICATED   Report Status 04/03/2015 FINAL  Final  Culture, blood (routine x 2)     Status: None (Preliminary result)   Collection Time: 04/01/15  8:23 PM  Result Value Ref Range Status   Specimen Description BLOOD  Final   Special Requests NONE  Final   Culture NO GROWTH 2 DAYS  Final   Report Status PENDING  Incomplete  MRSA  PCR Screening     Status: None   Collection Time: 04/02/15 12:12 PM  Result Value Ref Range Status   MRSA by PCR NEGATIVE NEGATIVE Final    Comment:        The GeneXpert MRSA Assay (FDA approved for NASAL specimens only), is one component of a comprehensive MRSA colonization surveillance program. It is not intended to diagnose MRSA infection nor to guide or monitor treatment for MRSA infections.     Medical History: Past Medical History  Diagnosis Date  . Cerebral palsy, hemiplegic     pt is intelligant  . Neurologic gait dysfunction   . Spinal stenosis   . Urge urinary incontinence   . Hypertension   . Arthritis   . Neurogenic bladder   . Hydronephrosis, right   . Chronic cystitis   . Gross hematuria   . RA (rheumatoid arthritis)     advanced  . Flexion contracture joint of multiple sites     secondary to advanced ra  . Slurred speech   . Cancer skin ca    Medications:   Assessment: BCx: Ng x 2, UCx: multiple species present/consider recollection UA: LE(+) NO2(-) WBC TNTC CXR: atelectasis   Goal of Therapy:  Resolution of infection  Plan:  Continue current orders for Meropenem 1 gm IV q12h based on renal function.  Murrell Converse, PharmD. BCPS Clinical Pharmacist 04/04/2015

## 2015-04-04 NOTE — Plan of Care (Signed)
Problem: Phase II Progression Outcomes Goal: Progress activity as tolerated unless otherwise ordered Patient has no acute event overnight. She remained NS in the 70s with VS WDL for patient. Patient has a maintenance IV infusing overnight. Patient was repositioned Q2 and as needed .

## 2015-04-05 LAB — CBC
HCT: 27.5 % — ABNORMAL LOW (ref 35.0–47.0)
Hemoglobin: 8.7 g/dL — ABNORMAL LOW (ref 12.0–16.0)
MCH: 31.8 pg (ref 26.0–34.0)
MCHC: 31.8 g/dL — AB (ref 32.0–36.0)
MCV: 100 fL (ref 80.0–100.0)
PLATELETS: 289 10*3/uL (ref 150–440)
RBC: 2.75 MIL/uL — ABNORMAL LOW (ref 3.80–5.20)
RDW: 14.5 % (ref 11.5–14.5)
WBC: 5.1 10*3/uL (ref 3.6–11.0)

## 2015-04-05 MED ORDER — TRAMADOL HCL 50 MG PO TABS: 50.0000 mg | ORAL_TABLET | Freq: Three times a day (TID) | ORAL | Status: DC | PRN

## 2015-04-05 MED ORDER — ALPRAZOLAM 0.25 MG PO TABS: 0.2500 mg | ORAL_TABLET | Freq: Two times a day (BID) | ORAL | Status: DC | PRN

## 2015-04-05 MED ORDER — AMOXICILLIN-POT CLAVULANATE 875-125 MG PO TABS: 1.0000 | ORAL_TABLET | Freq: Two times a day (BID) | ORAL | Status: AC

## 2015-04-05 MED ORDER — ENSURE ENLIVE PO LIQD: 237.0000 mL | Freq: Two times a day (BID) | ORAL | Status: AC

## 2015-04-05 MED ORDER — ALPRAZOLAM 0.25 MG PO TABS: 0.2500 mg | ORAL_TABLET | Freq: Three times a day (TID) | ORAL | Status: DC

## 2015-04-05 NOTE — Clinical Social Work Note (Signed)
Patient is to discharge to return to [facility] today. Patient's niece, Jeannene Patella, is [employer], technical sales and she is the contact for patient. CSW contacted Pam to inform her of patient's discharge today. Pam and Seth Bake at [facility] have arranged transportation for patient. Discharge information sent to [facility]. Shela Leff MSW,LCSWA [phone]

## 2015-04-05 NOTE — Discharge Summary (Signed)
[facility] NAME: Stacey Torres    MR#:  [id number]  DATE OF BIRTH:  1929-01-14  DATE OF ADMISSION:  04/01/2015 ADMITTING PHYSICIAN: Lytle Butte, MD  DATE OF DISCHARGE: 04/05/2015 PRIMARY CARE PHYSICIAN: Viviana Simpler, MD    ADMISSION DIAGNOSIS:  Fever [R50.9] Urinary tract infection, acute [N39.0] Sepsis, due to unspecified organism [A41.9]  DISCHARGE DIAGNOSIS:  Active Problems:   Sepsis due to urinary tract infection   Rheumatoid arthritis   Essential hypertension   Malnutrition of moderate degree   Endometrial thickening on ultra sound Progressive hydroureteronephrosis   SECONDARY DIAGNOSIS:   Past Medical History  Diagnosis Date  . Cerebral palsy, hemiplegic     pt is intelligant  . Neurologic gait dysfunction   . Spinal stenosis   . Urge urinary incontinence   . Hypertension   . Arthritis   . Neurogenic bladder   . Hydronephrosis, right   . Chronic cystitis   . Gross hematuria   . RA (rheumatoid arthritis)     advanced  . Flexion contracture joint of multiple sites     secondary to advanced ra  . Slurred speech   . Cancer skin ca    HOSPITAL COURSE:  Brief history and physical Please review history and physical for complete details. Patient is brought into the ED with a chief complaint of fever and chills with foul-smelling urine. Has previous history of ESBL Escherichia coli. In the ED patient was given IV fluids and started on IV antibiotics with vancomycin and IV Zosyn.  Hospital course  1. Sepsis, meeting septic criteria by temperature, respiratory rate, heart rate present on arrival. Secondary to acute cystitis . Panculture.  Broad-spectrum antibiotics including vancomycin/Zosyn given in emergency department however will cover with meropenem given history of ESBL Escherichia coli. Urine cx with mixed pathogens - contaminant D/ed meropenem , discussed with infectious disease Dr.  Ola Spurr, on Side they have recommended to give augmentin for 14 days  * Progressive rt>lt hydroureteronephrosis with obstructive uropathy seeem to be chronic her urology Dr. Dareen Piano - recs foley for 4 weeks and op f/u Patient prefers going to her  urologist at [facility] is to continue Foley catheter with leg bag until patient is seen and evaluated by urology  *. Essential hypertension, metoprolol  3. Rheumatoid arthritis: Plaquenil  4. Hypokalemia-replaced   5.Venous thromboembolism prophylactic heparin subcutaneous  6.Generalized weakness with history of cerebral palsy and hemiplegia-PT consult placed for evaluation and treatment , we have recommended the patient to be discharged to skilled nursing care and continue physical therapy  7. Vaginal bleeding /infection - seen by Ob/gyn consult, CT abdomen and pelvis done  GYN oncology dr. Theora Gianotti did Pelvic exam and had a long d/w pt regarding endometrial cancer possibility, pt doesn't want to pursue any rx , or investigations initially but after my discussion she changes her mind and wants to pursue more and would like to follow up with GYN oncology as an outpatient for more investigations. This is discussed with GYN oncology Dr. Theora Gianotti in detail, she is aware of that. She has recommended the patient to follow-up with her at [place]. This was discussed with the patient's niece she is aware of the treatment plan  DISCHARGE CONDITIONS:   Satisfactory  CONSULTS OBTAINED:  Treatment Team:  Lytle Butte, MD [facility] Gaetana Michaelis, MD Lloyd Huger, MD Gae Dry, MD Hollice Espy, MD   PROCEDURES Foley catheter placement by  urology Dr. Erlene Quan Pelvic exam by GYN oncology  DRUG ALLERGIES:  No Known Allergies  DISCHARGE MEDICATIONS:   Current Discharge Medication List    START taking these medications   Details  amoxicillin-clavulanate (AUGMENTIN) 875-125 MG per tablet Take 1 tablet by mouth every 12  (twelve) hours. Qty: 28 tablet, Refills: 0    feeding supplement, ENSURE ENLIVE, (ENSURE ENLIVE) LIQD Take 237 mLs by mouth 2 (two) times daily between meals. Qty: 237 mL, Refills: 12      CONTINUE these medications which have CHANGED   Details  !! ALPRAZolam (XANAX) 0.25 MG tablet Take 1 tablet (0.25 mg total) by mouth 2 (two) times daily as needed. Qty: 10 tablet, Refills: 0    !! ALPRAZolam (XANAX) 0.25 MG tablet Take 1 tablet (0.25 mg total) by mouth 3 (three) times daily. Qty: 30 tablet, Refills: 0    traMADol (ULTRAM) 50 MG tablet Take 1 tablet (50 mg total) by mouth every 8 (eight) hours as needed. Qty: 15 tablet, Refills: 0     !! - Potential duplicate medications found. Please discuss with provider.    CONTINUE these medications which have NOT CHANGED   Details  acetaminophen (TYLENOL) 650 MG CR tablet Take 650 mg by mouth 2 (two) times daily as needed for pain.    Ascorbic Acid (VITAMIN C) 1000 MG tablet Take 1,000 mg by mouth daily.    benzonatate (TESSALON) 100 MG capsule Take 200 mg by mouth 3 (three) times daily as needed for cough.    Calcium Carbonate-Vitamin D (CALCIUM 600+D) 600-400 MG-UNIT per tablet Take 1 tablet by mouth every evening.    desonide (DESOWEN) 0.05 % cream Apply topically.    diphenhydrAMINE (SOMINEX) 25 MG tablet Take 25 mg by mouth 2 (two) times daily as needed for sleep.    DULoxetine (CYMBALTA) 60 MG capsule Take 60 mg by mouth every morning.    Emollient (CERAVE) LOTN Apply topically.    folic acid (FOLVITE) 1 MG tablet Take 1 mg by mouth daily.    hydroxychloroquine (PLAQUENIL) 200 MG tablet Take 200 mg by mouth 2 (two) times daily.    hydroxypropyl methylcellulose / hypromellose (ISOPTO TEARS / GONIOVISC) 2.5 % ophthalmic solution Place 2 drops into both eyes 2 (two) times daily as needed for dry eyes.    ipratropium (ATROVENT) 0.03 % nasal spray Place 2 sprays into both nostrils 2 (two) times daily.    ketoconazole (NIZORAL) 2 %  cream Apply 1 application topically 2 (two) times daily.    loratadine (CLARITIN) 10 MG tablet Take 10 mg by mouth every morning.     magnesium hydroxide (MILK OF MAGNESIA) 400 MG/5ML suspension Take 30 mLs by mouth 2 (two) times daily as needed for mild constipation.     methotrexate (RHEUMATREX) 2.5 MG tablet Take 2.5 mg by mouth once a week. Caution:Chemotherapy. Protect from light.    metoprolol succinate (TOPROL-XL) 50 MG 24 hr tablet Take 50 mg by mouth 2 (two) times daily. Take with or immediately following a meal.    ondansetron (ZOFRAN) 4 MG tablet Take 4 mg by mouth every 4 (four) hours as needed for nausea or vomiting.     polyethylene glycol (MIRALAX / GLYCOLAX) packet Take 17 g by mouth at bedtime.    primidone (MYSOLINE) 50 MG tablet Take 50 mg by mouth 2 (two) times daily.     !! senna-docusate (SENOKOT-S) 8.6-50 MG per tablet Take 2 tablets by mouth 2 (two) times daily as needed for mild  constipation.    !! sennosides-docusate sodium (SENOKOT-S) 8.6-50 MG tablet Take 1 tablet by mouth at bedtime.    therapeutic multivitamin-minerals (THERAGRAN-M) tablet Take 1 tablet by mouth every morning.    traZODone (DESYREL) 50 MG tablet Take 50 mg by mouth at bedtime.    zinc oxide 20 % ointment Apply 1 application topically as needed for irritation.    dextromethorphan 15 MG/5ML syrup Take 10 mLs by mouth 4 (four) times daily as needed for cough.    gabapentin (NEURONTIN) 100 MG capsule Take 100 mg by mouth 2 (two) times daily.     !! - Potential duplicate medications found. Please discuss with provider.       DISCHARGE INSTRUCTIONS:   Continue Foley catheter with leg bag until seen and evaluated by urology Follow-up with primary care physician in a week. Follow-up with urology in a week. Follow-up with GYN oncology Dr. Theora Gianotti in a week at [place]  DIET:  Dale:  Satisfactory  ACTIVITY:  As tolerated, as recommended by physical  therapy at skilled nursing care  OXYGEN:  Home Oxygen: No     DISCHARGE LOCATION:  Skilled nursing care and continue PT   If you experience worsening of your admission symptoms, develop shortness of breath, life threatening emergency, suicidal or homicidal thoughts you must seek medical attention immediately by calling 911 or calling your MD immediately  if symptoms less severe.  You Must read complete instructions/literature along with all the possible adverse reactions/side effects for all the Medicines you take and that have been prescribed to you. Take any new Medicines after you have completely understood and accpet all the possible adverse reactions/side effects.   Please note  You were cared for by a hospitalist during your hospital stay. If you have any questions about your discharge medications or the care you received while you were in the hospital after you are discharged, you can call the unit and asked to speak with the hospitalist on call if the hospitalist that took care of you is not available. Once you are discharged, your primary care physician will handle any further medical issues. Please note that NO REFILLS for any discharge medications will be authorized once you are discharged, as it is imperative that you return to your primary care physician (or establish a relationship with a primary care physician if you do not have one) for your aftercare needs so that they can reassess your need for medications and monitor your lab values.     Today  Chief Complaint  Patient presents with  . Dysuria  . Fever   patient is resting comfortably with no new complaints. Wants to be discharged to skilled nursing care as soon as possible, agreeable for further investigations regarding her endometrial thickening   ROS:  CONSTITUTIONAL: Denies fevers, chills. Denies any fatigue, weakness.  EYES: Denies blurry vision, double vision, eye pain. EARS, NOSE, THROAT: Denies tinnitus, ear  pain, hearing loss. RESPIRATORY: Denies cough, wheeze, shortness of breath.  CARDIOVASCULAR: Denies chest pain, palpitations, edema.  GASTROINTESTINAL: Denies nausea, vomiting, diarrhea, abdominal pain. Denies bright red blood per rectum. GENITOURINARY: Denies dysuria, hematuria. ENDOCRINE: Denies nocturia or thyroid problems. HEMATOLOGIC AND LYMPHATIC: Denies easy bruising or bleeding. SKIN: Denies rash or lesion. MUSCULOSKELETAL: Denies pain in neck, back, shoulder, knees, hips or arthritic symptoms.  NEUROLOGIC: Denies paralysis, paresthesias.  PSYCHIATRIC: Denies anxiety or depressive symptoms.   VITAL SIGNS:  Blood pressure 135/62, pulse 75, temperature 98.3 F (36.8 C), temperature source  Oral, resp. rate 18, height 5\' 3"  (1.6 m), weight 51.619 kg (113 lb 12.8 oz), SpO2 95 %.  I/O:   Intake/Output Summary (Last 24 hours) at 04/05/15 1338 Last data filed at 04/05/15 1100  Gross per 24 hour  Intake   3280 ml  Output   2350 ml  Net    930 ml    PHYSICAL EXAMINATION:  GENERAL:  81 y.o.-year-old patient lying in the bed with no acute distress.  EYES: Pupils equal, round, reactive to light and accommodation. No scleral icterus. Extraocular muscles intact.  HEENT: Head atraumatic, normocephalic. Oropharynx and nasopharynx clear.  NECK:  Supple, no jugular venous distention. No thyroid enlargement, no tenderness.  LUNGS: Normal breath sounds bilaterally, no wheezing, rales,rhonchi or crepitation. No use of accessory muscles of respiration.  CARDIOVASCULAR: S1, S2 normal. No murmurs, rubs, or gallops.  ABDOMEN: Soft, non-tender, non-distended. Bowel sounds present. No organomegaly or mass.  EXTREMITIES: No pedal edema, cyanosis, or clubbing.  NEUROLOGIC: Cranial nerves II through XII are intact. Muscle strength 5/5 in all extremities. Sensation intact. Gait not checked.  PSYCHIATRIC: The patient is alert and oriented x 3.  SKIN: No obvious rash, lesion, or ulcer.   DATA REVIEW:    CBC  Recent Labs Lab 04/05/15 0623  WBC 5.1  HGB 8.7*  HCT 27.5*  PLT 289    Chemistries   Recent Labs Lab 04/01/15 2020  04/03/15 0637  NA 133*  < > 137  K 4.3  < > 3.5  CL 93*  < > 105  CO2 30  < > 27  GLUCOSE 111*  < > 84  BUN 14  < > 9  CREATININE 0.51  < > 0.39*  CALCIUM 9.2  < > 8.1*  AST 17  --   --   ALT 7*  --   --   ALKPHOS 65  --   --   BILITOT 0.2*  --   --   < > = values in this interval not displayed.  Cardiac Enzymes No results for input(s): TROPONINI in the last 168 hours.  Microbiology Results  Results for orders placed or performed during the hospital encounter of 04/01/15  Culture, blood (routine x 2)     Status: None (Preliminary result)   Collection Time: 04/01/15  8:21 PM  Result Value Ref Range Status   Specimen Description BLOOD  Final   Special Requests NONE  Final   Culture NO GROWTH 4 DAYS  Final   Report Status PENDING  Incomplete  Urine culture     Status: None   Collection Time: 04/01/15  8:21 PM  Result Value Ref Range Status   Specimen Description URINE, CLEAN CATCH  Final   Special Requests NONE  Final   Culture   Final    MULTIPLE SPECIES PRESENT, SUGGEST RECOLLECTION IF CLINICALLY INDICATED   Report Status 04/03/2015 FINAL  Final  Culture, blood (routine x 2)     Status: None (Preliminary result)   Collection Time: 04/01/15  8:23 PM  Result Value Ref Range Status   Specimen Description BLOOD  Final   Special Requests NONE  Final   Culture NO GROWTH 4 DAYS  Final   Report Status PENDING  Incomplete  MRSA PCR Screening     Status: None   Collection Time: 04/02/15 12:12 PM  Result Value Ref Range Status   MRSA by PCR NEGATIVE NEGATIVE Final    Comment:        The GeneXpert MRSA  Assay (FDA approved for NASAL specimens only), is one component of a comprehensive MRSA colonization surveillance program. It is not intended to diagnose MRSA infection nor to guide or monitor treatment for MRSA infections.      RADIOLOGY:  Ct Abdomen Pelvis W Contrast  04/03/2015   CLINICAL DATA:  Fever, dysuria, sepsis  EXAM: CT ABDOMEN AND PELVIS WITH CONTRAST  TECHNIQUE: Multidetector CT imaging of the abdomen and pelvis was performed using the standard protocol following bolus administration of intravenous contrast.  CONTRAST:  51mL OMNIPAQUE IOHEXOL 300 MG/ML  SOLN  COMPARISON:  10/28/2013, transabdominal pelvic ultrasound 03/13/2015  FINDINGS: Lower chest: Left hemidiaphragmatic elevation is noted with left basilar scarring or atelectasis. Trace right basilar dependent scarring or atelectasis. Moderate left pleural effusion.  Hepatobiliary: 1 cm cyst, dome of right hepatic lobe image 18. Mild central intrahepatic ductal dilatation is identified with common duct dilation measuring 1.1 cm image 31 and tapering to the ampulla. Status post cholecystectomy.  Pancreas: Atrophic but unremarkable.  Spleen: Normal  Adrenals/Urinary Tract: Severe right hydroureteronephrosis is noted with hydroureter extending to the level of the ureterovesicular junction. No radiopaque renal, ureteral, or bladder calculus. Right renal cortical thinning is identified with differential right renal hypo enhancement as compared to the left kidney, compatible with obstruction. Moderate left hydroureteronephrosis identified to the level of the bladder. No radiopaque left renal or ureteral calculus. These findings were present on the prior exam of 2014 but have progressed since the prior exam.  Stomach/Bowel: Stomach contained within left upper quadrant with left hemidiaphragmatic elevation. No evidence for bowel obstruction or wall thickening. The appendix is not visualized but there is no secondary evidence for acute appendicitis.  Vascular/Lymphatic: Moderate atheromatous aortic calcification without aneurysm. No lymphadenopathy.  Reproductive: Uterus and ovaries appear normal.  Other: The bladder is distended with concentric wall thickening at upper  limits of normal measuring 3 mm image 70. No free fluid or free air.  Musculoskeletal: Severe leftward scoliosis of the thoracolumbar spine with multilevel disc degenerative change identified. Compression deformities at L5, L1, and T11 are noted, with T11 compression deformity new since the prior study but otherwise age indeterminate.  IMPRESSION: Progression of previously seen right greater than left hydroureteronephrosis with mild concentric bladder wall thickening. Hypo enhancement of the right kidney is compatible with obstructive uropathy. This could be due to chronic cystitis, infiltrative bladder malignancy, non radiopaque stones, or strictures.  Slight progression of intrahepatic ductal dilatation and common duct dilatation status post cholecystectomy, with tapering to the ampulla.   Electronically Signed   By: Conchita Paris M.D.   On: 04/03/2015 18:23   Dg Chest Port 1 View  04/01/2015   CLINICAL DATA:  Nausea and vomiting, fever beginning yesterday. History of spinal stenosis, cerebral palsy.  EXAM: PORTABLE CHEST - 1 VIEW  COMPARISON:  None available for comparison at time of study interpretation.  FINDINGS: Elevated LEFT hemidiaphragm. LEFT lung base strandy densities. No pleural effusion or focal consolidation. Cardiac silhouette appears normal in size. Calcified aorta. Patient is rotated local RIGHT. No pneumothorax. Severe degenerative change of the RIGHT shoulder. T12 severe compression fracture, incompletely imaged.  IMPRESSION: Elevated LEFT hemidiaphragm with atelectasis.  Severe T12 compression fracture incompletely imaged.   Electronically Signed   By: Elon Alas   On: 04/01/2015 21:10    EKG:   Orders placed or performed during the hospital encounter of 04/01/15  . EKG 12-Lead  . EKG 12-Lead      Management plans discussed with the patient,  family and they are in agreement.  CODE STATUS:     Code Status Orders        Start     Ordered   04/01/15 2207  Do not  attempt resuscitation (DNR)   Continuous    Question Answer Comment  In the event of cardiac or respiratory ARREST Do not call a "code blue"   In the event of cardiac or respiratory ARREST Do not perform Intubation, CPR, defibrillation or ACLS   In the event of cardiac or respiratory ARREST Use medication by any route, position, wound care, and other measures to relive pain and suffering. May use oxygen, suction and manual treatment of airway obstruction as needed for comfort.      04/01/15 2208    Advance Directive Documentation        Most Recent Value   Type of Advance Directive  Living will   Pre-existing out of facility DNR order (yellow form or pink MOST form)     "MOST" Form in Place?        TOTAL TIME TAKING CARE OF THIS PATIENT: 45 minutes.    @[facility] @  on 04/05/2015 at 1:38 PM  Between 7am to 6pm - Pager - [phone]  After 6pm go to [email or web address] - password [email or web address] [place] [facility]  Office  [phone]  CC: Primary care physician; Viviana Simpler, MD

## 2015-04-05 NOTE — Progress Notes (Signed)
Pt transferred to [facility]. Family notified by case Freight forwarder. Report called to [facility] at [facility]. IV and tele removed. Pt gown changed. Transport arrived at 2:40 to take pt to facility. Pt belongings and paperwork given to transporters. Escorted out by [facility] transporters.

## 2015-04-06 ENCOUNTER — Telehealth: Payer: Self-pay | Admitting: *Deleted

## 2015-04-06 DIAGNOSIS — N139 Obstructive and reflux uropathy, unspecified: Secondary | ICD-10-CM | POA: Diagnosis not present

## 2015-04-06 DIAGNOSIS — A419 Sepsis, unspecified organism: Secondary | ICD-10-CM | POA: Diagnosis not present

## 2015-04-06 DIAGNOSIS — N8500 Endometrial hyperplasia, unspecified: Secondary | ICD-10-CM

## 2015-04-06 LAB — CULTURE, BLOOD (ROUTINE X 2)
Culture: NO GROWTH
Culture: NO GROWTH

## 2015-04-06 LAB — URINE CULTURE

## 2015-04-06 NOTE — Telephone Encounter (Signed)
msg sent to Dr. Silvio Pate regarding the need for medical clearance. Pt d/c to [facility] today. Pt will need to return to the clinic on 6/15 for surgery consents. Preop can be arranged for the following week.  Potential surgery date may be 05/09/15

## 2015-04-12 DIAGNOSIS — N950 Postmenopausal bleeding: Secondary | ICD-10-CM | POA: Diagnosis not present

## 2015-04-12 DIAGNOSIS — Z01818 Encounter for other preprocedural examination: Secondary | ICD-10-CM

## 2015-04-20 ENCOUNTER — Ambulatory Visit: Payer: Self-pay | Admitting: Urology

## 2015-04-20 ENCOUNTER — Telehealth: Payer: Self-pay | Admitting: Urology

## 2015-04-20 NOTE — Telephone Encounter (Signed)
Pt's niece Stacey Torres) called wanting to know about scheduling her aunt's ultrasound. She stated that she had spoke to [facility] yesterday and had not heard back from her. Stacey Torres then informed me that she had talked to [place] and the ultrasound could not be scheduled on 04/20/15 or 04/23/15 that she would have it scheduled later this month. Stacey Torres was upset that what she sees as urgent is being placed on a back burner as she wants to have the appt scheduled now. She told me that she was fine waiting til the end of the month for the appts, but she wants to schedule the appts now so that she can arrange transportation for her aunt as well as asking to have the day off so that she can be with her. That waiting to the end of the month to schedule something for the end of the month was difficult for her. She also wanted to let us know that [place] were hard on her aunt as her knees are at a 90 degree angle and can only spread about 4 inches apart. Stacey Torres wishes to have the appointments scheduled ASAP and for someone to call her back, prefers today or Monday. Best contact # [phone] 04/20/15 MAF

## 2015-04-24 ENCOUNTER — Telehealth: Payer: Self-pay | Admitting: *Deleted

## 2015-04-24 NOTE — Telephone Encounter (Signed)
Called [place] to confirm this patient's appointment and coordination of care.  Per Willia Craze, RN, pt's nurse. The POA will come to the appointment tomorrow with the patient. I discussed that surgery will be scheduled for 05/09/15.  [facility] prefers that the facility be notified this week regarding an estimated arrival time in the OR so that transportation can be planned for 05/09/15.  Also discussed pre-admit testing appointments. [facility] prefers that this appointment be scheduled for midmorning and no later than 1pm due to transportation from facility. We will call Deana back when patient has been scheduled for these appointments.

## 2015-04-24 NOTE — Telephone Encounter (Signed)
Left msg for healthcare power of attorney to call [facility] to discuss coordination or patient's care.  Dr. Theora Gianotti has the patient scheduled tomorrow to see the md and consent for surgery.  RN advised POA to attend this appointment.

## 2015-04-25 ENCOUNTER — Inpatient Hospital Stay
Payer: No Typology Code available for payment source | Attending: Obstetrics and Gynecology | Admitting: Obstetrics and Gynecology

## 2015-04-25 ENCOUNTER — Encounter: Payer: Self-pay | Admitting: Obstetrics and Gynecology

## 2015-04-25 ENCOUNTER — Encounter (INDEPENDENT_AMBULATORY_CARE_PROVIDER_SITE_OTHER): Payer: Self-pay

## 2015-04-25 ENCOUNTER — Telehealth: Payer: Self-pay | Admitting: *Deleted

## 2015-04-25 VITALS — BP 130/75 | HR 84 | Temp 98.0°F | Resp 16 | Ht 63.0 in

## 2015-04-25 DIAGNOSIS — N939 Abnormal uterine and vaginal bleeding, unspecified: Secondary | ICD-10-CM | POA: Insufficient documentation

## 2015-04-25 DIAGNOSIS — Z960 Presence of urogenital implants: Principal | ICD-10-CM

## 2015-04-25 DIAGNOSIS — G809 Cerebral palsy, unspecified: Secondary | ICD-10-CM | POA: Diagnosis not present

## 2015-04-25 DIAGNOSIS — I10 Essential (primary) hypertension: Secondary | ICD-10-CM | POA: Insufficient documentation

## 2015-04-25 DIAGNOSIS — N8320 Unspecified ovarian cysts: Secondary | ICD-10-CM | POA: Insufficient documentation

## 2015-04-25 DIAGNOSIS — M2450 Contracture, unspecified joint: Secondary | ICD-10-CM | POA: Diagnosis not present

## 2015-04-25 DIAGNOSIS — Z79899 Other long term (current) drug therapy: Secondary | ICD-10-CM | POA: Insufficient documentation

## 2015-04-25 DIAGNOSIS — M069 Rheumatoid arthritis, unspecified: Secondary | ICD-10-CM | POA: Diagnosis not present

## 2015-04-25 DIAGNOSIS — Z803 Family history of malignant neoplasm of breast: Secondary | ICD-10-CM | POA: Diagnosis not present

## 2015-04-25 DIAGNOSIS — N319 Neuromuscular dysfunction of bladder, unspecified: Secondary | ICD-10-CM | POA: Insufficient documentation

## 2015-04-25 DIAGNOSIS — N1330 Unspecified hydronephrosis: Secondary | ICD-10-CM

## 2015-04-25 DIAGNOSIS — Z978 Presence of other specified devices: Secondary | ICD-10-CM

## 2015-04-25 DIAGNOSIS — R938 Abnormal findings on diagnostic imaging of other specified body structures: Secondary | ICD-10-CM | POA: Insufficient documentation

## 2015-04-25 DIAGNOSIS — R9389 Abnormal findings on diagnostic imaging of other specified body structures: Secondary | ICD-10-CM

## 2015-04-25 DIAGNOSIS — Z7401 Bed confinement status: Secondary | ICD-10-CM | POA: Diagnosis not present

## 2015-04-25 NOTE — Telephone Encounter (Signed)
Stacey Torres accompanied patient to the apt today with Dr. Theora Gianotti.  We are planning a hysteroscopy and D&C for June 29'th.  Stacey Torres expressed concerned about urology apt. States that she has not "heard when this appointment will be scheduled.  She asked that our "office collaborate the patient's care and call Dr. Cherrie Gauze office to determine when foley catheter can safely be removed" Attempted to call urological office during lunch today, but no answer. Will reach out to Dr. Erlene Quan via secure msg to determine when the appointment can be scheduled.  Will Call Darrick Meigs back at [phone]

## 2015-04-25 NOTE — Progress Notes (Addendum)
Gynecologic Oncology Consult Visit   Referring Provider: Prentice Docker, MD  PCP: Venia Carbon, MD [address] [place], [place] [address] [phone]   Chief Concern: endometrial thickness on radiology imaging and history of vaginal bleeding.   Subjective:  Stacey Torres is a 81 y.o. female who is seen in consultation from Dr. Glennon Mac endometrial thickness on radiology imaging and history of vaginal bleeding. She was previously seen by Drs. Earney Navy and OB/GYN who recommended consultation with gynecologic oncology.   The patient is a resident at [facility] for about 3 years. She has recently been admitted to [facility] A PAP that revealed benign endometrial cells.Pelvic ultrasound revealed ES was 28mm which is elevated for her age.Due to the patient's cerebral palsy, pelvic examination and EMBx are very difficult and she was referred to [facility].    03/13/2015 Pelvic Ultrasound  FINDINGS: Uterus  Measurements: 4.6 x 2.5 x 3.3 cm. No fibroids or other mass visualized.  Endometrium  Thickness: 7 mm. The endometrium appears mildly expanded however is poorly visualized on transabdominal images.  Right ovary  Measurements: 2.7 x 1.6 x 2.0 cm. There is a 2.1 x 1.6 x 2.1 cm hypoechoic lesion within the right ovary.  Left ovary  Not visualized.  Other findings: No free fluid in the pelvis. There is a large amount of mixed echogenicity material demonstrated within the bladder lumen measuring 5.6 x 3.3 x 5.5 cm.  IMPRESSION: Limited exam as only transabdominal images were able to performed, markedly limiting evaluation of the endometrium.  The endometrium appears thickened measuring up to 7 mm and mildly expanded with possible associated fluid. Endometrial mass not excluded. In the setting of post-menopausal bleeding, endometrial sampling is indicated to exclude carcinoma. If results are benign, sonohysterogram should be considered for  focal lesion work-up. (Ref: Radiological Reasoning: Algorithmic Workup of Abnormal Vaginal Bleeding with Endovaginal Sonography and Sonohysterography. AJR 2008; 270:W23-76)  Large amount of mixed echogenicity material within the bladder lumen. As the patient was not able to rotate, it is unclear if this is mobile. Findings may represent bladder debris however underlying intraluminal bladder mass is not excluded. Recommend correlation with direct visualization.  There is a 2.1 cm cyst within the right ovary. Recommend follow-up ultrasound in 12 months.     04/03/2015 CT A/P  Reproductive: Uterus and ovaries appear normal.  Other: The bladder is distended with concentric wall thickening at upper limits of normal measuring 3 mm image 70. No free fluid or free air.  IMPRESSION: Progression of previously seen right greater than left hydroureteronephrosis with mild concentric bladder wall thickening. Hypo enhancement of the right kidney is compatible with obstructive uropathy. This could be due to chronic cystitis, infiltrative bladder malignancy, non radiopaque stones, or strictures.  Slight progression of intrahepatic ductal dilatation and common duct dilatation status post cholecystectomy, with tapering to the ampulla.   Initially she opted for no further evaluation but then she changed her mind and would like to proceed with further surgical evaluation. Dr. Silvio Pate has cleared her for surgery.   Problem List: Patient Active Problem List   Diagnosis Date Noted  . Endometrial thickening on ultra sound   . Malnutrition of moderate degree 04/02/2015  . Sepsis due to urinary tract infection 04/01/2015  . Rheumatoid arthritis 04/01/2015  . Essential hypertension 04/01/2015  . Hydronephrosis of right kidney 11/21/2013    Past Medical History: Past Medical History  Diagnosis Date  . Cerebral palsy, hemiplegic     pt is  intelligant  . Neurologic gait dysfunction   .  Spinal stenosis   . Urge urinary incontinence   . Hypertension   . Arthritis   . Neurogenic bladder   . Hydronephrosis, right   . Chronic cystitis   . Gross hematuria   . RA (rheumatoid arthritis)     advanced  . Flexion contracture joint of multiple sites     secondary to advanced ra  . Slurred speech   . Cancer skin ca    Past Surgical History: Past Surgical History  Procedure Laterality Date  . Total knee arthroplasty Right 2005  . Cholecystectomy  1995  . Cystoscopy with retrograde pyelogram, ureteroscopy and stent placement Bilateral 11/21/2013    Procedure: CYSTOSCOPY WITH RIGHT RETROGRADE PYELOGRAM, RIGHT FLEXIBLE and rigid URETEROSCOPY  PLACEMENT;  Surgeon: Bernestine Amass, MD;  Location: [facility];  Service: Urology;  Laterality: Bilateral;  . Cystogram  11/21/2013    Procedure: CYSTOGRAM;  Surgeon: Bernestine Amass, MD;  Location: [facility];  Service: Urology;;    Past Gynecologic History:  See HPI  OB History:  OB History  No data available    Family History: Family History  Problem Relation Age of Onset  . CAD Mother   . Deep vein thrombosis Father   . Breast cancer Sister     Social History: History   Social History  . Marital Status: Single    Spouse Name: N/A  . Number of Children: N/A  . Years of Education: N/A   Occupational History  . Not on file.   Social History Main Topics  . Smoking status: Former Smoker    Quit date: 11/18/1958  . Smokeless tobacco: Never Used  . Alcohol Use: No  . Drug Use: No  . Sexual Activity: Not on file   Other Topics Concern  . Not on file   Social History Narrative    Allergies: No Known Allergies  Current Medications: Current Outpatient Prescriptions  Medication Sig Dispense Refill  . acetaminophen (TYLENOL) 650 MG CR tablet Take 650 mg by mouth 2 (two) times daily as needed for pain.    Marland Kitchen ALPRAZolam (XANAX) 0.25 MG tablet Take 1 tablet (0.25 mg total) by mouth 2  (two) times daily as needed. 10 tablet 0  . Ascorbic Acid (VITAMIN C) 1000 MG tablet Take 1,000 mg by mouth daily.    . benzonatate (TESSALON) 100 MG capsule Take 200 mg by mouth 3 (three) times daily as needed for cough.    . Calcium Carbonate-Vitamin D (CALCIUM 600+D) 600-400 MG-UNIT per tablet Take 1 tablet by mouth every evening.    Marland Kitchen desonide (DESOWEN) 0.05 % cream Apply topically.    Marland Kitchen dextromethorphan 15 MG/5ML syrup Take 10 mLs by mouth 4 (four) times daily as needed for cough.    . diphenhydrAMINE (SOMINEX) 25 MG tablet Take 25 mg by mouth 2 (two) times daily as needed for sleep.    . DULoxetine (CYMBALTA) 60 MG capsule Take 60 mg by mouth every morning.    . Emollient (CERAVE) LOTN Apply topically.    . feeding supplement, ENSURE ENLIVE, (ENSURE ENLIVE) LIQD Take 237 mLs by mouth 2 (two) times daily between meals. 867 mL 12  . folic acid (FOLVITE) 1 MG tablet Take 1 mg by mouth daily.    . hydroxychloroquine (PLAQUENIL) 200 MG tablet Take 200 mg by mouth 2 (two) times daily.    . hydroxypropyl methylcellulose / hypromellose (ISOPTO TEARS / GONIOVISC) 2.5 %  ophthalmic solution Place 2 drops into both eyes 2 (two) times daily as needed for dry eyes.    Marland Kitchen ipratropium (ATROVENT) 0.03 % nasal spray Place 2 sprays into both nostrils 2 (two) times daily.    Marland Kitchen ketoconazole (NIZORAL) 2 % cream Apply 1 application topically 2 (two) times daily.    Marland Kitchen loratadine (CLARITIN) 10 MG tablet Take 10 mg by mouth every morning.     . magnesium hydroxide (MILK OF MAGNESIA) 400 MG/5ML suspension Take 30 mLs by mouth 2 (two) times daily as needed for mild constipation.     . methotrexate (RHEUMATREX) 2.5 MG tablet Take 2.5 mg by mouth once a week. Caution:Chemotherapy. Protect from light.    . metoprolol succinate (TOPROL-XL) 50 MG 24 hr tablet Take 50 mg by mouth 2 (two) times daily. Take with or immediately following a meal.    . ondansetron (ZOFRAN) 4 MG tablet Take 4 mg by mouth every 4 (four) hours as  needed for nausea or vomiting.     . polyethylene glycol (MIRALAX / GLYCOLAX) packet Take 17 g by mouth at bedtime.    . primidone (MYSOLINE) 50 MG tablet Take 50 mg by mouth 2 (two) times daily.     Marland Kitchen senna-docusate (SENOKOT-S) 8.6-50 MG per tablet Take 2 tablets by mouth 2 (two) times daily as needed for mild constipation.    . sennosides-docusate sodium (SENOKOT-S) 8.6-50 MG tablet Take 1 tablet by mouth at bedtime.    Marland Kitchen therapeutic multivitamin-minerals (THERAGRAN-M) tablet Take 1 tablet by mouth every morning.    . traMADol (ULTRAM) 50 MG tablet Take 1 tablet (50 mg total) by mouth every 8 (eight) hours as needed. 15 tablet 0  . traZODone (DESYREL) 50 MG tablet Take 50 mg by mouth at bedtime.    Marland Kitchen zinc oxide 20 % ointment Apply 1 application topically as needed for irritation.    Marland Kitchen ALPRAZolam (XANAX) 0.25 MG tablet Take 1 tablet (0.25 mg total) by mouth 3 (three) times daily. (Patient not taking: Reported on 04/25/2015) 30 tablet 0  . gabapentin (NEURONTIN) 100 MG capsule Take 100 mg by mouth 2 (two) times daily.     No current facility-administered medications for this visit.    Review of Systems General: weakness and decreased appetite  HEENT: no complaints  Lungs: no complaints  Cardiac: no complaints  GI: constipation  GU: bladder function issues; Foley catheter in place. Vaginal discharge  Musculoskeletal: no complaints  Extremities: bilateral leg swelling  Skin: no complaints  Neuro: no complaints  Endocrine: no complaints  Psych: no complaints       Objective:  Physical Examination:  BP 130/75 mmHg  Pulse 84  Temp(Src) 98 F (36.7 C) (Tympanic)  Resp 16  Ht 5\' 3"  (1.6 m)  Wt    ECOG Performance Status: 3 - Symptomatic, >50% confined to bed  General appearance: alert, cooperative and appears stated age  In wheelchair with Foley catheter in place. She is accompanied by her niece who wil also be her 27.  HEENT:PERRLA and sclera clear, anicteric Lymph node  survey: non-palpable, axillary, supraclavicular Cardiovascular: regular rate and rhythm Respiratory: normal air entry on the right, decreased on the left, lungs clear to auscultation Abdomen: soft, non-tender, without masses or organomegaly and no hernias Extremities: extremities normal, atraumatic, no cyanosis or edema   Pelvic: Deferred to OR  Lab Review Lab Results  Component Value Date   WBC 5.1 04/05/2015   HGB 8.7* 04/05/2015   HCT 27.5* 04/05/2015  MCV 100.0 04/05/2015   PLT 289 04/05/2015   04/04/2015 Urine culture: multiple species noted    Chemistry      Component Value Date/Time   NA 137 04/03/2015 0637   K 3.5 04/03/2015 0637   CL 105 04/03/2015 0637   CO2 27 04/03/2015 0637   BUN 9 04/03/2015 0637   CREATININE 0.39* 04/03/2015 0637   CREATININE 0.62 05/02/2013 1004      Component Value Date/Time   CALCIUM 8.1* 04/03/2015 0637   ALKPHOS 65 04/01/2015 2020   AST 17 04/01/2015 2020   ALT 7* 04/01/2015 2020   BILITOT 0.2* 04/01/2015 2020     MRSA negative 04/02/2015  Radiologic Imaging: Ultrasound reviewed    Assessment:  Stacey Torres is a 81 y.o. female diagnosed with thickened endometrial stripe and vaginal bleeding. Right ovarian simple cyst, most likely benign.  Medical co-morbidities complicating care: cerebral palsy, neurologic gain dysfunction, frailty, rheumatoid arthritis, multiple contractures, bedbound.    Plan:   Problem List Items Addressed This Visit    None    Visit Diagnoses    Thickened endometrium    -  Primary    Vaginal bleeding           We discussed options for management. At her consult visit she chose no further evaluation, but she has changed her mind and would like to proceed with surgery for diagnosis. Plan is hysteroscopy, D&C, possible polyp removal, and Myosure. She has a small uterus and is at increased risk for perforation. I will request for ultrasound as stand-by.   I discussed medications with Dr. Silvio Pate. He  did not think Plaquenil needed to be held, but will arrange for methotrexate to be held for surgery.    The risks of surgery were discussed in detail and she understands these to include infection; injury to adjacent organs such as bowel, bladder, blood vessels; uterine perforation; bleeding which may require blood transfusion; anesthesia risk; thromboembolic events; possible death; unforeseen complications; possible need for laparotomy/laparoscopy; medical complications such as heart attack, stroke, pleural effusion and pneumonia.  The patient will receive DVT and antibiotic prophylaxis if indicated.  She voiced a clear understanding.  She had the opportunity to ask questions and written informed consent was obtained today.  She will see the Preop team.   Suggested return to clinic after surgery.   The patient's diagnosis, an outline of the further diagnostic and laboratory studies which will be required, the recommendation, and alternatives were discussed.  All questions were answered to the patient's satisfaction.   Gillis Ends, MD  ADDENDUM: I spoke with Dr. Erlene Quan. Dr. Erlene Quan has asked her team to order the ultrasound and set up the follow up appointment. If Stacey Torres still needs the catheter I will change that out at the time of her surgery. Per Dr. Erlene Quan, the catheters are typically changed once a month. Renita Papa will let Stacey Torres's niece know the plan.  Gillis Ends, MD    CC:  Venia Carbon, MD [address] [place], [place] [address] [phone]

## 2015-04-26 NOTE — Addendum Note (Signed)
Addended by: Hollice Espy on: 04/26/2015 02:36 PM   Modules accepted: Orders

## 2015-04-26 NOTE — H&P (Signed)
Gynecologic Oncology Consult Visit   Referring Provider: Prentice Docker, MD  PCP: Venia Carbon, MD [address] [place], [place] [address] [phone]   Chief Concern: endometrial thickness on radiology imaging and history of vaginal bleeding.   Subjective:  Everlena Mackley is a 81 y.o. female who is seen in consultation from Dr. Glennon Mac endometrial thickness on radiology imaging and history of vaginal bleeding. She was previously seen by Drs. Earney Navy and OB/GYN who recommended consultation with gynecologic oncology.   The patient is a resident at [facility] for about 3 years. She has recently been admitted to [facility]. A PAP that revealed benign endometrial cells.Pelvic ultrasound revealed ES was 21mm which is elevated for her age.Due to the patient's cerebral palsy, pelvic examination and EMBx are very difficult and she was referred to [facility].    03/13/2015 Pelvic Ultrasound  FINDINGS: Uterus  Measurements: 4.6 x 2.5 x 3.3 cm. No fibroids or other mass visualized.  Endometrium  Thickness: 7 mm. The endometrium appears mildly expanded however is poorly visualized on transabdominal images.  Right ovary  Measurements: 2.7 x 1.6 x 2.0 cm. There is a 2.1 x 1.6 x 2.1 cm hypoechoic lesion within the right ovary.  Left ovary  Not visualized.  Other findings: No free fluid in the pelvis. There is a large amount of mixed echogenicity material demonstrated within the bladder lumen measuring 5.6 x 3.3 x 5.5 cm.  IMPRESSION: Limited exam as only transabdominal images were able to performed, markedly limiting evaluation of the endometrium.  The endometrium appears thickened measuring up to 7 mm and mildly expanded with possible associated fluid. Endometrial mass not excluded. In the setting of post-menopausal bleeding, endometrial sampling is indicated to exclude carcinoma. If results are benign, sonohysterogram should be considered  for focal lesion work-up. (Ref: Radiological Reasoning: Algorithmic Workup of Abnormal Vaginal Bleeding with Endovaginal Sonography and Sonohysterography. AJR 2008; 765:Y65-03)  Large amount of mixed echogenicity material within the bladder lumen. As the patient was not able to rotate, it is unclear if this is mobile. Findings may represent bladder debris however underlying intraluminal bladder mass is not excluded. Recommend correlation with direct visualization.  There is a 2.1 cm cyst within the right ovary. Recommend follow-up ultrasound in 12 months.     04/03/2015 CT A/P  Reproductive: Uterus and ovaries appear normal.  Other: The bladder is distended with concentric wall thickening at upper limits of normal measuring 3 mm image 70. No free fluid or free air.  IMPRESSION: Progression of previously seen right greater than left hydroureteronephrosis with mild concentric bladder wall thickening. Hypo enhancement of the right kidney is compatible with obstructive uropathy. This could be due to chronic cystitis, infiltrative bladder malignancy, non radiopaque stones, or strictures.  Slight progression of intrahepatic ductal dilatation and common duct dilatation status post cholecystectomy, with tapering to the ampulla.   Initially she opted for no further evaluation but then she changed her mind and would like to proceed with further surgical evaluation. Dr. Silvio Pate has cleared her for surgery.   Problem List: Patient Active Problem List   Diagnosis Date Noted  . Endometrial thickening on ultra sound   . Malnutrition of moderate degree 04/02/2015  . Sepsis due to urinary tract infection 04/01/2015  . Rheumatoid arthritis 04/01/2015  . Essential hypertension 04/01/2015  . Hydronephrosis of right kidney 11/21/2013    Past Medical History: Past Medical History  Diagnosis Date  . Cerebral palsy, hemiplegic     pt is  intelligant  .  Neurologic gait dysfunction   . Spinal stenosis   . Urge urinary incontinence   . Hypertension   . Arthritis   . Neurogenic bladder   . Hydronephrosis, right   . Chronic cystitis   . Gross hematuria   . RA (rheumatoid arthritis)     advanced  . Flexion contracture joint of multiple sites     secondary to advanced ra  . Slurred speech   . Cancer skin ca    Past Surgical History: Past Surgical History  Procedure Laterality Date  . Total knee arthroplasty Right 2005  . Cholecystectomy  1995  . Cystoscopy with retrograde pyelogram, ureteroscopy and stent placement Bilateral 11/21/2013    Procedure: CYSTOSCOPY WITH RIGHT RETROGRADE PYELOGRAM, RIGHT FLEXIBLE and rigid URETEROSCOPY PLACEMENT; Surgeon: Bernestine Amass, MD; Location: [facility]; Service: Urology; Laterality: Bilateral;  . Cystogram  11/21/2013    Procedure: CYSTOGRAM; Surgeon: Bernestine Amass, MD; Location: [facility]; Service: Urology;;    Past Gynecologic History:  See HPI  OB History:  OB History  No data available    Family History: Family History  Problem Relation Age of Onset  . CAD Mother   . Deep vein thrombosis Father   . Breast cancer Sister     Social History: History   Social History  . Marital Status: Single    Spouse Name: N/A  . Number of Children: N/A  . Years of Education: N/A   Occupational History  . Not on file.   Social History Main Topics  . Smoking status: Former Smoker    Quit date: 11/18/1958  . Smokeless tobacco: Never Used  . Alcohol Use: No  . Drug Use: No  . Sexual Activity: Not on file   Other Topics Concern  . Not on file   Social History Narrative    Allergies: No Known Allergies  Current Medications: Current Outpatient Prescriptions  Medication Sig Dispense Refill  .  acetaminophen (TYLENOL) 650 MG CR tablet Take 650 mg by mouth 2 (two) times daily as needed for pain.    Marland Kitchen ALPRAZolam (XANAX) 0.25 MG tablet Take 1 tablet (0.25 mg total) by mouth 2 (two) times daily as needed. 10 tablet 0  . Ascorbic Acid (VITAMIN C) 1000 MG tablet Take 1,000 mg by mouth daily.    . benzonatate (TESSALON) 100 MG capsule Take 200 mg by mouth 3 (three) times daily as needed for cough.    . Calcium Carbonate-Vitamin D (CALCIUM 600+D) 600-400 MG-UNIT per tablet Take 1 tablet by mouth every evening.    Marland Kitchen desonide (DESOWEN) 0.05 % cream Apply topically.    Marland Kitchen dextromethorphan 15 MG/5ML syrup Take 10 mLs by mouth 4 (four) times daily as needed for cough.    . diphenhydrAMINE (SOMINEX) 25 MG tablet Take 25 mg by mouth 2 (two) times daily as needed for sleep.    . DULoxetine (CYMBALTA) 60 MG capsule Take 60 mg by mouth every morning.    . Emollient (CERAVE) LOTN Apply topically.    . feeding supplement, ENSURE ENLIVE, (ENSURE ENLIVE) LIQD Take 237 mLs by mouth 2 (two) times daily between meals. 982 mL 12  . folic acid (FOLVITE) 1 MG tablet Take 1 mg by mouth daily.    . hydroxychloroquine (PLAQUENIL) 200 MG tablet Take 200 mg by mouth 2 (two) times daily.    . hydroxypropyl methylcellulose / hypromellose (ISOPTO TEARS / GONIOVISC) 2.5 % ophthalmic solution Place 2 drops into both eyes  2 (two) times daily as needed for dry eyes.    Marland Kitchen ipratropium (ATROVENT) 0.03 % nasal spray Place 2 sprays into both nostrils 2 (two) times daily.    Marland Kitchen ketoconazole (NIZORAL) 2 % cream Apply 1 application topically 2 (two) times daily.    Marland Kitchen loratadine (CLARITIN) 10 MG tablet Take 10 mg by mouth every morning.     . magnesium hydroxide (MILK OF MAGNESIA) 400 MG/5ML suspension Take 30 mLs by mouth 2 (two) times daily as needed for mild constipation.     . methotrexate (RHEUMATREX) 2.5 MG tablet Take 2.5 mg by mouth once a  week. Caution:Chemotherapy. Protect from light.    . metoprolol succinate (TOPROL-XL) 50 MG 24 hr tablet Take 50 mg by mouth 2 (two) times daily. Take with or immediately following a meal.    . ondansetron (ZOFRAN) 4 MG tablet Take 4 mg by mouth every 4 (four) hours as needed for nausea or vomiting.     . polyethylene glycol (MIRALAX / GLYCOLAX) packet Take 17 g by mouth at bedtime.    . primidone (MYSOLINE) 50 MG tablet Take 50 mg by mouth 2 (two) times daily.     Marland Kitchen senna-docusate (SENOKOT-S) 8.6-50 MG per tablet Take 2 tablets by mouth 2 (two) times daily as needed for mild constipation.    . sennosides-docusate sodium (SENOKOT-S) 8.6-50 MG tablet Take 1 tablet by mouth at bedtime.    Marland Kitchen therapeutic multivitamin-minerals (THERAGRAN-M) tablet Take 1 tablet by mouth every morning.    . traMADol (ULTRAM) 50 MG tablet Take 1 tablet (50 mg total) by mouth every 8 (eight) hours as needed. 15 tablet 0  . traZODone (DESYREL) 50 MG tablet Take 50 mg by mouth at bedtime.    Marland Kitchen zinc oxide 20 % ointment Apply 1 application topically as needed for irritation.    Marland Kitchen ALPRAZolam (XANAX) 0.25 MG tablet Take 1 tablet (0.25 mg total) by mouth 3 (three) times daily. (Patient not taking: Reported on 04/25/2015) 30 tablet 0  . gabapentin (NEURONTIN) 100 MG capsule Take 100 mg by mouth 2 (two) times daily.     No current facility-administered medications for this visit.    Review of Systems General: weakness and decreased appetite  HEENT: no complaints  Lungs: no complaints  Cardiac: no complaints  GI: constipation  GU: bladder function issues; Foley catheter in place. Vaginal discharge  Musculoskeletal: no complaints  Extremities: bilateral leg swelling  Skin: no complaints  Neuro: no complaints  Endocrine: no complaints  Psych: no complaints       Objective:  Physical Examination:  BP 130/75 mmHg  Pulse 84  Temp(Src) 98  F (36.7 C) (Tympanic)  Resp 16  Ht 5\' 3"  (1.6 m)  Wt   ECOG Performance Status: 3 - Symptomatic, >50% confined to bed  General appearance: alert, cooperative and appears stated age In wheelchair with Foley catheter in place. She is accompanied by her niece who wil also be her 17.  HEENT:PERRLA and sclera clear, anicteric Lymph node survey: non-palpable, axillary, supraclavicular Cardiovascular: regular rate and rhythm Respiratory: normal air entry on the right, decreased on the left, lungs clear to auscultation Abdomen: soft, non-tender, without masses or organomegaly and no hernias Extremities: extremities normal, atraumatic, no cyanosis or edema   Pelvic: Deferred to OR  Lab Review  Recent Labs    Lab Results  Component Value Date   WBC 5.1 04/05/2015   HGB 8.7* 04/05/2015   HCT 27.5* 04/05/2015   MCV 100.0 04/05/2015  PLT 289 04/05/2015     04/04/2015 Urine culture: multiple species noted   Chemistry    Labs (Brief)       Component Value Date/Time   NA 137 04/03/2015 0637   K 3.5 04/03/2015 0637   CL 105 04/03/2015 0637   CO2 27 04/03/2015 0637   BUN 9 04/03/2015 0637   CREATININE 0.39* 04/03/2015 0637   CREATININE 0.62 05/02/2013 1004      Labs (Brief)       Component Value Date/Time   CALCIUM 8.1* 04/03/2015 0637   ALKPHOS 65 04/01/2015 2020   AST 17 04/01/2015 2020   ALT 7* 04/01/2015 2020   BILITOT 0.2* 04/01/2015 2020       MRSA negative 04/02/2015  Radiologic Imaging: Ultrasound reviewed  Assessment:  Lonie Rummell is a 81 y.o. female diagnosed with thickened endometrial stripe and vaginal bleeding. Right ovarian simple cyst, most likely benign. Medical co-morbidities complicating care: cerebral palsy, neurologic gain dysfunction, frailty, rheumatoid arthritis, multiple contractures, bedbound.    Plan:   Problem List Items Addressed  This Visit    None    Visit Diagnoses    Thickened endometrium - Primary    Vaginal bleeding        We discussed options for management. At her consult visit she chose no further evaluation, but she has changed her mind and would like to proceed with surgery for diagnosis. Plan is hysteroscopy, D&C, possible polyp removal, and Myosure. She has a small uterus and is at increased risk for perforation. I will request for ultrasound as stand-by.   I discussed medications with Dr. Silvio Pate. He did not think Plaquenil needed to be held, but will arrange for methotrexate to be held for surgery.    The risks of surgery were discussed in detail and she understands these to include infection; injury to adjacent organs such as bowel, bladder, blood vessels; uterine perforation; bleeding which may require blood transfusion; anesthesia risk; thromboembolic events; possible death; unforeseen complications; possible need for laparotomy/laparoscopy; medical complications such as heart attack, stroke, pleural effusion and pneumonia. The patient will receive DVT and antibiotic prophylaxis if indicated. She voiced a clear understanding. She had the opportunity to ask questions and written informed consent was obtained today.  She will see the Preop team.   Suggested return to clinic after surgery.   The patient's diagnosis, an outline of the further diagnostic and laboratory studies which will be required, the recommendation, and alternatives were discussed. All questions were answered to the patient's satisfaction.   Gillis Ends, MD    CC:  Venia Carbon, MD [address] [place], [place] [address] [phone]

## 2015-04-26 NOTE — Telephone Encounter (Signed)
Lattie Haw or Sharyn Lull,  This patient was supposed to have a follow up in the office 4 weeks form her hospital discharge with renal ultrasound prior.  It looks like this apt never got made.  Can you please arrange?  Order for RUS placed.    Hollice Espy, MD

## 2015-04-30 ENCOUNTER — Encounter
Admission: RE | Admit: 2015-04-30 | Discharge: 2015-04-30 | Disposition: A | Discharge status: Home / Self Care | Payer: Medicare Other | Source: Ambulatory Visit | Attending: Obstetrics and Gynecology | Admitting: Obstetrics and Gynecology

## 2015-04-30 DIAGNOSIS — N1330 Unspecified hydronephrosis: Secondary | ICD-10-CM | POA: Diagnosis not present

## 2015-04-30 DIAGNOSIS — N3289 Other specified disorders of bladder: Secondary | ICD-10-CM | POA: Diagnosis not present

## 2015-04-30 DIAGNOSIS — N261 Atrophy of kidney (terminal): Secondary | ICD-10-CM | POA: Diagnosis not present

## 2015-04-30 HISTORY — DX: Cough: R05

## 2015-04-30 HISTORY — DX: Sepsis, unspecified organism: A41.9

## 2015-04-30 HISTORY — DX: Unspecified cataract: H26.9

## 2015-04-30 HISTORY — DX: Urinary tract infection, site not specified: N39.0

## 2015-04-30 HISTORY — DX: Other seasonal allergic rhinitis: J30.2

## 2015-04-30 HISTORY — DX: Cough, unspecified: R05.9

## 2015-04-30 HISTORY — DX: Localized edema: R60.0

## 2015-04-30 LAB — TYPE AND SCREEN
ABO/RH(D): O POS
ANTIBODY SCREEN: NEGATIVE

## 2015-04-30 LAB — ABO/RH: ABO/RH(D): O POS

## 2015-04-30 NOTE — Patient Instructions (Signed)
  Your procedure is scheduled on: 05/09/15 Wed Report to Day Surgery. To find out your arrival time please call [phone] between 1PM - 3PM on Tues 05/08/15.  Remember: Instructions that are not followed completely may result in serious medical risk, up to and including death, or upon the discretion of your surgeon and anesthesiologist your surgery may need to be rescheduled.    __x__ 1. Do not eat food or drink liquids after midnight. No gum chewing or hard candies.     ____ 2. No Alcohol for 24 hours before or after surgery.   ____ 3. Bring all medications with you on the day of surgery if instructed.    __x__ 4. Notify your doctor if there is any change in your medical condition     (cold, fever, infections).     Do not wear jewelry, make-up, hairpins, clips or nail polish.  Do not wear lotions, powders, or perfumes. You may wear deodorant.  Do not shave 48 hours prior to surgery. Men may shave face and neck.  Do not bring valuables to the hospital.    [facility] is not responsible for any belongings or valuables.               Contacts, dentures or bridgework may not be worn into surgery.  Leave your suitcase in the car. After surgery it may be brought to your room.  For patients admitted to the hospital, discharge time is determined by your                treatment team.   Patients discharged the day of surgery will not be allowed to drive home.   Please read over the following fact sheets that you were given:      _x___ Take these medicines the morning of surgery with A SIP OF WATER:    1. ALPRAZolam (XANAX) 0.25 MG tablet  2. DULoxetine (CYMBALTA) 60 MG capsule  3. gabapentin (NEURONTIN) 100 MG capsule  4.metoprolol succinate (TOPROL-XL) 50 MG 24 hr tablet  5.traMADol (ULTRAM) 50 MG tablet  6.  ____ Fleet Enema (as directed)   ____ Use CHG Soap as directed  ____ Use inhalers on the day of surgery  ____ Stop metformin 2 days prior to surgery    ____ Take 1/2  of usual insulin dose the night before surgery and none on the morning of surgery.   ____ Stop Coumadin/Plavix/aspirin on   __x__ Stop Anti-inflammatories on stopped plaquenil on Friday   ____ Stop supplements until after surgery.    ____ Bring C-Pap to the hospital.

## 2015-05-01 ENCOUNTER — Telehealth: Payer: Self-pay | Admitting: *Deleted

## 2015-05-01 NOTE — Telephone Encounter (Signed)
Left voice mail msg for Stacey Torres regarding patient urology apt needs per md instructions.

## 2015-05-01 NOTE — Telephone Encounter (Signed)
-----   Message from [place], MD sent at 05/01/2015  7:33 AM EDT ----- Nira Conn,  Can you please let Ms. Blandon's niece know that I spoke with Dr. Erlene Quan. Dr. Erlene Quan has asked her team to order the ultrasound and set up the follow up appointment. If she still needs the catheter I will change that out at the time of her surgery. Per Dr. Erlene Quan, the catheters are typically changed once a month.  Thank you Sincerely [place]

## 2015-05-03 ENCOUNTER — Ambulatory Visit
Admission: RE | Admit: 2015-05-03 | Discharge: 2015-05-03 | Disposition: A | Discharge status: Home / Self Care | Payer: Medicare Other | Source: Ambulatory Visit | Attending: Urology | Admitting: Urology

## 2015-05-03 DIAGNOSIS — N1330 Unspecified hydronephrosis: Secondary | ICD-10-CM | POA: Insufficient documentation

## 2015-05-03 DIAGNOSIS — N261 Atrophy of kidney (terminal): Secondary | ICD-10-CM | POA: Insufficient documentation

## 2015-05-03 DIAGNOSIS — N3289 Other specified disorders of bladder: Secondary | ICD-10-CM | POA: Insufficient documentation

## 2015-05-09 ENCOUNTER — Ambulatory Visit: Payer: Medicare Other | Admitting: *Deleted

## 2015-05-09 ENCOUNTER — Ambulatory Visit
Admission: RE | Admit: 2015-05-09 | Discharge: 2015-05-09 | Disposition: A | Discharge status: Skilled Nursing Facility | Payer: Medicare Other | Source: Ambulatory Visit | Attending: Obstetrics and Gynecology | Admitting: Obstetrics and Gynecology

## 2015-05-09 ENCOUNTER — Ambulatory Visit: Payer: Medicare Other

## 2015-05-09 ENCOUNTER — Encounter
Admission: RE | Disposition: A | Discharge status: Skilled Nursing Facility | Payer: Self-pay | Source: Ambulatory Visit | Attending: Obstetrics and Gynecology

## 2015-05-09 ENCOUNTER — Encounter: Payer: Self-pay | Admitting: *Deleted

## 2015-05-09 DIAGNOSIS — Z87891 Personal history of nicotine dependence: Secondary | ICD-10-CM | POA: Diagnosis not present

## 2015-05-09 DIAGNOSIS — E440 Moderate protein-calorie malnutrition: Secondary | ICD-10-CM | POA: Diagnosis not present

## 2015-05-09 DIAGNOSIS — N950 Postmenopausal bleeding: Secondary | ICD-10-CM | POA: Insufficient documentation

## 2015-05-09 DIAGNOSIS — G809 Cerebral palsy, unspecified: Secondary | ICD-10-CM | POA: Insufficient documentation

## 2015-05-09 DIAGNOSIS — Z79899 Other long term (current) drug therapy: Secondary | ICD-10-CM | POA: Insufficient documentation

## 2015-05-09 DIAGNOSIS — R938 Abnormal findings on diagnostic imaging of other specified body structures: Secondary | ICD-10-CM | POA: Diagnosis not present

## 2015-05-09 DIAGNOSIS — N3941 Urge incontinence: Secondary | ICD-10-CM | POA: Insufficient documentation

## 2015-05-09 DIAGNOSIS — Z96651 Presence of right artificial knee joint: Secondary | ICD-10-CM | POA: Insufficient documentation

## 2015-05-09 DIAGNOSIS — N3020 Other chronic cystitis without hematuria: Secondary | ICD-10-CM | POA: Insufficient documentation

## 2015-05-09 DIAGNOSIS — M069 Rheumatoid arthritis, unspecified: Secondary | ICD-10-CM | POA: Insufficient documentation

## 2015-05-09 DIAGNOSIS — Z8249 Family history of ischemic heart disease and other diseases of the circulatory system: Secondary | ICD-10-CM | POA: Insufficient documentation

## 2015-05-09 DIAGNOSIS — N858 Other specified noninflammatory disorders of uterus: Secondary | ICD-10-CM | POA: Insufficient documentation

## 2015-05-09 DIAGNOSIS — N939 Abnormal uterine and vaginal bleeding, unspecified: Secondary | ICD-10-CM | POA: Insufficient documentation

## 2015-05-09 DIAGNOSIS — Z85828 Personal history of other malignant neoplasm of skin: Secondary | ICD-10-CM | POA: Insufficient documentation

## 2015-05-09 DIAGNOSIS — N319 Neuromuscular dysfunction of bladder, unspecified: Secondary | ICD-10-CM | POA: Diagnosis not present

## 2015-05-09 DIAGNOSIS — M4800 Spinal stenosis, site unspecified: Secondary | ICD-10-CM | POA: Insufficient documentation

## 2015-05-09 DIAGNOSIS — Z9049 Acquired absence of other specified parts of digestive tract: Secondary | ICD-10-CM | POA: Insufficient documentation

## 2015-05-09 DIAGNOSIS — Z419 Encounter for procedure for purposes other than remedying health state, unspecified: Secondary | ICD-10-CM

## 2015-05-09 HISTORY — PX: HYSTEROSCOPY WITH D & C: SHX1775

## 2015-05-09 SURGERY — DILATATION AND CURETTAGE /HYSTEROSCOPY
Anesthesia: General | Wound class: Clean Contaminated

## 2015-05-09 MED ORDER — FAMOTIDINE 20 MG PO TABS
ORAL_TABLET | ORAL | Status: AC
  Administered 2015-05-09: 20 mg via ORAL
  Filled 2015-05-09: qty 1

## 2015-05-09 MED ORDER — LIDOCAINE HCL (CARDIAC) 20 MG/ML IV SOLN
INTRAVENOUS | Status: DC | PRN
  Administered 2015-05-09: 100 mg via INTRAVENOUS

## 2015-05-09 MED ORDER — PROPOFOL 10 MG/ML IV BOLUS
INTRAVENOUS | Status: DC | PRN
  Administered 2015-05-09: 100 mg via INTRAVENOUS

## 2015-05-09 MED ORDER — ROCURONIUM BROMIDE 100 MG/10ML IV SOLN
INTRAVENOUS | Status: DC | PRN
  Administered 2015-05-09: 15 mg via INTRAVENOUS
  Administered 2015-05-09: 5 mg via INTRAVENOUS

## 2015-05-09 MED ORDER — LACTATED RINGERS IV SOLN
INTRAVENOUS | Status: DC
  Administered 2015-05-09: 11:00:00 via INTRAVENOUS

## 2015-05-09 MED ORDER — ONDANSETRON HCL 4 MG/2ML IJ SOLN: 4.0000 mg | Freq: Once | INTRAMUSCULAR | Status: AC | PRN

## 2015-05-09 MED ORDER — FENTANYL CITRATE (PF) 100 MCG/2ML IJ SOLN
INTRAMUSCULAR | Status: DC | PRN
  Administered 2015-05-09: 50 ug via INTRAVENOUS

## 2015-05-09 MED ORDER — ONDANSETRON HCL 4 MG/2ML IJ SOLN
INTRAMUSCULAR | Status: DC | PRN
  Administered 2015-05-09: 4 mg via INTRAVENOUS

## 2015-05-09 MED ORDER — PHENYLEPHRINE HCL 10 MG/ML IJ SOLN
INTRAMUSCULAR | Status: DC | PRN
  Administered 2015-05-09: 200 ug via INTRAVENOUS
  Administered 2015-05-09 (×4): 100 ug via INTRAVENOUS

## 2015-05-09 MED ORDER — FAMOTIDINE 20 MG PO TABS
20.0000 mg | ORAL_TABLET | Freq: Once | ORAL | Status: AC
  Administered 2015-05-09: 20 mg via ORAL

## 2015-05-09 MED ORDER — FENTANYL CITRATE (PF) 100 MCG/2ML IJ SOLN: 25.0000 ug | INTRAMUSCULAR | Status: DC | PRN

## 2015-05-09 MED ORDER — DOXYCYCLINE HYCLATE 100 MG IV SOLR
100.0000 mg | Freq: Once | INTRAVENOUS | Status: AC
  Administered 2015-05-09: 100 mg via INTRAVENOUS
  Filled 2015-05-09: qty 100

## 2015-05-09 SURGICAL SUPPLY — 23 items
CANISTER SUCT 3000ML (MISCELLANEOUS) ×3 IMPLANT
CORD URO TURP 10FT (MISCELLANEOUS) ×3 IMPLANT
DRAPE UNDER BUTTOCK W/FLU (DRAPES) ×3 IMPLANT
DRSG TELFA 3X8 NADH (GAUZE/BANDAGES/DRESSINGS) ×3 IMPLANT
ELECT LOOP MED HF 24F 12D (CUTTING LOOP) ×3 IMPLANT
ELECT RESECT POWERBALL 24F (MISCELLANEOUS) ×3 IMPLANT
GLOVE BIO SURGEON STRL SZ 6.5 (GLOVE) ×2 IMPLANT
GLOVE BIO SURGEONS STRL SZ 6.5 (GLOVE) ×1
GLYCINE 1.5% IRRIG UROMATIC (IV SOLUTION) ×3 IMPLANT
GOWN STRL REUS W/ TWL LRG LVL3 (GOWN DISPOSABLE) ×1 IMPLANT
GOWN STRL REUS W/TWL LRG LVL3 (GOWN DISPOSABLE) ×2
IV LACTATED RINGERS 1000ML (IV SOLUTION) ×3 IMPLANT
NS IRRIG 500ML POUR BTL (IV SOLUTION) ×3 IMPLANT
PACK DNC HYST (MISCELLANEOUS) ×3 IMPLANT
PAD GROUND ADULT SPLIT (MISCELLANEOUS) ×3 IMPLANT
PAD OB MATERNITY 4.3X12.25 (PERSONAL CARE ITEMS) ×3 IMPLANT
PAD PREP 24X41 OB/GYN DISP (PERSONAL CARE ITEMS) ×3 IMPLANT
SET CYSTO W/LG BORE CLAMP LF (SET/KITS/TRAYS/PACK) ×3 IMPLANT
SPONGE XRAY 4X4 16PLY STRL (MISCELLANEOUS) ×3 IMPLANT
STRAP SAFETY BODY (MISCELLANEOUS) ×3 IMPLANT
TOWEL OR 17X26 4PK STRL BLUE (TOWEL DISPOSABLE) ×3 IMPLANT
TUBING CONNECTING 10 (TUBING) ×2 IMPLANT
TUBING CONNECTING 10' (TUBING) ×1

## 2015-05-09 NOTE — Interval H&P Note (Signed)
History and Physical Interval Note:  05/09/2015 10:32 AM  Stacey Torres  has presented today for surgery, with the diagnosis of THICKENED ENDOMETRIAL STRIPE, VAGINAL BLEEDING ON RADIOLOGY IMAGING  The various methods of treatment have been discussed with the patient and family. Her EKG was abnormal with left axis deviation and nonspecific T-wave changes. She has been cleared for surgery by her medical team. Per Urology her Foley does not need to be reinserted.   After consideration of risks, benefits and other options for treatment, the patient has consented to  Procedure(s): DILATATION AND CURETTAGE /HYSTEROSCOPY (N/A) as a surgical intervention .  The patient's history has been reviewed, patient examined, no change in status, stable for surgery.  I have reviewed the patient's chart and labs.  The patient had no questions.      SECORD,ANGELES ALVAREZ

## 2015-05-09 NOTE — Anesthesia Procedure Notes (Signed)
Procedure Name: LMA Insertion Date/Time: 05/09/2015 10:15 AM Performed by: Neale Marzette Pre-anesthesia Checklist: Patient identified, Emergency Drugs available, Patient being monitored and Suction available Patient Re-evaluated:Patient Re-evaluated prior to inductionOxygen Delivery Method: Circle system utilized Preoxygenation: Pre-oxygenation with 100% oxygen Intubation Type: IV induction Ventilation: Mask ventilation without difficulty LMA: LMA inserted LMA Size: 3.5 Dental Injury: Teeth and Oropharynx as per pre-operative assessment  Difficulty Due To: Difficulty was unanticipated

## 2015-05-09 NOTE — Op Note (Signed)
   Operative Note  05/09/2015 12:09 PM  PRE-OP DIAGNOSIS: THICKENED ENDOMETRIAL STRIPE ON RADIOLOGY IMAGING, , VAGINAL BLEEDING     POST-OP DIAGNOSIS: Possible endometrial polyp  SURGEON: Surgeon(s) and Role:    * Esmirna Ravan Gaetana Michaelis, MD - Primary  ANESTHESIA: General   PROCEDURE: Procedure(s): EXAM UNDER ANESTHESIA, DILATATION AND CURETTAGE /HYSTEROSCOPY, Myosure FOR TISSUE REMOVAL AND POLYPECTOMY   ESTIMATED BLOOD LOSS: Minimal  DRAINS: NONE    TOTAL IV FLUIDS: 500 CC LR   SPECIMENS: ECC, EMC, AND MYOSURE TISSUE   COMPLICATIONS: NONE   DISPOSITION: PACU - hemodynamically stable. Anticipate discharge from the PACU.  CONDITION: stable  INDICATIONS: Postmenopausal female with vaginal bleeding and thickened endometrial stripe  FINDINGS: Exam under anesthesia revealed a small 5-6 wk size uterus without palpable adnexal masses.  Uterus sounded to 5 cm. Hysteroscopic findings revealed possible polyp on the posterior wall. The left ostia was completely seen. The right ostia could not be completely visualized due to the positioning issues. No other lesions.     PROCEDURE IN DETAIL: After informed consent was obtained, the patient was taken to the operating room where anesthesia was obtained without difficulty. The patient could not be positioned in the dorsal lithotomy position due to severe contractures. Her legs had to be held by two assistants. The contractures limited position as well as optimal optimization. Time-out was performed.   The patient was examined under anesthesia, and the above findings were noted.The patient's Foley catheter was removed.  She was prepped and draped in sterile fashion.   A speculum and deaver were placed inside the patient's vagina. The anterior lip of the cervix was visualized and grasped with a single-toothed tenaculum.  The uterine cavity sounded to 5 cm. An ECC was performed. The cervix was then progressively dilated up to a size 24-French Pratt  dilator. The diagnostic hysteroscope was introduced into the uterine cavity and the above findings were noted. The hysteroscope was removed and the uterine cavity was curreted until a gritty texture was noted. The diagnostic hysteroscope was re-introduced into the uterine cavity and remnants of the posterior lesion were still seen. Therefore the Myosure was used to resect all this tissue.  Excellent hemostasis was noted. The speculum and all other instruments were removed from the patient's vagina. The patient tolerated the procedure well.  Sponge, lap and needle counts were correct x2.  The patient was taken to recovery room in excellent condition.  Antibiotics: Given 1st or 2nd generation cephalosporin, Antibiotics given within 1 hour of the start of the procedure, Antibiotics ordered to be discontinued within 24 hours post procedure   VTE prophylaxis: was ordered perioperatively.  Wound: Clean contaminated.  Gillis Ends, MD

## 2015-05-09 NOTE — Transfer of Care (Signed)
Immediate Anesthesia Transfer of Care Note  Patient: Stacey Torres  Procedure(s) Performed: Procedure(s): DILATATION AND CURETTAGE /HYSTEROSCOPY, Myosure (N/A)  Patient Location: PACU  Anesthesia Type:General  Level of Consciousness: awake, alert  and oriented  Airway & Oxygen Therapy: Patient Spontanous Breathing and Patient connected to face mask oxygen  Post-op Assessment: Report given to RN and Post -op Vital signs reviewed and stable  Post vital signs: Reviewed and stable  Last Vitals:  Filed Vitals:   05/09/15 1202  BP: 135/78  Pulse: 77  Temp: 36.3 C  Resp: 17    Complications: No apparent anesthesia complications

## 2015-05-09 NOTE — Anesthesia Postprocedure Evaluation (Signed)
  Anesthesia Post-op Note  Patient: Stacey Torres  Procedure(s) Performed: Procedure(s): DILATATION AND CURETTAGE /HYSTEROSCOPY, Myosure (N/A)  Anesthesia type:General  Patient location: PACU  Post pain: Pain level controlled  Post assessment: Post-op Vital signs reviewed, Patient's Cardiovascular Status Stable, Respiratory Function Stable, Patent Airway and No signs of Nausea or vomiting  Post vital signs: Reviewed and stable  Last Vitals:  Filed Vitals:   05/09/15 1203  BP:   Pulse:   Temp: 36.3 C  Resp:     Level of consciousness: awake, alert  and patient cooperative  Complications: No apparent anesthesia complications

## 2015-05-09 NOTE — Anesthesia Preprocedure Evaluation (Addendum)
Anesthesia Evaluation  Patient identified by MRN, date of birth, ID band Patient awake    Reviewed: Allergy & Precautions, NPO status , Patient's Chart, lab work & pertinent test results  Airway Mallampati: II  TM Distance: >3 FB Neck ROM: Limited    Dental  (+) Poor Dentition, Missing   Pulmonary former smoker,          Cardiovascular Exercise Tolerance: Poor hypertension, Rhythm:Regular Rate:Normal     Neuro/Psych Cerebral palsy, hemiplegia--avoid succinylcholine.  Neuromuscular disease    GI/Hepatic   Endo/Other    Renal/GU      Musculoskeletal  (+) Arthritis -, Rheumatoid disorders,    Abdominal (+)  Abdomen: soft.    Peds  Hematology   Anesthesia Other Findings   Reproductive/Obstetrics                           Anesthesia Physical Anesthesia Plan  ASA: III  Anesthesia Plan: General   Post-op Pain Management:    Induction: Intravenous  Airway Management Planned: LMA  Additional Equipment:   Intra-op Plan:   Post-operative Plan: Extubation in OR  Informed Consent: I have reviewed the patients History and Physical, chart, labs and discussed the procedure including the risks, benefits and alternatives for the proposed anesthesia with the patient or authorized representative who has indicated his/her understanding and acceptance.   Consent reviewed with POA  Plan Discussed with: CRNA  Anesthesia Plan Comments:         Anesthesia Quick Evaluation

## 2015-05-09 NOTE — Interval H&P Note (Signed)
History and Physical Interval Note:  05/09/2015 10:32 AM  Stacey Torres  has presented today for surgery, with the diagnosis of THICKENED ENDOMETRIAL STRIPE, VAGINAL BLEEDING ON RADIOLOGY IMAGING  The various methods of treatment have been discussed with the patient and family. After consideration of risks, benefits and other options for treatment, the patient has consented to  Procedure(s): DILATATION AND CURETTAGE /HYSTEROSCOPY (N/A) as a surgical intervention .  The patient's history has been reviewed, patient examined, no change in status, stable for surgery.  I have reviewed the patient's chart and labs.  Questions were answered to the patient's satisfaction.     Stacey Torres

## 2015-05-09 NOTE — Discharge Summary (Signed)
Gyn/Onc Discharge Summary    Patient Name: Stacey Torres Date of Birth: 10/03/1929   Facility: [facility] Attending Physician: Gillis Ends, MD   Date of Admission/Discharge: 05/09/2015  Principal Discharge Diagnosis: Thickened endometrial stripe and vaginal bleeding.  Procedure During Admission: EUA, D&C, Hysteroscopy, Myosure   Reason for Hospitalization Stacey Torres  is a pleasant female who was admitted for above noted surgery. Please see operative note for details.   Condition at Discharge: Patient tolerated the procedure without difficulty. Discharged to home when PACU criteria were met.  Condition: good  Follow-up:  Appointments scheduled for 4-6 weeks with Dr. Gillis Ends, MD.    Discharge Medications  Hydrocodone and acetaminophen (325/5 mg) take one tablet every 6 hours prn pain   Patient to continue usual meds as noted below   [facility] Gaetana Michaelis, MD   05/09/2015

## 2015-05-10 LAB — SURGICAL PATHOLOGY

## 2015-05-16 ENCOUNTER — Telehealth: Payer: Self-pay | Admitting: *Deleted

## 2015-05-16 DIAGNOSIS — R634 Abnormal weight loss: Secondary | ICD-10-CM

## 2015-05-16 DIAGNOSIS — I10 Essential (primary) hypertension: Secondary | ICD-10-CM

## 2015-05-16 DIAGNOSIS — N950 Postmenopausal bleeding: Secondary | ICD-10-CM

## 2015-05-16 DIAGNOSIS — M0580 Other rheumatoid arthritis with rheumatoid factor of unspecified site: Secondary | ICD-10-CM

## 2015-05-16 DIAGNOSIS — F419 Anxiety disorder, unspecified: Secondary | ICD-10-CM

## 2015-05-16 DIAGNOSIS — I4891 Unspecified atrial fibrillation: Secondary | ICD-10-CM | POA: Diagnosis not present

## 2015-05-16 DIAGNOSIS — N138 Other obstructive and reflux uropathy: Secondary | ICD-10-CM

## 2015-05-16 DIAGNOSIS — R251 Tremor, unspecified: Secondary | ICD-10-CM

## 2015-05-16 NOTE — Telephone Encounter (Signed)
Left msg for Stacey Torres.   Results normal. Please have patient keep the appointment with Dr. Theora Gianotti at the end of the month.

## 2015-05-16 NOTE — Telephone Encounter (Signed)
-----   Message from [place], MD sent at 05/14/2015 11:48 AM EDT ----- Hello,  Can you please contact Ms. Mcduffee and her niece. Great news. - no cancer.  Plan to follow up in 4-6 weeks.    A. ENDOCERVIX; CURETTINGS:  - RARE STRIPS OF UNREMARKABLE ENDOCERVICAL GLANDULAR EPITHELIUM.  - RARE FRAGMENT OF UNREMARKABLE SQUAMOUS EPITHELIUM.  - BLOOD.   B. ENDOMETRIUM; CURETTINGS:  - RARE STRIPS OF ATROPHIC ENDOMETRIUM.  - BLOOD.   C. ENDOMETRIUM, MYOSURE SPECIMEN:  - MYOMETRIUM AND INACTIVE ENDOMETRIUM WITH FOCAL CHANGES SUGGESTIVE OF  ENDOMETRIAL POLYP.  - NEGATIVE FOR ATYPIA AND MALIGNANCY.    Thank you.  Sincerely [place]

## 2015-05-21 DIAGNOSIS — M05111 Rheumatoid lung disease with rheumatoid arthritis of right shoulder: Secondary | ICD-10-CM | POA: Diagnosis not present

## 2015-05-21 DIAGNOSIS — M05112 Rheumatoid lung disease with rheumatoid arthritis of left shoulder: Secondary | ICD-10-CM | POA: Diagnosis not present

## 2015-05-28 ENCOUNTER — Telehealth: Payer: Self-pay

## 2015-05-28 DIAGNOSIS — R509 Fever, unspecified: Secondary | ICD-10-CM | POA: Diagnosis not present

## 2015-05-28 NOTE — Telephone Encounter (Signed)
PLEASE NOTE: All timestamps contained within this report are represented as [place] Standard Time. CONFIDENTIALTY NOTICE: This fax transmission is intended only for the addressee. It contains information that is legally privileged, confidential or otherwise protected from use or disclosure. If you are not the intended recipient, you are strictly prohibited from reviewing, disclosing, copying using or disseminating any of this information or taking any action in reliance on or regarding this information. If you have received this fax in error, please notify us immediately by telephone so that we can arrange for its return to [place]. Phone: [phone], Toll-Free: [phone], Fax: [phone] Page: 1 of 1 Call Id: [id number] [place] Patient Name: Stacey Torres Gender: Female DOB: February 23, 1929 Age: 7 Y 13 M 9 D Return Phone Number: [phone] (Primary) Address: City/State/Zip: [place] [address] Client  [facility] Night - Client Client Site [place] Physician Viviana Simpler Contact Type Call Call Type Page Only Caller Name Becky Relationship To Patient Provider Is this call to report lab results? No Return Phone Number [phone] (Primary) Initial Comment Caller states Becky w/ [facility] [phone] - running low grade fever for the last 2 days, Recent D&C and admitted to hospital for Sepsis. c/o runny nose - needs to let Dr know what is going on. Nurse Assessment Guidelines Guideline Title Affirmed Question Affirmed Notes Nurse Date/Time (Eastern Time) Disp. Time Eilene Ghazi Time) Disposition Final User 05/27/2015 10:47:04 AM Send to [place], [place] Cerise 05/27/2015 10:55:14 AM Paged On Call to Other Provider Dolphus Jenny 05/27/2015 10:55:36 AM Page Completed Yes Dolphus Jenny After Care Instructions  Given Call Event Type User Date / Time Description Paging DoctorName Phone DateTime Result/Outcome Message Type Notes Charlann Boxer [phone] 05/27/2015 10:55:14 AM Paged On Call to Other Provider Doctor Paged Charlann Boxer 05/27/2015 10:55:28 AM Paged On Call to Another Provider Message Result connected on-call with facility.

## 2015-05-28 NOTE — Telephone Encounter (Signed)
Seen today Seems to be self limited viral illness

## 2015-06-04 DIAGNOSIS — R5381 Other malaise: Secondary | ICD-10-CM | POA: Diagnosis not present

## 2015-06-04 DIAGNOSIS — H538 Other visual disturbances: Secondary | ICD-10-CM

## 2015-06-06 ENCOUNTER — Ambulatory Visit: Payer: Medicare Other

## 2015-06-13 ENCOUNTER — Inpatient Hospital Stay: Payer: Medicare Other | Attending: Obstetrics and Gynecology | Admitting: Obstetrics and Gynecology

## 2015-06-13 VITALS — BP 121/79 | HR 108 | Temp 98.2°F

## 2015-06-13 DIAGNOSIS — R9389 Abnormal findings on diagnostic imaging of other specified body structures: Secondary | ICD-10-CM

## 2015-06-13 NOTE — Progress Notes (Signed)
Gynecologic Oncology Consult Visit   Referring Provider: Prentice Docker, MD  PCP: Venia Carbon, MD [address] [place], [place] [address] [phone]  Chief Concern: endometrial thickness on radiology imaging and history of vaginal bleeding. Post op visit.  Subjective:  Stacey Torres is a 81 y.o. female who is seen in consultation from Dr. Glennon Mac endometrial thickness on radiology imaging and history of vaginal bleeding. She was previously seen by Drs. Earney Navy and OB/GYN who recommended consultation with gynecologic oncology.   The patient is a resident at [facility] for about 3 years. She has recently been admitted to [facility]. A PAP that revealed benign endometrial cells.Pelvic ultrasound revealed ES was 30mm which is elevated for her age.Due to the patient's cerebral palsy, pelvic examination and EMBx are very difficult and she was referred to [facility].   She had a Hysteroscopy and D&C on 05/09/15 by Dr Theora Gianotti and only pathology was a small benign endometrial polyp. Bleeding has stopped.  05/09/15 A. ENDOCERVIX; CURETTINGS:  - RARE STRIPS OF UNREMARKABLE ENDOCERVICAL GLANDULAR EPITHELIUM.  - RARE FRAGMENT OF UNREMARKABLE SQUAMOUS EPITHELIUM.  - BLOOD.   B. ENDOMETRIUM; CURETTINGS:  - RARE STRIPS OF ATROPHIC ENDOMETRIUM.  - BLOOD.   C. ENDOMETRIUM, MYOSURE SPECIMEN:  - MYOMETRIUM AND INACTIVE ENDOMETRIUM WITH FOCAL CHANGES SUGGESTIVE OF  ENDOMETRIAL POLYP.  - NEGATIVE FOR ATYPIA AND MALIGNANCY.    03/13/2015 Pelvic Ultrasound  FINDINGS: Uterus  Measurements: 4.6 x 2.5 x 3.3 cm. No fibroids or other mass visualized.  Endometrium  Thickness: 7 mm. The endometrium appears mildly expanded however is poorly visualized on transabdominal images.  Right ovary  Measurements: 2.7 x 1.6 x 2.0 cm. There is a 2.1 x 1.6 x 2.1 cm hypoechoic lesion within the right ovary.  Left ovary  Not visualized.  Other findings: No free fluid in the  pelvis. There is a large amount of mixed echogenicity material demonstrated within the bladder lumen measuring 5.6 x 3.3 x 5.5 cm.  IMPRESSION: Limited exam as only transabdominal images were able to performed, markedly limiting evaluation of the endometrium.  The endometrium appears thickened measuring up to 7 mm and mildly expanded with possible associated fluid. Endometrial mass not excluded. In the setting of post-menopausal bleeding, endometrial sampling is indicated to exclude carcinoma. If results are benign, sonohysterogram should be considered for focal lesion work-up. (Ref: Radiological Reasoning: Algorithmic Workup of Abnormal Vaginal Bleeding with Endovaginal Sonography and Sonohysterography. AJR 2008; 458:K99-83)  Large amount of mixed echogenicity material within the bladder lumen. As the patient was not able to rotate, it is unclear if this is mobile. Findings may represent bladder debris however underlying intraluminal bladder mass is not excluded. Recommend correlation with direct visualization.  There is a 2.1 cm cyst within the right ovary. Recommend follow-up ultrasound in 12 months.   04/03/2015 CT A/P  Reproductive: Uterus and ovaries appear normal.  Other: The bladder is distended with concentric wall thickening at upper limits of normal measuring 3 mm image 70. No free fluid or free air.  IMPRESSION: Progression of previously seen right greater than left hydroureteronephrosis with mild concentric bladder wall thickening. Hypo enhancement of the right kidney is compatible with obstructive uropathy. This could be due to chronic cystitis, infiltrative bladder malignancy, non radiopaque stones, or strictures.  Slight progression of intrahepatic ductal dilatation and common duct dilatation status post cholecystectomy, with tapering to the ampulla.   Initially she opted for no further evaluation but then she changed her mind and would  like to  proceed with further surgical evaluation. Dr. Silvio Pate has cleared her for surgery.   Problem List: Patient Active Problem List   Diagnosis Date Noted  . Endometrial thickening on ultra sound   . Malnutrition of moderate degree 04/02/2015  . Sepsis due to urinary tract infection 04/01/2015  . Rheumatoid arthritis 04/01/2015  . Essential hypertension 04/01/2015  . Hydronephrosis of right kidney 11/21/2013    Past Medical History: Past Medical History  Diagnosis Date  . Cerebral palsy, hemiplegic     pt is intelligant  . Neurologic gait dysfunction   . Spinal stenosis   . Urge urinary incontinence   . Hypertension   . Arthritis   . Neurogenic bladder   . Hydronephrosis, right   . Chronic cystitis   . Gross hematuria   . RA (rheumatoid arthritis)     advanced  . Flexion contracture joint of multiple sites     secondary to advanced ra  . Slurred speech   . Cancer skin ca  . Seasonal allergies   . UTI (lower urinary tract infection)   . Sepsis due to urinary tract infection   . Cough   . RA (rheumatoid arthritis)   . Edema, lower extremity   . Cataract     Past Surgical History: Past Surgical History  Procedure Laterality Date  . Total knee arthroplasty Right 2005  . Cholecystectomy  1995  . Cystoscopy with retrograde pyelogram, ureteroscopy and stent placement Bilateral 11/21/2013    Procedure: CYSTOSCOPY WITH RIGHT RETROGRADE PYELOGRAM, RIGHT FLEXIBLE and rigid URETEROSCOPY  PLACEMENT;  Surgeon: Bernestine Amass, MD;  Location: [facility];  Service: Urology;  Laterality: Bilateral;  . Cystogram  11/21/2013    Procedure: CYSTOGRAM;  Surgeon: Bernestine Amass, MD;  Location: [facility]  Service: Urology;;  . Dg toes rt foot multiple specif ([facility] hx)    . Hysteroscopy w/d&c N/A 05/09/2015    Procedure: DILATATION AND CURETTAGE Pollyann Glen, Myosure;  Surgeon: Gillis Ends, MD;  Location: [facility];  Service: Gynecology;  Laterality:  N/A;    Past Gynecologic History:  See HPI  OB History:  OB History  No data available    Family History: Family History  Problem Relation Age of Onset  . CAD Mother   . Deep vein thrombosis Father   . Breast cancer Sister     Social History: History   Social History  . Marital Status: Single    Spouse Name: N/A  . Number of Children: N/A  . Years of Education: N/A   Occupational History  . Not on file.   Social History Main Topics  . Smoking status: Former Smoker    Quit date: 11/18/1958  . Smokeless tobacco: Never Used  . Alcohol Use: No  . Drug Use: No  . Sexual Activity: Not on file   Other Topics Concern  . Not on file   Social History Narrative    Allergies: No Known Allergies  Current Medications: Current Outpatient Prescriptions  Medication Sig Dispense Refill  . acetaminophen (TYLENOL) 650 MG CR tablet Take 650 mg by mouth 2 (two) times daily as needed for pain.    Marland Kitchen ALPRAZolam (XANAX) 0.25 MG tablet Take 1 tablet (0.25 mg total) by mouth 2 (two) times daily as needed. 10 tablet 0  . ALPRAZolam (XANAX) 0.25 MG tablet Take 1 tablet (0.25 mg total) by mouth 3 (three) times daily. (Patient not taking: Reported on 04/25/2015) 30 tablet 0  . amoxicillin-clavulanate (  AUGMENTIN) 875-125 MG per tablet Take 1 tablet by mouth 2 (two) times daily.    . Ascorbic Acid (VITAMIN C) 1000 MG tablet Take 1,000 mg by mouth daily.    . benzonatate (TESSALON) 100 MG capsule Take 200 mg by mouth 3 (three) times daily as needed for cough.    . Calcium Carbonate-Vitamin D (CALCIUM 600+D) 600-400 MG-UNIT per tablet Take 1 tablet by mouth every evening.    Marland Kitchen desonide (DESOWEN) 0.05 % cream Apply topically.    Marland Kitchen dextromethorphan 15 MG/5ML syrup Take 10 mLs by mouth 4 (four) times daily as needed for cough.    . diphenhydrAMINE (SOMINEX) 25 MG tablet Take 25 mg by mouth 2 (two) times daily as needed for sleep.    . DULoxetine (CYMBALTA) 60 MG capsule Take 60 mg by mouth every  morning.    . Emollient (CERAVE) LOTN Apply topically.    . feeding supplement, ENSURE ENLIVE, (ENSURE ENLIVE) LIQD Take 237 mLs by mouth 2 (two) times daily between meals. 034 mL 12  . folic acid (FOLVITE) 1 MG tablet Take 1 mg by mouth daily.    Marland Kitchen gabapentin (NEURONTIN) 100 MG capsule Take 100 mg by mouth 2 (two) times daily.    . hydroxychloroquine (PLAQUENIL) 200 MG tablet Take 200 mg by mouth 2 (two) times daily.    . hydroxypropyl methylcellulose / hypromellose (ISOPTO TEARS / GONIOVISC) 2.5 % ophthalmic solution Place 2 drops into both eyes 2 (two) times daily as needed for dry eyes.    Marland Kitchen ipratropium (ATROVENT) 0.03 % nasal spray Place 2 sprays into both nostrils 2 (two) times daily.    Marland Kitchen ketoconazole (NIZORAL) 2 % cream Apply 1 application topically 2 (two) times daily.    Marland Kitchen loratadine (CLARITIN) 10 MG tablet Take 10 mg by mouth every morning.     . magnesium hydroxide (MILK OF MAGNESIA) 400 MG/5ML suspension Take 30 mLs by mouth 2 (two) times daily as needed for mild constipation.     . methotrexate (RHEUMATREX) 2.5 MG tablet Take 2.5 mg by mouth once a week. Caution:Chemotherapy. Protect from light.    . metoprolol succinate (TOPROL-XL) 50 MG 24 hr tablet Take 50 mg by mouth 2 (two) times daily. Take with or immediately following a meal.    . Multiple Vitamin (MULTIVITAMIN) tablet Take 1 tablet by mouth daily.    . ondansetron (ZOFRAN) 4 MG tablet Take 4 mg by mouth every 4 (four) hours as needed for nausea or vomiting.     . polyethylene glycol (MIRALAX / GLYCOLAX) packet Take 17 g by mouth at bedtime.    . primidone (MYSOLINE) 50 MG tablet Take 50 mg by mouth 2 (two) times daily.     Marland Kitchen senna-docusate (SENOKOT-S) 8.6-50 MG per tablet Take 2 tablets by mouth 2 (two) times daily as needed for mild constipation.    . sennosides-docusate sodium (SENOKOT-S) 8.6-50 MG tablet Take 1 tablet by mouth at bedtime.    Marland Kitchen therapeutic multivitamin-minerals (THERAGRAN-M) tablet Take 1 tablet by mouth  every morning.    . traMADol (ULTRAM) 50 MG tablet Take 1 tablet (50 mg total) by mouth every 8 (eight) hours as needed. 15 tablet 0  . traZODone (DESYREL) 50 MG tablet Take 50 mg by mouth at bedtime.    Marland Kitchen zinc oxide 20 % ointment Apply 1 application topically as needed for irritation.     No current facility-administered medications for this visit.    Review of Systems per HPI  Objective:  Physical Examination:  BP 121/79 mmHg  Pulse 108  Temp(Src) 98.2 F (36.8 C) (Oral)  Wt    ECOG Performance Status: 3 - Symptomatic, >50% confined to bed  General appearance: alert, cooperative and appears stated age  In wheelchair with Foley catheter in place. She is accompanied by her niece who wil also be her 70.  HEENT:PERRLA and sclera clear, anicteric Lymph node survey: non-palpable, axillary, supraclavicular Cardiovascular: regular rate and rhythm Respiratory: normal air entry on the right, decreased on the left, lungs clear to auscultation Abdomen: soft, non-tender, without masses or organomegaly and no hernias Extremities: extremities normal, atraumatic, no cyanosis or edema Pelvic: Deferred   Lab Review Lab Results  Component Value Date   WBC 5.1 04/05/2015   HGB 8.7* 04/05/2015   HCT 27.5* 04/05/2015   MCV 100.0 04/05/2015   PLT 289 04/05/2015   04/04/2015 Urine culture: multiple species noted    Chemistry      Component Value Date/Time   NA 137 04/03/2015 0637   K 3.5 04/03/2015 0637   CL 105 04/03/2015 0637   CO2 27 04/03/2015 0637   BUN 9 04/03/2015 0637   CREATININE 0.39* 04/03/2015 0637   CREATININE 0.62 05/02/2013 1004      Component Value Date/Time   CALCIUM 8.1* 04/03/2015 0637   ALKPHOS 65 04/01/2015 2020   AST 17 04/01/2015 2020   ALT 7* 04/01/2015 2020   BILITOT 0.2* 04/01/2015 2020     MRSA negative 04/02/2015  Radiologic Imaging: Ultrasound reviewed    Assessment:  Stacey Torres is a 81 y.o. female diagnosed with thickened  endometrial stripe and vaginal bleeding. Benign endometrial polyp removed at recent D&C.  No other pathology found.   Medical co-morbidities complicating care: cerebral palsy, neurologic gain dysfunction, frailty, rheumatoid arthritis, multiple contractures, bedbound.    Plan:   Problem List Items Addressed This Visit      Genitourinary   Endometrial thickening on ultra sound - Primary     Suggested return in the future as needed, since pathology benign. Follow up with general medical doctor for other health issues.  Mellody Drown, MD  CC:  Venia Carbon, MD [address] [place], [place] [address] [phone]

## 2015-06-25 DIAGNOSIS — R634 Abnormal weight loss: Secondary | ICD-10-CM

## 2015-06-28 DIAGNOSIS — E440 Moderate protein-calorie malnutrition: Secondary | ICD-10-CM

## 2015-07-06 ENCOUNTER — Emergency Department: Payer: Medicare Other

## 2015-07-06 ENCOUNTER — Inpatient Hospital Stay
Admission: EM | Admit: 2015-07-06 | Discharge: 2015-07-08 | DRG: 871 | Disposition: A | Discharge status: Skilled Nursing Facility | Payer: Medicare Other | Attending: Internal Medicine | Admitting: Internal Medicine

## 2015-07-06 ENCOUNTER — Encounter: Payer: Self-pay | Admitting: Emergency Medicine

## 2015-07-06 DIAGNOSIS — Z87891 Personal history of nicotine dependence: Secondary | ICD-10-CM

## 2015-07-06 DIAGNOSIS — Z9049 Acquired absence of other specified parts of digestive tract: Secondary | ICD-10-CM | POA: Diagnosis present

## 2015-07-06 DIAGNOSIS — Z85828 Personal history of other malignant neoplasm of skin: Secondary | ICD-10-CM

## 2015-07-06 DIAGNOSIS — N390 Urinary tract infection, site not specified: Secondary | ICD-10-CM | POA: Diagnosis present

## 2015-07-06 DIAGNOSIS — Z681 Body mass index (BMI) 19 or less, adult: Secondary | ICD-10-CM

## 2015-07-06 DIAGNOSIS — M1990 Unspecified osteoarthritis, unspecified site: Secondary | ICD-10-CM | POA: Diagnosis present

## 2015-07-06 DIAGNOSIS — E871 Hypo-osmolality and hyponatremia: Secondary | ICD-10-CM

## 2015-07-06 DIAGNOSIS — Z8249 Family history of ischemic heart disease and other diseases of the circulatory system: Secondary | ICD-10-CM

## 2015-07-06 DIAGNOSIS — E43 Unspecified severe protein-calorie malnutrition: Secondary | ICD-10-CM | POA: Diagnosis present

## 2015-07-06 DIAGNOSIS — Z87440 Personal history of urinary (tract) infections: Secondary | ICD-10-CM | POA: Diagnosis not present

## 2015-07-06 DIAGNOSIS — E875 Hyperkalemia: Secondary | ICD-10-CM | POA: Diagnosis present

## 2015-07-06 DIAGNOSIS — Z803 Family history of malignant neoplasm of breast: Secondary | ICD-10-CM | POA: Diagnosis not present

## 2015-07-06 DIAGNOSIS — R110 Nausea: Secondary | ICD-10-CM

## 2015-07-06 DIAGNOSIS — G809 Cerebral palsy, unspecified: Secondary | ICD-10-CM | POA: Diagnosis present

## 2015-07-06 DIAGNOSIS — Z79899 Other long term (current) drug therapy: Secondary | ICD-10-CM

## 2015-07-06 DIAGNOSIS — N3000 Acute cystitis without hematuria: Secondary | ICD-10-CM | POA: Diagnosis present

## 2015-07-06 DIAGNOSIS — Z888 Allergy status to other drugs, medicaments and biological substances status: Secondary | ICD-10-CM | POA: Diagnosis not present

## 2015-07-06 DIAGNOSIS — I10 Essential (primary) hypertension: Secondary | ICD-10-CM | POA: Diagnosis present

## 2015-07-06 DIAGNOSIS — M069 Rheumatoid arthritis, unspecified: Secondary | ICD-10-CM | POA: Diagnosis present

## 2015-07-06 DIAGNOSIS — E860 Dehydration: Secondary | ICD-10-CM | POA: Diagnosis present

## 2015-07-06 DIAGNOSIS — Z96651 Presence of right artificial knee joint: Secondary | ICD-10-CM | POA: Diagnosis present

## 2015-07-06 DIAGNOSIS — Z9889 Other specified postprocedural states: Secondary | ICD-10-CM

## 2015-07-06 DIAGNOSIS — A419 Sepsis, unspecified organism: Secondary | ICD-10-CM | POA: Diagnosis present

## 2015-07-06 DIAGNOSIS — K920 Hematemesis: Secondary | ICD-10-CM | POA: Diagnosis present

## 2015-07-06 DIAGNOSIS — F329 Major depressive disorder, single episode, unspecified: Secondary | ICD-10-CM | POA: Diagnosis present

## 2015-07-06 DIAGNOSIS — R710 Precipitous drop in hematocrit: Secondary | ICD-10-CM | POA: Diagnosis present

## 2015-07-06 DIAGNOSIS — N3090 Cystitis, unspecified without hematuria: Secondary | ICD-10-CM

## 2015-07-06 DIAGNOSIS — K219 Gastro-esophageal reflux disease without esophagitis: Secondary | ICD-10-CM

## 2015-07-06 DIAGNOSIS — K922 Gastrointestinal hemorrhage, unspecified: Secondary | ICD-10-CM | POA: Diagnosis present

## 2015-07-06 DIAGNOSIS — Z66 Do not resuscitate: Secondary | ICD-10-CM | POA: Diagnosis present

## 2015-07-06 LAB — URINALYSIS COMPLETE WITH MICROSCOPIC (ARMC ONLY)
BILIRUBIN URINE: NEGATIVE
Glucose, UA: NEGATIVE mg/dL
KETONES UR: NEGATIVE mg/dL
Nitrite: NEGATIVE
PH: 7 (ref 5.0–8.0)
Protein, ur: >500 mg/dL — AB
SQUAMOUS EPITHELIAL / LPF: NONE SEEN
Specific Gravity, Urine: 1.017 (ref 1.005–1.030)

## 2015-07-06 LAB — CBC WITH DIFFERENTIAL/PLATELET
BASOS ABS: 0.1 10*3/uL (ref 0–0.1)
Basophils Relative: 0 %
EOS PCT: 0 %
Eosinophils Absolute: 0 10*3/uL (ref 0–0.7)
HCT: 34.1 % — ABNORMAL LOW (ref 35.0–47.0)
HEMOGLOBIN: 11.1 g/dL — AB (ref 12.0–16.0)
LYMPHS PCT: 5 %
Lymphs Abs: 1.4 10*3/uL (ref 1.0–3.6)
MCH: 31.2 pg (ref 26.0–34.0)
MCHC: 32.6 g/dL (ref 32.0–36.0)
MCV: 95.6 fL (ref 80.0–100.0)
Monocytes Absolute: 1.3 10*3/uL — ABNORMAL HIGH (ref 0.2–0.9)
Monocytes Relative: 4 %
NEUTROS ABS: 28 10*3/uL — AB (ref 1.4–6.5)
NEUTROS PCT: 91 %
PLATELETS: 532 10*3/uL — AB (ref 150–440)
RBC: 3.56 MIL/uL — AB (ref 3.80–5.20)
RDW: 14.1 % (ref 11.5–14.5)
WBC: 30.8 10*3/uL — AB (ref 3.6–11.0)

## 2015-07-06 LAB — COMPREHENSIVE METABOLIC PANEL
ALT: 9 U/L — ABNORMAL LOW (ref 14–54)
ANION GAP: 11 (ref 5–15)
AST: 26 U/L (ref 15–41)
Albumin: 2.7 g/dL — ABNORMAL LOW (ref 3.5–5.0)
Alkaline Phosphatase: 90 U/L (ref 38–126)
BILIRUBIN TOTAL: 0.4 mg/dL (ref 0.3–1.2)
BUN: 49 mg/dL — AB (ref 6–20)
CO2: 26 mmol/L (ref 22–32)
Calcium: 9.1 mg/dL (ref 8.9–10.3)
Chloride: 85 mmol/L — ABNORMAL LOW (ref 101–111)
Creatinine, Ser: 0.6 mg/dL (ref 0.44–1.00)
Glucose, Bld: 107 mg/dL — ABNORMAL HIGH (ref 65–99)
POTASSIUM: 5.6 mmol/L — AB (ref 3.5–5.1)
Sodium: 122 mmol/L — ABNORMAL LOW (ref 135–145)
TOTAL PROTEIN: 7 g/dL (ref 6.5–8.1)

## 2015-07-06 LAB — LIPASE, BLOOD: LIPASE: 14 U/L — AB (ref 22–51)

## 2015-07-06 MED ORDER — SODIUM CHLORIDE 0.9 % IV SOLN
INTRAVENOUS | Status: DC
  Administered 2015-07-06: 1000 mL via INTRAVENOUS
  Administered 2015-07-07 – 2015-07-08 (×3): via INTRAVENOUS

## 2015-07-06 MED ORDER — ARTIFICIAL TEARS OP OINT
TOPICAL_OINTMENT | OPHTHALMIC | Status: DC | PRN
  Filled 2015-07-06: qty 3.5

## 2015-07-06 MED ORDER — CERAVE EX CREA: 1.0000 "application " | TOPICAL_CREAM | Freq: Every evening | CUTANEOUS | Status: DC

## 2015-07-06 MED ORDER — SODIUM CHLORIDE 0.9 % IV BOLUS (SEPSIS)
1000.0000 mL | Freq: Once | INTRAVENOUS | Status: AC
  Administered 2015-07-06: 1000 mL via INTRAVENOUS

## 2015-07-06 MED ORDER — ONDANSETRON HCL 4 MG/2ML IJ SOLN
4.0000 mg | Freq: Four times a day (QID) | INTRAMUSCULAR | Status: DC | PRN
Start: 2015-07-06 — End: 2015-07-08

## 2015-07-06 MED ORDER — SENNOSIDES-DOCUSATE SODIUM 8.6-50 MG PO TABS
1.0000 | ORAL_TABLET | Freq: Every day | ORAL | Status: DC
  Administered 2015-07-07 (×2): 1 via ORAL
  Filled 2015-07-06 (×2): qty 1

## 2015-07-06 MED ORDER — IOHEXOL 300 MG/ML  SOLN
80.0000 mL | Freq: Once | INTRAMUSCULAR | Status: AC | PRN
  Administered 2015-07-06: 80 mL via INTRAVENOUS

## 2015-07-06 MED ORDER — ALUM & MAG HYDROXIDE-SIMETH 200-200-20 MG/5ML PO SUSP: 30.0000 mL | ORAL | Status: DC | PRN

## 2015-07-06 MED ORDER — METOPROLOL SUCCINATE ER 50 MG PO TB24
50.0000 mg | ORAL_TABLET | Freq: Two times a day (BID) | ORAL | Status: DC
  Administered 2015-07-07: 22:00:00 50 mg via ORAL
  Filled 2015-07-06 (×3): qty 1

## 2015-07-06 MED ORDER — ONDANSETRON HCL 4 MG PO TABS: 4.0000 mg | ORAL_TABLET | Freq: Four times a day (QID) | ORAL | Status: DC | PRN

## 2015-07-06 MED ORDER — CALCIUM CARBONATE-VITAMIN D 500-200 MG-UNIT PO TABS
1.0000 | ORAL_TABLET | Freq: Every evening | ORAL | Status: DC
  Administered 2015-07-07: 1 via ORAL
  Filled 2015-07-06: qty 1

## 2015-07-06 MED ORDER — PRIMIDONE 50 MG PO TABS
50.0000 mg | ORAL_TABLET | Freq: Two times a day (BID) | ORAL | Status: DC
  Administered 2015-07-07 – 2015-07-08 (×3): 50 mg via ORAL
  Filled 2015-07-06 (×5): qty 1

## 2015-07-06 MED ORDER — DULOXETINE HCL 60 MG PO CPEP
60.0000 mg | ORAL_CAPSULE | Freq: Every day | ORAL | Status: DC
  Administered 2015-07-07 – 2015-07-08 (×2): 60 mg via ORAL
  Filled 2015-07-06 (×2): qty 1

## 2015-07-06 MED ORDER — ALPRAZOLAM 0.25 MG PO TABS: 0.2500 mg | ORAL_TABLET | Freq: Two times a day (BID) | ORAL | Status: DC | PRN

## 2015-07-06 MED ORDER — CALCIUM CARBONATE-VITAMIN D 600-400 MG-UNIT PO TABS: 1.0000 | ORAL_TABLET | Freq: Every evening | ORAL | Status: DC

## 2015-07-06 MED ORDER — ACETAMINOPHEN 650 MG RE SUPP: 650.0000 mg | Freq: Four times a day (QID) | RECTAL | Status: DC | PRN

## 2015-07-06 MED ORDER — MORPHINE SULFATE (PF) 2 MG/ML IV SOLN: 2.0000 mg | INTRAVENOUS | Status: DC | PRN

## 2015-07-06 MED ORDER — MIRTAZAPINE 15 MG PO TABS
15.0000 mg | ORAL_TABLET | Freq: Every day | ORAL | Status: DC
  Administered 2015-07-07 (×2): 15 mg via ORAL
  Filled 2015-07-06 (×2): qty 1

## 2015-07-06 MED ORDER — MONTELUKAST SODIUM 10 MG PO TABS
10.0000 mg | ORAL_TABLET | Freq: Every evening | ORAL | Status: DC
  Administered 2015-07-07: 10 mg via ORAL
  Filled 2015-07-06: qty 1

## 2015-07-06 MED ORDER — HYDROCERIN EX CREA
TOPICAL_CREAM | Freq: Every evening | CUTANEOUS | Status: DC
  Administered 2015-07-07: 17:00:00 via TOPICAL
  Filled 2015-07-06: qty 113

## 2015-07-06 MED ORDER — ALUMINUM HYDROXIDE EX OINT: 1.0000 "application " | TOPICAL_OINTMENT | CUTANEOUS | Status: DC | PRN

## 2015-07-06 MED ORDER — OXYCODONE HCL 5 MG PO TABS
5.0000 mg | ORAL_TABLET | ORAL | Status: DC | PRN
  Administered 2015-07-07 – 2015-07-08 (×3): 5 mg via ORAL
  Filled 2015-07-06 (×2): qty 1

## 2015-07-06 MED ORDER — ACETAMINOPHEN 325 MG PO TABS
650.0000 mg | ORAL_TABLET | Freq: Four times a day (QID) | ORAL | Status: DC | PRN
  Administered 2015-07-07: 05:00:00 650 mg via ORAL
  Filled 2015-07-06: qty 2

## 2015-07-06 MED ORDER — TRAZODONE HCL 50 MG PO TABS
50.0000 mg | ORAL_TABLET | Freq: Every day | ORAL | Status: DC
  Administered 2015-07-07 (×2): 50 mg via ORAL
  Filled 2015-07-06 (×2): qty 1

## 2015-07-06 MED ORDER — SODIUM CHLORIDE 0.9 % IV SOLN
1.0000 g | Freq: Two times a day (BID) | INTRAVENOUS | Status: DC
  Administered 2015-07-07 – 2015-07-08 (×4): 1 g via INTRAVENOUS
  Filled 2015-07-06 (×7): qty 1

## 2015-07-06 MED ORDER — FOLIC ACID 1 MG PO TABS
1.0000 mg | ORAL_TABLET | Freq: Every day | ORAL | Status: DC
  Administered 2015-07-07 – 2015-07-08 (×2): 1 mg via ORAL
  Filled 2015-07-06 (×2): qty 1

## 2015-07-06 MED ORDER — PANTOPRAZOLE SODIUM 40 MG IV SOLR
40.0000 mg | Freq: Two times a day (BID) | INTRAVENOUS | Status: DC
  Administered 2015-07-06 – 2015-07-08 (×4): 40 mg via INTRAVENOUS
  Filled 2015-07-06 (×4): qty 40

## 2015-07-06 MED ORDER — LORATADINE 10 MG PO TABS
10.0000 mg | ORAL_TABLET | Freq: Every day | ORAL | Status: DC
  Administered 2015-07-07 – 2015-07-08 (×2): 10 mg via ORAL
  Filled 2015-07-06 (×2): qty 1

## 2015-07-06 MED ORDER — HYDROXYCHLOROQUINE SULFATE 200 MG PO TABS
200.0000 mg | ORAL_TABLET | Freq: Two times a day (BID) | ORAL | Status: DC
  Administered 2015-07-07 – 2015-07-08 (×4): 200 mg via ORAL
  Filled 2015-07-06 (×5): qty 1

## 2015-07-06 NOTE — Progress Notes (Signed)
ANTIBIOTIC CONSULT NOTE - INITIAL  Pharmacy Consult for meropenem Indication: UTI/sepsis  Allergies  Allergen Reactions  . Macrobid [Nitrofurantoin] Other (See Comments)    Reaction:  Unknown     Patient Measurements: Height: 5\' 3"  (160 cm) Weight: 95 lb (43.092 kg) IBW/kg (Calculated) : 52.4 Adjusted Body Weight: n/a  Vital Signs: Temp: 98.9 F (37.2 C) (08/26 1642) Temp Source: Oral (08/26 1642) BP: 111/63 mmHg (08/26 2200) Pulse Rate: 120 (08/26 2200) Intake/Output from previous day:   Intake/Output from this shift:    Labs:  Recent Labs  07/06/15 1724  WBC 30.8*  HGB 11.1*  PLT 532*  CREATININE 0.60   Estimated Creatinine Clearance: 36.3 mL/min (by C-G formula based on Cr of 0.6). No results for input(s): VANCOTROUGH, VANCOPEAK, VANCORANDOM, GENTTROUGH, GENTPEAK, GENTRANDOM, TOBRATROUGH, TOBRAPEAK, TOBRARND, AMIKACINPEAK, AMIKACINTROU, AMIKACIN in the last 72 hours.   Microbiology: No results found for this or any previous visit (from the past 720 hour(s)).  Medical History: Past Medical History  Diagnosis Date  . Cerebral palsy, hemiplegic     pt is intelligant  . Neurologic gait dysfunction   . Spinal stenosis   . Urge urinary incontinence   . Hypertension   . Arthritis   . Neurogenic bladder   . Hydronephrosis, right   . Chronic cystitis   . Gross hematuria   . RA (rheumatoid arthritis)     advanced  . Flexion contracture joint of multiple sites     secondary to advanced ra  . Slurred speech   . Cancer skin ca  . Seasonal allergies   . UTI (lower urinary tract infection)   . Sepsis due to urinary tract infection   . Cough   . RA (rheumatoid arthritis)   . Edema, lower extremity   . Cataract     Medications:   Assessment: Blood and urine cx pending UA: LE(+) NO2(-) WBC TNTC CXR: no acute disease  Goal of Therapy:  Resolve infection  Plan:  1 gram q 12 hours ordered.  Pierre Dellarocco S 07/06/2015,11:10 PM

## 2015-07-06 NOTE — ED Notes (Signed)
Patient transported to CT 

## 2015-07-06 NOTE — ED Notes (Signed)
Pt. Going to floor with family

## 2015-07-06 NOTE — ED Provider Notes (Signed)
[facility] Emergency Department Provider Note  ____________________________________________  Time seen: 4:30 PM  I have reviewed the triage vital signs and the nursing notes.   HISTORY  Chief Complaint Emesis  additional history obtained by the patient's family at the bedside   HPI Stacey Torres is a 81 y.o. female who is sent to the ED from [facility] [place] due to an episode of vomiting brown emesis today. By report the patient has had 2-3 days worth of vomiting, but the patient reports that she had one episode today. She also has multiple urinary tract infections in the past, and has been noted to have strong foul-smelling urine today. She also reports a decreased appetite. Denies any change in her bowel movements but is unsure. No chest pain shortness of breath fevers chills.     Past Medical History  Diagnosis Date  . Cerebral palsy, hemiplegic     pt is intelligant  . Neurologic gait dysfunction   . Spinal stenosis   . Urge urinary incontinence   . Hypertension   . Arthritis   . Neurogenic bladder   . Hydronephrosis, right   . Chronic cystitis   . Gross hematuria   . RA (rheumatoid arthritis)     advanced  . Flexion contracture joint of multiple sites     secondary to advanced ra  . Slurred speech   . Cancer skin ca  . Seasonal allergies   . UTI (lower urinary tract infection)   . Sepsis due to urinary tract infection   . Cough   . RA (rheumatoid arthritis)   . Edema, lower extremity   . Cataract     Patient Active Problem List   Diagnosis Date Noted  . Endometrial thickening on ultra sound   . Malnutrition of moderate degree 04/02/2015  . Sepsis due to urinary tract infection 04/01/2015  . Rheumatoid arthritis 04/01/2015  . Essential hypertension 04/01/2015  . Hydronephrosis of right kidney 11/21/2013    Past Surgical History  Procedure Laterality Date  . Total knee arthroplasty Right 2005  . Cholecystectomy  1995  .  Cystoscopy with retrograde pyelogram, ureteroscopy and stent placement Bilateral 11/21/2013    Procedure: CYSTOSCOPY WITH RIGHT RETROGRADE PYELOGRAM, RIGHT FLEXIBLE and rigid URETEROSCOPY  PLACEMENT;  Surgeon: Bernestine Amass, MD;  Location: [facility];  Service: Urology;  Laterality: Bilateral;  . Cystogram  11/21/2013    Procedure: CYSTOGRAM;  Surgeon: Bernestine Amass, MD;  Location: [facility];  Service: Urology;;  . Dg toes rt foot multiple specif ([facility] hx)    . Hysteroscopy w/d&c N/A 05/09/2015    Procedure: DILATATION AND CURETTAGE Pollyann Glen, Myosure;  Surgeon: Gillis Ends, MD;  Location: [facility];  Service: Gynecology;  Laterality: N/A;    Current Outpatient Rx  Name  Route  Sig  Dispense  Refill  . acetaminophen (TYLENOL) 650 MG CR tablet   Oral   Take 650 mg by mouth 3 (three) times daily.          Marland Kitchen ALPRAZolam (XANAX) 0.25 MG tablet   Oral   Take 1 tablet (0.25 mg total) by mouth 2 (two) times daily as needed. Patient taking differently: Take 0.25 mg by mouth 2 (two) times daily.    10 tablet   0   . alum & mag hydroxide-simeth (MAALOX/MYLANTA) 200-200-20 MG/5ML suspension   Oral   Take 30 mLs by mouth every 4 (four) hours as needed for indigestion, heartburn or flatulence.         Marland Kitchen  aluminum hydroxide (DERMAGRAN) ointment   Topical   Apply 1 application topically as needed (to open area on buttocks).         . Ascorbic Acid (VITAMIN C) 1000 MG tablet   Oral   Take 1,000 mg by mouth daily.         . Calcium Carbonate-Vitamin D (CALCIUM 600+D) 600-400 MG-UNIT per tablet   Oral   Take 1 tablet by mouth every evening.         . DULoxetine (CYMBALTA) 60 MG capsule   Oral   Take 60 mg by mouth daily.          . Emollient (CERAVE) CREA   Apply externally   Apply 1 application topically every evening.         . folic acid (FOLVITE) 1 MG tablet   Oral   Take 1 mg by mouth daily.         Marland Kitchen  HYDROcodone-acetaminophen (NORCO/VICODIN) 5-325 MG per tablet   Oral   Take 1 tablet by mouth every 4 (four) hours as needed for moderate pain.         . hydroxychloroquine (PLAQUENIL) 200 MG tablet   Oral   Take 200 mg by mouth 2 (two) times daily.         . Hypromellose (ARTIFICIAL TEARS OP)   Ophthalmic   Apply 2 drops to eye 2 (two) times daily as needed (for dry eyes).         Marland Kitchen loratadine (CLARITIN) 10 MG tablet   Oral   Take 10 mg by mouth daily.          . metoprolol succinate (TOPROL-XL) 50 MG 24 hr tablet   Oral   Take 50 mg by mouth 2 (two) times daily.          . mirtazapine (REMERON) 15 MG tablet   Oral   Take 15 mg by mouth at bedtime.         . montelukast (SINGULAIR) 10 MG tablet   Oral   Take 10 mg by mouth every evening.         . Multiple Vitamin (THEREMS) TABS   Oral   Take 1 tablet by mouth daily.         Marland Kitchen omeprazole (PRILOSEC) 20 MG capsule   Oral   Take 20 mg by mouth daily.         . ondansetron (ZOFRAN) 4 MG tablet   Oral   Take 4 mg by mouth every 4 (four) hours as needed for nausea or vomiting.          . primidone (MYSOLINE) 50 MG tablet   Oral   Take 50 mg by mouth 2 (two) times daily.          . sennosides-docusate sodium (SENOKOT-S) 8.6-50 MG tablet   Oral   Take 1 tablet by mouth at bedtime.         . traZODone (DESYREL) 50 MG tablet   Oral   Take 50 mg by mouth at bedtime.         . ALPRAZolam (XANAX) 0.25 MG tablet   Oral   Take 1 tablet (0.25 mg total) by mouth 3 (three) times daily. Patient not taking: Reported on 07/06/2015   30 tablet   0   . feeding supplement, ENSURE ENLIVE, (ENSURE ENLIVE) LIQD   Oral   Take 237 mLs by mouth 2 (two) times daily between meals. Patient not taking: Reported on 07/06/2015  237 mL   12   . traMADol (ULTRAM) 50 MG tablet   Oral   Take 1 tablet (50 mg total) by mouth every 8 (eight) hours as needed. Patient not taking: Reported on 07/06/2015   15 tablet    0     Allergies Macrobid  Family History  Problem Relation Age of Onset  . CAD Mother   . Deep vein thrombosis Father   . Breast cancer Sister     Social History Social History  Substance Use Topics  . Smoking status: Former Smoker    Quit date: 11/18/1958  . Smokeless tobacco: Never Used  . Alcohol Use: No    Review of Systems  Constitutional: No fever or chills. No weight changes Eyes:No blurry vision or double vision.  ENT: No sore throat. Cardiovascular: No chest pain. Respiratory: No dyspnea or cough. Gastrointestinal: Generalized abdominal discomfort, positive vomiting. No diarrhea.  No BRBPR or melena. Genitourinary: Negative for dysuria, urinary retention, bloody urine, or difficulty urinating. Positive for foul-smelling urine Musculoskeletal: Negative for back pain. No joint swelling or pain. Skin: Negative for rash. Neurological: Negative for headaches, focal weakness or numbness. Psychiatric:No anxiety or depression.   Endocrine:No hot/cold intolerance, changes in energy, or sleep difficulty.  10-point ROS otherwise negative.  ____________________________________________   PHYSICAL EXAM:  VITAL SIGNS: ED Triage Vitals  Enc Vitals Group     BP 07/06/15 1642 107/73 mmHg     Pulse Rate 07/06/15 1642 114     Resp 07/06/15 1642 25     Temp 07/06/15 1642 98.9 F (37.2 C)     Temp Source 07/06/15 1642 Oral     SpO2 07/06/15 1642 100 %     Weight 07/06/15 1642 95 lb (43.092 kg)     Height 07/06/15 1642 5\' 3"  (1.6 m)     Head Cir --      Peak Flow --      Pain Score 07/06/15 1643 0     Pain Loc --      Pain Edu? --      Excl. in [place]? --   Per clinic notes baseline heart rate is 80   Constitutional: Alert and oriented. Well appearing and in no distress. Eyes: No scleral icterus. No conjunctival pallor. PERRL. EOMI ENT   Head: Normocephalic and atraumatic.   Nose: No congestion/rhinnorhea. No septal hematoma   Mouth/Throat: Dry mucous  membranes, no pharyngeal erythema. No peritonsillar mass. No uvula shift.   Neck: No stridor. No SubQ emphysema. No meningismus. Hematological/Lymphatic/Immunilogical: No cervical lymphadenopathy. Cardiovascular: Tachycardia with heart rate 110 to 115. Normal and symmetric distal pulses are present in all extremities. No murmurs, rubs, or gallops. Respiratory: Normal respiratory effort without tachypnea nor retractions. Breath sounds are clear and equal bilaterally. No wheezes/rales/rhonchi. Gastrointestinal: Soft and nontender. No distention. There is no CVA tenderness.  No rebound, rigidity, or guarding. Rectal exam reveals thin stool which is very slightly trace positive with Hemoccult. QC controls okay Genitourinary: deferred Musculoskeletal: Severe contractures and muscle wasting due to cerebral palsy. Neurologic:   Dysarthria at baseline. Normal language.  CN 2-10 normal. Motor grossly intact as able to be tested. Unable to assess cerebellar function or gait. No gross focal neurologic deficits are appreciated.  Skin:  Skin is warm, dry and intact. No rash noted.  No petechiae, purpura, or bullae. Psychiatric: Mood and affect are normal. Speech and behavior are normal. Patient exhibits appropriate insight and judgment.  ____________________________________________    LABS (pertinent positives/negatives) (all labs  ordered are listed, but only abnormal results are displayed) Labs Reviewed  LIPASE, BLOOD - Abnormal; Notable for the following:    Lipase 14 (*)    All other components within normal limits  COMPREHENSIVE METABOLIC PANEL - Abnormal; Notable for the following:    Sodium 122 (*)    Potassium 5.6 (*)    Chloride 85 (*)    Glucose, Bld 107 (*)    BUN 49 (*)    Albumin 2.7 (*)    ALT 9 (*)    All other components within normal limits  CBC WITH DIFFERENTIAL/PLATELET - Abnormal; Notable for the following:    WBC 30.8 (*)    RBC 3.56 (*)    Hemoglobin 11.1 (*)    HCT  34.1 (*)    Platelets 532 (*)    Neutro Abs 28.0 (*)    Monocytes Absolute 1.3 (*)    All other components within normal limits  URINALYSIS COMPLETEWITH MICROSCOPIC ([facility] ONLY) - Abnormal; Notable for the following:    Color, Urine YELLOW (*)    APPearance TURBID (*)    Hgb urine dipstick 1+ (*)    Protein, ur >500 (*)    Leukocytes, UA 2+ (*)    Bacteria, UA MANY (*)    All other components within normal limits  URINE CULTURE   urine red blood cells too numerous to count Urine white blood cells too numerous to count Urine bacteria equals many with white blood cell clumps present ____________________________________________   EKG  Interpreted by me Sinus tachycardia rate 114, normal axis intervals. Poor R-wave progression in anterior precordial leads. Normal ST segments and T waves.  ____________________________________________    RADIOLOGY  Severe right hydronephrosis with an hydroureter with evidence of cystitis on CT Chest x-ray unremarkable  ____________________________________________   PROCEDURES CRITICAL CARE Performed by: Joni Fears, Aksh Swart   Total critical care time: 35 minutes  Critical care time was exclusive of separately billable procedures and treating other patients.  Critical care was necessary to treat or prevent imminent or life-threatening deterioration.  Critical care was time spent personally by me on the following activities: development of treatment plan with patient and/or surrogate as well as nursing, discussions with consultants, evaluation of patient's response to treatment, examination of patient, obtaining history from patient or surrogate, ordering and performing treatments and interventions, ordering and review of laboratory studies, ordering and review of radiographic studies, pulse oximetry and re-evaluation of patient's condition.  ____________________________________________   INITIAL IMPRESSION / ASSESSMENT AND PLAN / ED  COURSE  Pertinent labs & imaging results that were available during my care of the patient were reviewed by me and considered in my medical decision making (see chart for details).  Patient presents with episode of vomiting, trace Hemoccult-positive stool. We'll check labs urinalysis chest x-ray and CT abdomen and pelvis for further evaluation.  ----------------------------------------- 9:52 PM on 07/06/2015 -----------------------------------------  Patient is found to have recurrent urinary tract infection and meet sepsis criteria due to tachycardia which is persistent despite 2 L of IV fluids as well as a severe leukocytosis with a white blood cell count of 30. Also has elevated platelets at 532 which is likely acute phase reactant. She is also found to have hyponatremia to 122. The elevated BUN of 49 is likely due to her GI bleed. Given all of this I think it is prudent to hospitalize the patient again. She has a history of ESBL urinary tract infection, so we will start her on meropenem as that seemed  to work in the past.  ____________________________________________   FINAL CLINICAL IMPRESSION(S) / ED DIAGNOSES  Final diagnoses:  Cystitis   hyponatremia Sepsis due to urinary tract infection Right hydroureteronephrosis    Carrie Mew, MD 07/06/15 2155

## 2015-07-06 NOTE — H&P (Signed)
Stacey Torres at [place] NAME: Stacey Torres    MR#:  [id number]  DATE OF BIRTH:  03/21/1929   DATE OF ADMISSION:  07/06/2015  PRIMARY CARE PHYSICIAN: Viviana Simpler, MD   REQUESTING/REFERRING PHYSICIAN: Joni Fears  CHIEF COMPLAINT:   Chief Complaint  Patient presents with  . Emesis    HISTORY OF PRESENT ILLNESS:  Stacey Torres  is a 81 y.o. female with a known history of cerebral palsy, essential hypertension presenting with nausea and vomiting. One day duration of nausea/vomiting of dark brown material. Associated poor by mouth intake for the last 2-3 days. Denies any fevers or chills positive for subjective fatigue and weakness. She states "I think I have a UTI" denies any further symptomatology at this time  PAST MEDICAL HISTORY:   Past Medical History  Diagnosis Date  . Cerebral palsy, hemiplegic     pt is intelligant  . Neurologic gait dysfunction   . Spinal stenosis   . Urge urinary incontinence   . Hypertension   . Arthritis   . Neurogenic bladder   . Hydronephrosis, right   . Chronic cystitis   . Gross hematuria   . RA (rheumatoid arthritis)     advanced  . Flexion contracture joint of multiple sites     secondary to advanced ra  . Slurred speech   . Cancer skin ca  . Seasonal allergies   . UTI (lower urinary tract infection)   . Sepsis due to urinary tract infection   . Cough   . RA (rheumatoid arthritis)   . Edema, lower extremity   . Cataract     PAST SURGICAL HISTORY:   Past Surgical History  Procedure Laterality Date  . Total knee arthroplasty Right 2005  . Cholecystectomy  1995  . Cystoscopy with retrograde pyelogram, ureteroscopy and stent placement Bilateral 11/21/2013    Procedure: CYSTOSCOPY WITH RIGHT RETROGRADE PYELOGRAM, RIGHT FLEXIBLE and rigid URETEROSCOPY  PLACEMENT;  Surgeon: Bernestine Amass, MD;  Location: [facility];  Service: Urology;  Laterality: Bilateral;   . Cystogram  11/21/2013    Procedure: CYSTOGRAM;  Surgeon: Bernestine Amass, MD;  Location: [facility];  Service: Urology;;  . Dg toes rt foot multiple specif ([facility] hx)    . Hysteroscopy w/d&c N/A 05/09/2015    Procedure: DILATATION AND CURETTAGE Pollyann Glen, Myosure;  Surgeon: Gillis Ends, MD;  Location: [facility];  Service: Gynecology;  Laterality: N/A;    SOCIAL HISTORY:   Social History  Substance Use Topics  . Smoking status: Former Smoker    Quit date: 11/18/1958  . Smokeless tobacco: Never Used  . Alcohol Use: No    FAMILY HISTORY:   Family History  Problem Relation Age of Onset  . CAD Mother   . Deep vein thrombosis Father   . Breast cancer Sister     DRUG ALLERGIES:   Allergies  Allergen Reactions  . Macrobid [Nitrofurantoin] Other (See Comments)    Reaction:  Unknown     REVIEW OF SYSTEMS:  REVIEW OF SYSTEMS:  CONSTITUTIONAL: Denies fevers, chills, positive fatigue, weakness.  EYES: Denies blurred vision, double vision, or eye pain.  EARS, NOSE, THROAT: Denies tinnitus, ear pain, hearing loss.  RESPIRATORY: denies cough, shortness of breath, wheezing  CARDIOVASCULAR: Denies chest pain, palpitations, edema.  GASTROINTESTINAL: Positive nausea, vomiting, denies diarrhea, abdominal pain.  GENITOURINARY: Denies dysuria, hematuria.  ENDOCRINE: Denies nocturia or thyroid problems. HEMATOLOGIC AND LYMPHATIC: Denies easy bruising or  bleeding.  SKIN: Denies rash or lesions.  MUSCULOSKELETAL: Denies pain in neck, back, shoulder, knees, hips, or further arthritic symptoms.  NEUROLOGIC: Denies paralysis, paresthesias.  PSYCHIATRIC: Denies anxiety or depressive symptoms. Otherwise full review of systems performed by me is negative.   MEDICATIONS AT HOME:   Prior to Admission medications   Medication Sig Start Date End Date Taking? Authorizing Provider  acetaminophen (TYLENOL) 650 MG CR tablet Take 650 mg by mouth 3 (three) times daily.     Yes Historical Provider, MD  ALPRAZolam (XANAX) 0.25 MG tablet Take 1 tablet (0.25 mg total) by mouth 2 (two) times daily as needed. Patient taking differently: Take 0.25 mg by mouth 2 (two) times daily.  04/05/15  Yes Nicholes Mango, MD  alum & mag hydroxide-simeth (MAALOX/MYLANTA) 200-200-20 MG/5ML suspension Take 30 mLs by mouth every 4 (four) hours as needed for indigestion, heartburn or flatulence.   Yes Historical Provider, MD  aluminum hydroxide [facility]) ointment Apply 1 application topically as needed (to open area on buttocks).   Yes Historical Provider, MD  Ascorbic Acid (VITAMIN C) 1000 MG tablet Take 1,000 mg by mouth daily.   Yes Historical Provider, MD  Calcium Carbonate-Vitamin D (CALCIUM 600+D) 600-400 MG-UNIT per tablet Take 1 tablet by mouth every evening.   Yes Historical Provider, MD  DULoxetine (CYMBALTA) 60 MG capsule Take 60 mg by mouth daily.    Yes Historical Provider, MD  Emollient (CERAVE) CREA Apply 1 application topically every evening.   Yes Historical Provider, MD  folic acid (FOLVITE) 1 MG tablet Take 1 mg by mouth daily.   Yes Historical Provider, MD  HYDROcodone-acetaminophen (NORCO/VICODIN) 5-325 MG per tablet Take 1 tablet by mouth every 4 (four) hours as needed for moderate pain.   Yes Historical Provider, MD  hydroxychloroquine (PLAQUENIL) 200 MG tablet Take 200 mg by mouth 2 (two) times daily.   Yes Historical Provider, MD  Hypromellose (ARTIFICIAL TEARS OP) Apply 2 drops to eye 2 (two) times daily as needed (for dry eyes).   Yes Historical Provider, MD  loratadine (CLARITIN) 10 MG tablet Take 10 mg by mouth daily.    Yes Historical Provider, MD  metoprolol succinate (TOPROL-XL) 50 MG 24 hr tablet Take 50 mg by mouth 2 (two) times daily.    Yes Historical Provider, MD  mirtazapine (REMERON) 15 MG tablet Take 15 mg by mouth at bedtime.   Yes Historical Provider, MD  montelukast (SINGULAIR) 10 MG tablet Take 10 mg by mouth every evening.   Yes Historical Provider,  MD  Multiple Vitamin (THEREMS) TABS Take 1 tablet by mouth daily.   Yes Historical Provider, MD  omeprazole (PRILOSEC) 20 MG capsule Take 20 mg by mouth daily.   Yes Historical Provider, MD  ondansetron (ZOFRAN) 4 MG tablet Take 4 mg by mouth every 4 (four) hours as needed for nausea or vomiting.    Yes Historical Provider, MD  primidone (MYSOLINE) 50 MG tablet Take 50 mg by mouth 2 (two) times daily.    Yes Historical Provider, MD  sennosides-docusate sodium (SENOKOT-S) 8.6-50 MG tablet Take 1 tablet by mouth at bedtime.   Yes Historical Provider, MD  traZODone (DESYREL) 50 MG tablet Take 50 mg by mouth at bedtime.   Yes Historical Provider, MD  ALPRAZolam (XANAX) 0.25 MG tablet Take 1 tablet (0.25 mg total) by mouth 3 (three) times daily. Patient not taking: Reported on 07/06/2015 04/05/15   Nicholes Mango, MD  feeding supplement, ENSURE ENLIVE, (ENSURE ENLIVE) LIQD Take 237 mLs by  mouth 2 (two) times daily between meals. Patient not taking: Reported on 07/06/2015 04/05/15   Nicholes Mango, MD  traMADol (ULTRAM) 50 MG tablet Take 1 tablet (50 mg total) by mouth every 8 (eight) hours as needed. Patient not taking: Reported on 07/06/2015 04/05/15   Nicholes Mango, MD      VITAL SIGNS:  Blood pressure 111/63, pulse 120, temperature 98.9 F (37.2 C), temperature source Oral, resp. rate 25, height 5\' 3"  (1.6 m), weight 95 lb (43.092 kg), SpO2 74 %.  PHYSICAL EXAMINATION:  VITAL SIGNS: Filed Vitals:   07/06/15 2200  BP: 111/63  Pulse: 120  Temp:   Resp: 80   GENERAL:81 y.o.female currently in no acute distress. Chronically ill-appearing HEAD: Normocephalic, atraumatic.  EYES: Pupils equal, round, reactive to light. Extraocular muscles intact. No scleral icterus.  MOUTH: Dry mucosal membrane. Dentition poor. No abscess noted.  EAR, NOSE, THROAT: Clear without exudates. No external lesions.  NECK: Supple. No thyromegaly. No nodules. No JVD.  PULMONARY: Clear to ascultation, without wheeze rails or  rhonci. No use of accessory muscles, Good respiratory effort. good air entry bilaterally CHEST: Nontender to palpation.  CARDIOVASCULAR: S1 and S2. Regular rate and rhythm. No murmurs, rubs, or gallops. No edema. Pedal pulses 2+ bilaterally.  GASTROINTESTINAL: Soft, nontender, nondistended. No masses. Positive bowel sounds. No hepatosplenomegaly.  MUSCULOSKELETAL: No swelling, clubbing, or edema. Contractures of lower extremities NEUROLOGIC: Cranial nerves II through XII are intact. No gross focal neurological deficits. Sensation intact. Reflexes intact.  SKIN: No ulceration, lesions, rashes, or cyanosis. Skin warm and dry. Turgor intact.  PSYCHIATRIC: Mood, affect within normal limits. The patient is awake, alert and oriented x 3. Insight, judgment intact.    LABORATORY PANEL:   CBC  Recent Labs Lab 07/06/15 1724  WBC 30.8*  HGB 11.1*  HCT 34.1*  PLT 532*   ------------------------------------------------------------------------------------------------------------------  Chemistries   Recent Labs Lab 07/06/15 1724  NA 122*  K 5.6*  CL 85*  CO2 26  GLUCOSE 107*  BUN 49*  CREATININE 0.60  CALCIUM 9.1  AST 26  ALT 9*  ALKPHOS 90  BILITOT 0.4   ------------------------------------------------------------------------------------------------------------------  Cardiac Enzymes No results for input(s): TROPONINI in the last 168 hours. ------------------------------------------------------------------------------------------------------------------  RADIOLOGY:  Ct Abdomen Pelvis W Contrast  07/06/2015   CLINICAL DATA:  Vomiting for 2-3 days, leukocytosis, recurrent UTI, history cerebral palsy, hypertension, neurogenic bladder, chronic cystitis  EXAM: CT ABDOMEN AND PELVIS WITH CONTRAST  TECHNIQUE: Multidetector CT imaging of the abdomen and pelvis was performed using the standard protocol following bolus administration of intravenous contrast.  CONTRAST:  45mL OMNIPAQUE  IOHEXOL 300 MG/ML SOLN IV. No oral contrast administered.  COMPARISON:  04/03/2015  FINDINGS: Eventration of LEFT diaphragm with decreased LEFT basilar atelectasis.  3 mm RIGHT lower lobe nodule image 11, new.  Persistent marked RIGHT hydronephrosis and hydroureter with RIGHT ureteral dilatation extending to very near the ureterovesical junction.  Mild cortical thinning RIGHT kidney.  Tiny nonobstructing calculus inferior pole LEFT kidney without LEFT hydronephrosis or mass.  Scattered atherosclerotic calcifications.  Tiny hepatic cysts.  Liver, spleen, pancreas, kidneys, and adrenal glands otherwise unremarkable.  Mild bladder wall thickening with bladder distention.  Atrophic uterus and adnexa.  Stomach and bowel loops grossly unremarkable for exam lacking GI contrast.  No mass, adenopathy, free air or free fluid.  Marked osseous demineralization with degenerative changes of both hip joints.  Scoliosis and degenerative changes of the lumbar spine with compression deformities at superior L5, L1, and T11 grossly unchanged.  IMPRESSION: Persistent marked RIGHT hydronephrosis and hydroureter terminating at the very distal RIGHT ureter near the ureterovesical junction.  Distended urinary bladder with mild wall thickening.  Chronic atelectasis LEFT lower lobe, slightly improved since previous exam.   Electronically Signed   By: Lavonia Dana M.D.   On: 07/06/2015 21:07   Dg Chest Port 1 View  07/06/2015   CLINICAL DATA:  Vomiting 2-3 days.  EXAM: PORTABLE CHEST - 1 VIEW  COMPARISON:  04/01/2015 and CT 04/03/2015  FINDINGS: Lungs are adequately inflated as there is persistent moderate elevation of the left hemidiaphragm versus diaphragmatic hernia unchanged. Cardiomediastinal silhouette is within normal. There is mild calcified plaque over the aortic arch. There are moderate degenerative changes of the spine and shoulders.  IMPRESSION: No acute cardiopulmonary disease.  Stable moderate elevation of the left  hemidiaphragm versus diaphragmatic hernia.   Electronically Signed   By: Marin Olp M.D.   On: 07/06/2015 19:48    EKG:   Orders placed or performed during the hospital encounter of 07/06/15  . ED EKG  . ED EKG  . EKG 12-Lead  . EKG 12-Lead    IMPRESSION AND PLAN:   81 year old Caucasian female history of cerebral palsy presenting with nausea and vomiting.  1. Sepsis, meeting septic criteria by leukocytosis, heart rate present on arrival. Source urinary tract infection site unspecified  Panculture. Broad-spectrum antibiotics including meropenem given history of ESBL Escherichia coli and taper antibiotics when culture data returns. Continue IV fluid hydration to keep mean arterial pressure greater than 65. He may require pressor therapy if blood pressure worsens. We will repeat lactic acid given the initial is greater than 2.2.   2. Upper GI bleed: Hold anticoagulates antiplatelets, initiate Protonix IV, consult gastroenterology, trend CBCs transfuse if hemoglobin less than 7 3. Hyponatremia: IV fluid hydration normal saline 4. Hyperkalemia: IV fluid hydration and follow potassium levels 5. Venous thromboembolism prophylactic: SCDs given concern for bleeding   All the records are reviewed and case discussed with ED provider. Management plans discussed with the patient, family and they are in agreement.  CODE STATUS: DO NOT RESUSCITATE  TOTAL TIME TAKING CARE OF THIS PATIENT: 45 minutes.    Hower,  Karenann Cai.D on 07/06/2015 at 10:22 PM  Between 7am to 6pm - Pager - [phone]  After 6pm: House Pager: - [place] [facility]  Office  [phone]  CC: Primary care physician; Viviana Simpler, MD

## 2015-07-06 NOTE — ED Notes (Signed)
Pt arrived via EMS from [facility].  Per EMS staff states patient has been vomiting brown emesis for the past 2-3 days.  On arrival patient denies any N/V/D or chest pain.  EMs smelled a strong odor which correlates to patient's history of recurring UTI.  Pt has a history of cerebral palsy and is contracted. Pt reports decreased appetite.

## 2015-07-07 DIAGNOSIS — E43 Unspecified severe protein-calorie malnutrition: Secondary | ICD-10-CM | POA: Insufficient documentation

## 2015-07-07 LAB — BASIC METABOLIC PANEL
Anion gap: 6 (ref 5–15)
BUN: 32 mg/dL — AB (ref 6–20)
CALCIUM: 7.9 mg/dL — AB (ref 8.9–10.3)
CO2: 23 mmol/L (ref 22–32)
CREATININE: 0.4 mg/dL — AB (ref 0.44–1.00)
Chloride: 99 mmol/L — ABNORMAL LOW (ref 101–111)
GFR calc Af Amer: >60 mL/min (ref >60)
Glucose, Bld: 124 mg/dL — ABNORMAL HIGH (ref 65–99)
POTASSIUM: 4 mmol/L (ref 3.5–5.1)
SODIUM: 128 mmol/L — AB (ref 135–145)

## 2015-07-07 LAB — CBC
HEMATOCRIT: 26.7 % — AB (ref 35.0–47.0)
Hemoglobin: 8.7 g/dL — ABNORMAL LOW (ref 12.0–16.0)
MCH: 31.7 pg (ref 26.0–34.0)
MCHC: 32.5 g/dL (ref 32.0–36.0)
MCV: 97.5 fL (ref 80.0–100.0)
PLATELETS: 369 10*3/uL (ref 150–440)
RBC: 2.74 MIL/uL — ABNORMAL LOW (ref 3.80–5.20)
RDW: 14.1 % (ref 11.5–14.5)
WBC: 13.4 10*3/uL — AB (ref 3.6–11.0)

## 2015-07-07 LAB — MRSA PCR SCREENING: MRSA BY PCR: NEGATIVE

## 2015-07-07 MED ORDER — BOOST / RESOURCE BREEZE PO LIQD
1.0000 | Freq: Three times a day (TID) | ORAL | Status: DC
  Administered 2015-07-07 – 2015-07-08 (×4): 1 via ORAL

## 2015-07-07 NOTE — Progress Notes (Signed)
[facility] [place] NAME: Stacey Torres    MR#:  [id number]  DATE OF BIRTH:  01/04/1929  SUBJECTIVE:  CHIEF COMPLAINT:   Chief Complaint  Patient presents with  . Emesis   Admitted with sepsis from UTI, feeling much better. Has a niece who cares for her. From a facility. Labs pending.  REVIEW OF SYSTEMS:  Review of Systems  Constitutional: Negative for fever and chills.  HENT: Negative for hearing loss.   Respiratory: Negative for cough, shortness of breath and wheezing.   Cardiovascular: Negative for chest pain and palpitations.  Gastrointestinal: Negative for nausea, vomiting, abdominal pain, diarrhea and constipation.  Genitourinary: Negative for dysuria and frequency.  Neurological: Negative for dizziness, seizures, weakness and headaches.    DRUG ALLERGIES:   Allergies  Allergen Reactions  . Macrobid [Nitrofurantoin] Other (See Comments)    Reaction:  Unknown     VITALS:  Blood pressure 110/56, pulse 106, temperature 97.9 F (36.6 C), temperature source Oral, resp. rate 20, height 5\' 3"  (1.6 m), weight 43.092 kg (95 lb), SpO2 89 %.  PHYSICAL EXAMINATION:  Physical Exam  GENERAL:  81 y.o.-year-old elderly patient lying in the bed with no acute distress.  EYES: Pupils equal, round, reactive to light and accommodation. No scleral icterus. Extraocular muscles intact.  HEENT: Head atraumatic, normocephalic. Oropharynx and nasopharynx clear.  Edentulous. NECK:  Supple, no jugular venous distention. No thyroid enlargement, no tenderness.  LUNGS: Normal breath sounds bilaterally, no wheezing, rales,rhonchi or crepitation. No use of accessory muscles of respiration.  CARDIOVASCULAR: S1, S2 normal. No  rubs, or gallops. 3/6 systolic murmur heard. ABDOMEN: Soft, nontender, nondistended. Bowel sounds present. No organomegaly or mass.  EXTREMITIES: No pedal edema, cyanosis, or clubbing.  NEUROLOGIC: Cranial nerves II  through XII are intact. Muscle strength 5/5 in all extremities. Sensation intact. Gait not checked.  PSYCHIATRIC: The patient is alert and oriented x 3.  SKIN: No obvious rash, lesion, or ulcer.    LABORATORY PANEL:   CBC  Recent Labs Lab 07/06/15 1724  WBC 30.8*  HGB 11.1*  HCT 34.1*  PLT 532*   ------------------------------------------------------------------------------------------------------------------  Chemistries   Recent Labs Lab 07/06/15 1724  NA 122*  K 5.6*  CL 85*  CO2 26  GLUCOSE 107*  BUN 49*  CREATININE 0.60  CALCIUM 9.1  AST 26  ALT 9*  ALKPHOS 90  BILITOT 0.4   ------------------------------------------------------------------------------------------------------------------  Cardiac Enzymes No results for input(s): TROPONINI in the last 168 hours. ------------------------------------------------------------------------------------------------------------------  RADIOLOGY:  Ct Abdomen Pelvis W Contrast  07/06/2015   CLINICAL DATA:  Vomiting for 2-3 days, leukocytosis, recurrent UTI, history cerebral palsy, hypertension, neurogenic bladder, chronic cystitis  EXAM: CT ABDOMEN AND PELVIS WITH CONTRAST  TECHNIQUE: Multidetector CT imaging of the abdomen and pelvis was performed using the standard protocol following bolus administration of intravenous contrast.  CONTRAST:  46mL OMNIPAQUE IOHEXOL 300 MG/ML SOLN IV. No oral contrast administered.  COMPARISON:  04/03/2015  FINDINGS: Eventration of LEFT diaphragm with decreased LEFT basilar atelectasis.  3 mm RIGHT lower lobe nodule image 11, new.  Persistent marked RIGHT hydronephrosis and hydroureter with RIGHT ureteral dilatation extending to very near the ureterovesical junction.  Mild cortical thinning RIGHT kidney.  Tiny nonobstructing calculus inferior pole LEFT kidney without LEFT hydronephrosis or mass.  Scattered atherosclerotic calcifications.  Tiny hepatic cysts.  Liver, spleen, pancreas, kidneys, and  adrenal glands otherwise unremarkable.  Mild bladder wall thickening with bladder distention.  Atrophic uterus and  adnexa.  Stomach and bowel loops grossly unremarkable for exam lacking GI contrast.  No mass, adenopathy, free air or free fluid.  Marked osseous demineralization with degenerative changes of both hip joints.  Scoliosis and degenerative changes of the lumbar spine with compression deformities at superior L5, L1, and T11 grossly unchanged.  IMPRESSION: Persistent marked RIGHT hydronephrosis and hydroureter terminating at the very distal RIGHT ureter near the ureterovesical junction.  Distended urinary bladder with mild wall thickening.  Chronic atelectasis LEFT lower lobe, slightly improved since previous exam.   Electronically Signed   By: Lavonia Dana M.D.   On: 07/06/2015 21:07   Dg Chest Port 1 View  07/06/2015   CLINICAL DATA:  Vomiting 2-3 days.  EXAM: PORTABLE CHEST - 1 VIEW  COMPARISON:  04/01/2015 and CT 04/03/2015  FINDINGS: Lungs are adequately inflated as there is persistent moderate elevation of the left hemidiaphragm versus diaphragmatic hernia unchanged. Cardiomediastinal silhouette is within normal. There is mild calcified plaque over the aortic arch. There are moderate degenerative changes of the spine and shoulders.  IMPRESSION: No acute cardiopulmonary disease.  Stable moderate elevation of the left hemidiaphragm versus diaphragmatic hernia.   Electronically Signed   By: Marin Olp M.D.   On: 07/06/2015 19:48    EKG:   Orders placed or performed during the hospital encounter of 07/06/15  . ED EKG  . ED EKG  . EKG 12-Lead  . EKG 12-Lead    ASSESSMENT AND PLAN:   81 year old female with known history of cerebral palsy, gait dysfunction presents from [place] secondary to sepsis.  #1 sepsis-secondary to urinary tract infection/acute cystitis -Blood and urine cultures are done -On meropenem. WBC elevated on admission -Afebrile now, follow-up WBC in  a.m. -Hemodynamically stable   #2 hyponatremia-secondary to dehydration. -Continue IV fluids and check labs today.  #3 hyperkalemia-follow-up today  #4 hematemesis-likely from repeated vomiting. resolved now. -Continue Protonix IV for today. Hemoglobin will be checked today. -On liquid diet for now. GI has been consulted.  #5 DVT prophylaxis-Ted's and SCDs.  #6 depression-continue all home medications.  All the records are reviewed and case discussed with Care Management/Social Workerr. Management plans discussed with the patient, family and they are in agreement.  CODE STATUS: DNR  TOTAL TIME TAKING CARE OF THIS PATIENT: 36 minutes.   POSSIBLE D/C IN 2 DAYS, DEPENDING ON CLINICAL CONDITION.   Gladstone Lighter M.D on 07/07/2015 at 4:08 PM  Between 7am to 6pm - Pager - [phone]  After 6pm go to [email or web address] - password [email or web address] [facility]  Office  [phone]  CC: Primary care physician; Viviana Simpler, MD

## 2015-07-07 NOTE — Progress Notes (Signed)
Dr Tressia Miners aware that lab reports drop in HGB 11.1 to 8.7, states that this HGB is more of pts norm

## 2015-07-07 NOTE — Plan of Care (Signed)
Problem: Discharge Progression Outcomes Goal: Other Discharge Outcomes/Goals Outcome: Progressing Plan of Care Progress to Goal:

## 2015-07-07 NOTE — Progress Notes (Signed)
Took over pt care at 1600, pt alert, calm and cooperative with care, turned and repositioned, voices any needs

## 2015-07-07 NOTE — Consult Note (Signed)
GI Inpatient Consult Note  Reason for Consult: dark emesis   Attending Requesting Consult: Hower  History of Present Illness: Stacey Torres is a 81 y.o. female with past medical history notable for cerebral palsy, rheumatoid arthritis who is presenting for evaluation of dark emesis. Stacey Torres is a difficult historian to understand but she does endorse several episodes of dark vomit where she lives. She denies any rectal bleeding or melena and in fact has not had any bowel movements in the past several days. She denies any previous history of dark emesis or GI bleeding. These episodes are not accompanied by abdominal pain, fever or chills. She denies NSAID use.   Here in the hospital, she was started on IV ppi.  She has not had any further dark emesis. Her hemoglobin has been stable and is actually increased from her previous baseline. In eyes any previous history of upper endoscopy.  [facility] she does have a record of a colonoscopy in 2006 and no upper endoscopy.  Past Medical History:  Past Medical History  Diagnosis Date  . Cerebral palsy, hemiplegic     pt is intelligant  . Neurologic gait dysfunction   . Spinal stenosis   . Urge urinary incontinence   . Hypertension   . Arthritis   . Neurogenic bladder   . Hydronephrosis, right   . Chronic cystitis   . Gross hematuria   . RA (rheumatoid arthritis)     advanced  . Flexion contracture joint of multiple sites     secondary to advanced ra  . Slurred speech   . Cancer skin ca  . Seasonal allergies   . UTI (lower urinary tract infection)   . Sepsis due to urinary tract infection   . Cough   . RA (rheumatoid arthritis)   . Edema, lower extremity   . Cataract     Problem List: Patient Active Problem List   Diagnosis Date Noted  . Protein-calorie malnutrition, severe 07/07/2015  . Sepsis 07/06/2015  . Upper GI bleed 07/06/2015  . Hyperkalemia 07/06/2015  . Endometrial thickening on ultra sound   .  Malnutrition of moderate degree 04/02/2015  . Rheumatoid arthritis 04/01/2015  . Essential hypertension 04/01/2015  . Hydronephrosis of right kidney 11/21/2013    Past Surgical History: Past Surgical History  Procedure Laterality Date  . Total knee arthroplasty Right 2005  . Cholecystectomy  1995  . Cystoscopy with retrograde pyelogram, ureteroscopy and stent placement Bilateral 11/21/2013    Procedure: CYSTOSCOPY WITH RIGHT RETROGRADE PYELOGRAM, RIGHT FLEXIBLE and rigid URETEROSCOPY  PLACEMENT;  Surgeon: Bernestine Amass, MD;  Location: [facility];  Service: Urology;  Laterality: Bilateral;  . Cystogram  11/21/2013    Procedure: CYSTOGRAM;  Surgeon: Bernestine Amass, MD;  Location: [facility];  Service: Urology;;  . Dg toes rt foot multiple specif ([facility] hx)    . Hysteroscopy w/d&c N/A 05/09/2015    Procedure: DILATATION AND CURETTAGE Pollyann Glen, Myosure;  Surgeon: Gillis Ends, MD;  Location: [facility];  Service: Gynecology;  Laterality: N/A;    Allergies: Allergies  Allergen Reactions  . Macrobid [Nitrofurantoin] Other (See Comments)    Reaction:  Unknown     Home Medications: Prescriptions prior to admission  Medication Sig Dispense Refill Last Dose  . acetaminophen (TYLENOL) 650 MG CR tablet Take 650 mg by mouth 3 (three) times daily.    07/06/2015 at 1400  . ALPRAZolam (XANAX) 0.25 MG tablet Take 1 tablet (0.25 mg total)  by mouth 2 (two) times daily as needed. (Patient taking differently: Take 0.25 mg by mouth 2 (two) times daily. ) 10 tablet 0 07/06/2015 at Unknown time  . alum & mag hydroxide-simeth (MAALOX/MYLANTA) 200-200-20 MG/5ML suspension Take 30 mLs by mouth every 4 (four) hours as needed for indigestion, heartburn or flatulence.   Past Month at Unknown time  . aluminum hydroxide (DERMAGRAN) ointment Apply 1 application topically as needed (to open area on buttocks).   07/06/2015 at Unknown time  . Ascorbic Acid (VITAMIN C) 1000 MG tablet  Take 1,000 mg by mouth daily.   07/06/2015 at Unknown time  . Calcium Carbonate-Vitamin D (CALCIUM 600+D) 600-400 MG-UNIT per tablet Take 1 tablet by mouth every evening.   07/05/2015 at Unknown time  . DULoxetine (CYMBALTA) 60 MG capsule Take 60 mg by mouth daily.    07/06/2015 at Unknown time  . Emollient (CERAVE) CREA Apply 1 application topically every evening.   07/05/2015 at Unknown time  . folic acid (FOLVITE) 1 MG tablet Take 1 mg by mouth daily.   07/06/2015 at Unknown time  . HYDROcodone-acetaminophen (NORCO/VICODIN) 5-325 MG per tablet Take 1 tablet by mouth every 4 (four) hours as needed for moderate pain.   Past Month at Unknown time  . hydroxychloroquine (PLAQUENIL) 200 MG tablet Take 200 mg by mouth 2 (two) times daily.   07/06/2015 at Unknown time  . Hypromellose (ARTIFICIAL TEARS OP) Apply 2 drops to eye 2 (two) times daily as needed (for dry eyes).   PRN at PRN  . loratadine (CLARITIN) 10 MG tablet Take 10 mg by mouth daily.    07/06/2015 at Unknown time  . metoprolol succinate (TOPROL-XL) 50 MG 24 hr tablet Take 50 mg by mouth 2 (two) times daily.    07/06/2015 at 0800  . mirtazapine (REMERON) 15 MG tablet Take 15 mg by mouth at bedtime.   07/05/2015 at Unknown time  . montelukast (SINGULAIR) 10 MG tablet Take 10 mg by mouth every evening.   07/05/2015 at Unknown time  . Multiple Vitamin (THEREMS) TABS Take 1 tablet by mouth daily.   07/06/2015 at Unknown time  . omeprazole (PRILOSEC) 20 MG capsule Take 20 mg by mouth daily.   07/06/2015 at Unknown time  . ondansetron (ZOFRAN) 4 MG tablet Take 4 mg by mouth every 4 (four) hours as needed for nausea or vomiting.    07/06/2015 at Unknown time  . primidone (MYSOLINE) 50 MG tablet Take 50 mg by mouth 2 (two) times daily.    07/06/2015 at Unknown time  . sennosides-docusate sodium (SENOKOT-S) 8.6-50 MG tablet Take 1 tablet by mouth at bedtime.   07/05/2015 at Unknown time  . traZODone (DESYREL) 50 MG tablet Take 50 mg by mouth at bedtime.   07/05/2015  at Unknown time  . ALPRAZolam (XANAX) 0.25 MG tablet Take 1 tablet (0.25 mg total) by mouth 3 (three) times daily. (Patient not taking: Reported on 07/06/2015) 30 tablet 0 Taking  . feeding supplement, ENSURE ENLIVE, (ENSURE ENLIVE) LIQD Take 237 mLs by mouth 2 (two) times daily between meals. (Patient not taking: Reported on 07/06/2015) 237 mL 12 Taking  . traMADol (ULTRAM) 50 MG tablet Take 1 tablet (50 mg total) by mouth every 8 (eight) hours as needed. (Patient not taking: Reported on 07/06/2015) 15 tablet 0 Taking      Scheduled Inpatient Medications:   . calcium-vitamin D  1 tablet Oral QPM  . DULoxetine  60 mg Oral Daily  . feeding supplement  1 Container Oral TID WC  . folic acid  1 mg Oral Daily  . hydrocerin   Topical QPM  . hydroxychloroquine  200 mg Oral BID  . loratadine  10 mg Oral Daily  . meropenem (MERREM) IV  1 g Intravenous Q12H  . metoprolol succinate  50 mg Oral BID  . mirtazapine  15 mg Oral QHS  . montelukast  10 mg Oral QPM  . pantoprazole (PROTONIX) IV  40 mg Intravenous Q12H  . primidone  50 mg Oral BID  . senna-docusate  1 tablet Oral QHS  . traZODone  50 mg Oral QHS    Continuous Inpatient Infusions:   . sodium chloride 75 mL/hr at 07/07/15 1426    PRN Inpatient Medications:  acetaminophen **OR** acetaminophen, ALPRAZolam, alum & mag hydroxide-simeth, artificial tears, morphine injection, ondansetron **OR** ondansetron (ZOFRAN) IV, oxyCODONE  Family History: family history includes Breast cancer in her sister; CAD in her mother; Deep vein thrombosis in her father.    Social History:   reports that she quit smoking about 56 years ago. She has never used smokeless tobacco. She reports that she does not drink alcohol or use illicit drugs.  Review of Systems: Constitutional: Weight is stable.  Eyes: No changes in vision. ENT: No oral lesions, sore throat.  GI: see HPI.  Heme/Lymph: + easy brusing CV: No chest pain.  GU: No hematuria.  Integumentary:  No rashes.  Neuro: No headaches.  Psych: No depression/anxiety.  Endocrine: No heat/cold intolerance.  Allergic/Immunologic: No urticaria.  Resp: No cough, SOB.  Musculoskeletal: No joint swelling.    Physical Examination: BP 110/56 mmHg  Pulse 106  Temp(Src) 97.9 F (36.6 C) (Oral)  Resp 20  Ht 5\' 3"  (1.6 m)  Wt 43.092 kg (95 lb)  BMI 16.83 kg/m2  SpO2 89% Gen: NAD, alert and oriented x 2 HEENT: poor dentition Neck: supple, no JVD or thyromegaly Chest: CTA bilaterally, no wheezes, crackles, or other adventitious sounds CV: RRR, no m/g/c/r Abd: soft, NT, ND, +BS in all four quadrants; no HSM, guarding, ridigity, or rebound tenderness Ext: no edema, well perfused with 2+ pulses, Skin: no rash or lesions noted Lymph: no LAD  Data: Lab Results  Component Value Date   WBC 30.8* 07/06/2015   HGB 11.1* 07/06/2015   HCT 34.1* 07/06/2015   MCV 95.6 07/06/2015   PLT 532* 07/06/2015    Recent Labs Lab 07/06/15 1724  HGB 11.1*   Lab Results  Component Value Date   NA 122* 07/06/2015   K 5.6* 07/06/2015   CL 85* 07/06/2015   CO2 26 07/06/2015   BUN 49* 07/06/2015   CREATININE 0.60 07/06/2015   Lab Results  Component Value Date   ALT 9* 07/06/2015   AST 26 07/06/2015   ALKPHOS 90 07/06/2015   BILITOT 0.4 07/06/2015   No results for input(s): APTT, INR, PTT in the last 168 hours.   Assessment/Plan: Stacey Torres is a 81 y.o. female presenting with possible coffee-ground emesis. Her hemoglobin is actually up from her previous baseline and she has not had any stool. Both of these would suggest that if she did have blood causing dark emesis, it was a very small amount of bleeding. Given her age, severe comorbidities and poor functional status, along with factors pointing to a minimal amount of blood loss, it is unlikely that the benefits of an upper endoscopy would outweigh the risks.  Reserve upper endoscopy for evidence of significant upper GI bleeding with hemodynamic  changes  and drop in hemoglobin.   Recommendations: - ok to change PPI to PO BID for 1 month, then daily.  - check h pylori Igg - avoid nsaids - no plans for EGD unless evidence of significant upper GI bleeding with hemodynamic changes and drop in hemoglobin.   Thank you for the consult. Please call with questions or concerns.  Flavius Repsher, Grace Blight, MD

## 2015-07-07 NOTE — Discharge Planning (Signed)
Pt continued on IV antibiotics and saline 75 continued NS.

## 2015-07-07 NOTE — Plan of Care (Addendum)
Problem: Discharge Progression Outcomes Goal: Other Discharge Outcomes/Goals Outcome: Progressing Plan of Care Progress to Goal:   Pt report pain x2 during shift. Pt is contracted. Pt has a scabbed area on left butt cheek. Pt is incontinent. No other signs of distress noted. Will continue to monitor.

## 2015-07-07 NOTE — Progress Notes (Signed)
Initial Nutrition Assessment  DOCUMENTATION CODES:   Severe malnutrition in context of chronic illness  INTERVENTION:  Meals and snacks: Cater to pt preferences Nutrition Supplement therapy: Recommend Boost Breeze po TID, each supplement provides 250 kcal and 9 grams of protein    NUTRITION DIAGNOSIS:   Inadequate oral intake related to poor appetite as evidenced by per patient/family report, percent weight loss, severe depletion of body fat, moderate depletion of body fat, severe depletion of muscle mass, moderate depletions of muscle mass.    GOAL:   Patient will meet greater than or equal to 90% of their needs    MONITOR:    (Energy intake, Digestive system)  REASON FOR ASSESSMENT:   Malnutrition Screening Tool    ASSESSMENT:      Pt admitted with emesis, UTI Past Medical History  Diagnosis Date  . Cerebral palsy, hemiplegic     pt is intelligant  . Neurologic gait dysfunction   . Spinal stenosis   . Urge urinary incontinence   . Hypertension   . Arthritis   . Neurogenic bladder   . Hydronephrosis, right   . Chronic cystitis   . Gross hematuria   . RA (rheumatoid arthritis)     advanced  . Flexion contracture joint of multiple sites     secondary to advanced ra  . Slurred speech   . Cancer skin ca  . Seasonal allergies   . UTI (lower urinary tract infection)   . Sepsis due to urinary tract infection   . Cough   . RA (rheumatoid arthritis)   . Edema, lower extremity   . Cataract     Current Nutrition: taking sips of liquids   Food/Nutrition-Related History: reports poor po intake, eating bites for the past month secondary to not feeling well   Medications: NS at 02OV/ZC, folic acid, remeron, senokot  Electrolyte/Renal Profile and Glucose Profile:   Recent Labs Lab 07/06/15 1724  NA 122*  K 5.6*  CL 85*  CO2 26  BUN 49*  CREATININE 0.60  CALCIUM 9.1  GLUCOSE 107*   Protein Profile:  Recent Labs Lab 07/06/15 1724  ALBUMIN 2.7*      Nutrition-Focused Physical Exam Findings: Nutrition-Focused physical exam completed. Findings are moderate to severe fat depletion, moderate to severe muscle depletion, and unable to assess edema.     Weight Change: 16% weight loss in the last 3 months    Diet Order:  Diet clear liquid Room service appropriate?: Yes; Fluid consistency:: Thin  Skin:   reviewed   Height:   Ht Readings from Last 1 Encounters:  07/06/15 5\' 3"  (1.6 m)    Weight:   Wt Readings from Last 1 Encounters:  07/06/15 95 lb (43.092 kg)      BMI:  Body mass index is 16.83 kg/(m^2).  Estimated Nutritional Needs:   Kcal:  BEE 944 kcals (IF 1.1-1.3, AF 1.2) 5885-0277 kcals/d.   Protein:  (1.1-1.3 g/kg) 57-68 g/d  Fluid:  (30-38ml/kg) 1560-1871ml/d  EDUCATION NEEDS:   No education needs identified at this time  HIGH Care Level  Kenwood Rosiak B. Zenia Resides, [place], [place] (pager)

## 2015-07-08 LAB — CBC
HCT: 29.8 % — ABNORMAL LOW (ref 35.0–47.0)
HEMOGLOBIN: 9.7 g/dL — AB (ref 12.0–16.0)
MCH: 31.9 pg (ref 26.0–34.0)
MCHC: 32.5 g/dL (ref 32.0–36.0)
MCV: 98.4 fL (ref 80.0–100.0)
Platelets: 362 10*3/uL (ref 150–440)
RBC: 3.03 MIL/uL — ABNORMAL LOW (ref 3.80–5.20)
RDW: 14 % (ref 11.5–14.5)
WBC: 10.3 10*3/uL (ref 3.6–11.0)

## 2015-07-08 LAB — BASIC METABOLIC PANEL
ANION GAP: 7 (ref 5–15)
BUN: 17 mg/dL (ref 6–20)
CALCIUM: 8.4 mg/dL — AB (ref 8.9–10.3)
CHLORIDE: 104 mmol/L (ref 101–111)
CO2: 23 mmol/L (ref 22–32)
CREATININE: 0.38 mg/dL — AB (ref 0.44–1.00)
GFR calc non Af Amer: >60 mL/min (ref >60)
Glucose, Bld: 77 mg/dL (ref 65–99)
Potassium: 3.9 mmol/L (ref 3.5–5.1)
SODIUM: 134 mmol/L — AB (ref 135–145)

## 2015-07-08 MED ORDER — OMEPRAZOLE 20 MG PO CPDR: 20.0000 mg | DELAYED_RELEASE_CAPSULE | Freq: Every day | ORAL | Status: AC

## 2015-07-08 MED ORDER — LEVOFLOXACIN 250 MG PO TABS: 250.0000 mg | ORAL_TABLET | Freq: Every day | ORAL | Status: DC

## 2015-07-08 NOTE — Progress Notes (Signed)
A&O. VSS. Tolerating soft diet. No complaints of pain or nausea. Rested comfortably throughout shift. H/O cerebral palsy, lower extremities contracted. Incontinent of urine and stool. Discharged per MD orders. IV's removed per policy. Report called to Colletta Maryland, Therapist, sports at [facility]. Belongings gathered. EMS called for transport. Awaiting EMS to arrive.

## 2015-07-08 NOTE — Plan of Care (Signed)
Problem: Discharge Progression Outcomes Goal: Other Discharge Outcomes/Goals Outcome: Progressing Plan of Care Progress to Goal:   Pt is alert and has been resting comfortably during shift. Pt denies pain. SR on the monitor. IV fluids still going. No other signs of distress noted. Will continue to monitor.

## 2015-07-08 NOTE — Progress Notes (Signed)
CSW informed by MD that patient is medically ready to discharge back to [facility].  Per RN patient's niece was at bedside and talked with MD.  Patient and niece in agreement with patient returning to [facility].    Call to [facility] to inform them that patient will be returning.   Patient will go to room 325, call report to [place] at [phone].  CSW faxed signed FL2 and discharge summary with Rx to [facility].  Discharge packet completed.  RN will call report and patient will be transported via EMS.  CSW signing off, no additional CSW services needed at this time.  Casimer Lanius. Latanya Presser, MSW Clinical Social Work Department Emergency Room [phone] 12:42 PM

## 2015-07-08 NOTE — Clinical Social Work Note (Addendum)
Clinical Social Work Assessment  Patient Details  Name: Stacey Torres MRN: [id number] Date of Birth: 01-12-1929  Date of referral:  07/08/15               Reason for consult:   (from [facility]  long-term care)                Permission sought to share information with:  Facility Sport and exercise psychologist, Family Supports Permission granted to share information::  Yes, Verbal Permission Granted  Name::      Stacey Torres)  Agency::   [facility])  Relationship::   (niece)  Contact Information:   Stacey Torres [phone] hm  [phone] cell)  Housing/Transportation Living arrangements for the past 2 months:  [place] Information:  Patient Patient Interpreter Needed:  None Criminal Activity/Legal Involvement Pertinent to Current Situation/Hospitalization:  No - Comment as needed Significant Relationships:  Other Family Members Lives with:  Facility Resident Do you feel safe going back to the place where you live?  Yes Need for family participation in patient care:  Yes (Comment)  Care giving concerns:  No concerns reported at this time.   Social Worker assessment / plan:   Patient is an 81 year old female that resides at [facility].  Patient is pleasant and engaged in conversation with CSW.  Her speech is often difficult to understand at time however she communicates well considering she has very few teeth.  Patient states she has never been married or have any children.  Her POA is her niece Stacey Torres.  Per patient she really likes [facility] states she has lived there for the past 3 years and plans to return when she is discharged from the hospital.   When at [facility] patient states, she uses a motorized wheel chair to move around.  She enjoys sitting outside reading when the weather is nice.    CSW will continue to follow patient for ongoing and discharge needs.  Will completed FL2 and place on chart in anticipation of discharge back to [facility].   Employment  status:  Retired Programmer, applications ) Forensic scientist:  Medicare, [place] PT Recommendations:  Not assessed at this time Information / Referral to community resources:   (none at this time)  Patient/Family's Response to care:  Patient is in agreement to return back to [facility] when she is medically stable.   Patient/Family's Understanding of and Emotional Response to Diagnosis, Current Treatment, and Prognosis:  Patient understands she is currently receiving ongoing medical work up and verbalized that she wishes to discharged back to [facility] when she is medically stable.   Emotional Assessment Appearance:  Appears stated age Attitude/Demeanor/Rapport:   (cooperative) Affect (typically observed):  Appropriate, Calm, Pleasant Orientation:   self, time and situation.  Alcohol / Substance use:    Psych involvement (Current and /or in the community):  No (Comment) (none reported)  Discharge Needs  Concerns to be addressed:  Denies Needs/Concerns at this time Readmission within the last 30 days:  No Current discharge risk:  Chronically ill, Dependent with Mobility Barriers to Discharge:  No Barriers Identified   Maurine Cane, LCSW 07/08/2015, 10:19 AM Casimer Lanius. Latanya Presser, MSW Clinical Social Work Department Emergency Room [phone] 10:30 AM

## 2015-07-08 NOTE — Progress Notes (Signed)
EMS present for pt discharge; pt discharged via stretcher by EMS personnel to [facility]

## 2015-07-08 NOTE — Discharge Summary (Signed)
[facility] NAME: Stacey Torres    MR#:  [id number]  DATE OF BIRTH:  31-Jul-1929  DATE OF ADMISSION:  07/06/2015 ADMITTING PHYSICIAN: Lytle Butte, MD  DATE OF DISCHARGE: 07/08/2015  PRIMARY CARE PHYSICIAN: Viviana Simpler, MD    ADMISSION DIAGNOSIS:  Hyponatremia [E87.1] Sepsis due to urinary tract infection [A41.9, N39.0] Cystitis [N30.90]  DISCHARGE DIAGNOSIS:  Active Problems:   Sepsis   Upper GI bleed   Hyperkalemia   Protein-calorie malnutrition, severe   SECONDARY DIAGNOSIS:   Past Medical History  Diagnosis Date  . Cerebral palsy, hemiplegic     pt is intelligant  . Neurologic gait dysfunction   . Spinal stenosis   . Urge urinary incontinence   . Hypertension   . Arthritis   . Neurogenic bladder   . Hydronephrosis, right   . Chronic cystitis   . Gross hematuria   . RA (rheumatoid arthritis)     advanced  . Flexion contracture joint of multiple sites     secondary to advanced ra  . Slurred speech   . Cancer skin ca  . Seasonal allergies   . UTI (lower urinary tract infection)   . Sepsis due to urinary tract infection   . Cough   . RA (rheumatoid arthritis)   . Edema, lower extremity   . Cataract     HOSPITAL COURSE:   81 year old female with known history of cerebral palsy, gait dysfunction presents from [facility] [place] secondary to sepsis.  #1 sepsis-secondary to urinary tract infection/acute cystitis -Blood and urine cultures are still pending - clinical improvement noted. Patient has had previous UTI's, -On meropenem. WBC much improved -change to levaquin at discharge -Hemodynamically stable   #2 hyponatremia-secondary to dehydration. -received IV fluids and normal sodium today.  #3 hyperkalemia-resolved today  #4 hematemesis-likely from repeated vomiting. resolved now. -Received Protonix IV here. Hemoglobin stable. A drop noted from 11- 8 on adm, I think the 11 was a spurious  value as patients Hb always had been around 8. - No active bleeding - Increase her prilosec to BID at discharge. -Advance to regular diet. GI has been consulted.  #5 depression-continue all home medications.  DISCHARGE CONDITIONS:   Stable  CONSULTS OBTAINED:  Treatment Team:  Josefine Class, MD  DRUG ALLERGIES:   Allergies  Allergen Reactions  . Macrobid [Nitrofurantoin] Other (See Comments)    Reaction:  Unknown     DISCHARGE MEDICATIONS:   Current Discharge Medication List    START taking these medications   Details  levofloxacin (LEVAQUIN) 250 MG tablet Take 1 tablet (250 mg total) by mouth daily. Qty: 5 tablet, Refills: 0      CONTINUE these medications which have CHANGED   Details  omeprazole (PRILOSEC) 20 MG capsule Take 1 capsule (20 mg total) by mouth daily. Qty: 60 capsule, Refills: 0      CONTINUE these medications which have NOT CHANGED   Details  acetaminophen (TYLENOL) 650 MG CR tablet Take 650 mg by mouth 3 (three) times daily.     ALPRAZolam (XANAX) 0.25 MG tablet Take 1 tablet (0.25 mg total) by mouth 2 (two) times daily as needed. Qty: 10 tablet, Refills: 0    alum & mag hydroxide-simeth (MAALOX/MYLANTA) 200-200-20 MG/5ML suspension Take 30 mLs by mouth every 4 (four) hours as needed for indigestion, heartburn or flatulence.    aluminum hydroxide (DERMAGRAN) ointment Apply 1 application topically as needed (to open area on buttocks).  Ascorbic Acid (VITAMIN C) 1000 MG tablet Take 1,000 mg by mouth daily.    Calcium Carbonate-Vitamin D (CALCIUM 600+D) 600-400 MG-UNIT per tablet Take 1 tablet by mouth every evening.    DULoxetine (CYMBALTA) 60 MG capsule Take 60 mg by mouth daily.     Emollient (CERAVE) CREA Apply 1 application topically every evening.    folic acid (FOLVITE) 1 MG tablet Take 1 mg by mouth daily.    HYDROcodone-acetaminophen (NORCO/VICODIN) 5-325 MG per tablet Take 1 tablet by mouth every 4 (four) hours as needed for  moderate pain.    hydroxychloroquine (PLAQUENIL) 200 MG tablet Take 200 mg by mouth 2 (two) times daily.    Hypromellose (ARTIFICIAL TEARS OP) Apply 2 drops to eye 2 (two) times daily as needed (for dry eyes).    loratadine (CLARITIN) 10 MG tablet Take 10 mg by mouth daily.     metoprolol succinate (TOPROL-XL) 50 MG 24 hr tablet Take 50 mg by mouth 2 (two) times daily.     mirtazapine (REMERON) 15 MG tablet Take 15 mg by mouth at bedtime.    montelukast (SINGULAIR) 10 MG tablet Take 10 mg by mouth every evening.    Multiple Vitamin (THEREMS) TABS Take 1 tablet by mouth daily.    ondansetron (ZOFRAN) 4 MG tablet Take 4 mg by mouth every 4 (four) hours as needed for nausea or vomiting.     primidone (MYSOLINE) 50 MG tablet Take 50 mg by mouth 2 (two) times daily.     sennosides-docusate sodium (SENOKOT-S) 8.6-50 MG tablet Take 1 tablet by mouth at bedtime.    traZODone (DESYREL) 50 MG tablet Take 50 mg by mouth at bedtime.    feeding supplement, ENSURE ENLIVE, (ENSURE ENLIVE) LIQD Take 237 mLs by mouth 2 (two) times daily between meals. Qty: 237 mL, Refills: 12      STOP taking these medications     traMADol (ULTRAM) 50 MG tablet          DISCHARGE INSTRUCTIONS:   1. PCP f/u in 1 week  If you experience worsening of your admission symptoms, develop shortness of breath, life threatening emergency, suicidal or homicidal thoughts you must seek medical attention immediately by calling 911 or calling your MD immediately  if symptoms less severe.  You Must read complete instructions/literature along with all the possible adverse reactions/side effects for all the Medicines you take and that have been prescribed to you. Take any new Medicines after you have completely understood and accept all the possible adverse reactions/side effects.   Please note  You were cared for by a hospitalist during your hospital stay. If you have any questions about your discharge medications or the  care you received while you were in the hospital after you are discharged, you can call the unit and asked to speak with the hospitalist on call if the hospitalist that took care of you is not available. Once you are discharged, your primary care physician will handle any further medical issues. Please note that NO REFILLS for any discharge medications will be authorized once you are discharged, as it is imperative that you return to your primary care physician (or establish a relationship with a primary care physician if you do not have one) for your aftercare needs so that they can reassess your need for medications and monitor your lab values.    Today   CHIEF COMPLAINT:   Chief Complaint  Patient presents with  . Emesis    VITAL SIGNS:  Blood pressure 105/51, pulse 94, temperature 98.8 F (37.1 C), temperature source Oral, resp. rate 16, height 5\' 3"  (1.6 m), weight 43.092 kg (95 lb), SpO2 100 %.  I/O:   Intake/Output Summary (Last 24 hours) at 07/08/15 1200 Last data filed at 07/08/15 0908  Gross per 24 hour  Intake    720 ml  Output      0 ml  Net    720 ml    PHYSICAL EXAMINATION:   Physical Exam  GENERAL: 81 y.o.-year-old elderly patient lying in the bed with no acute distress.  EYES: Pupils equal, round, reactive to light and accommodation. No scleral icterus. Extraocular muscles intact.  HEENT: Head atraumatic, normocephalic. Oropharynx and nasopharynx clear.  Edentulous. NECK: Supple, no jugular venous distention. No thyroid enlargement, no tenderness.  LUNGS: Normal breath sounds bilaterally, no wheezing, rales,rhonchi or crepitation. No use of accessory muscles of respiration.  CARDIOVASCULAR: S1, S2 normal. No rubs, or gallops. 3/6 systolic murmur heard. ABDOMEN: Soft, nontender, nondistended. Bowel sounds present. No organomegaly or mass.  EXTREMITIES: No pedal edema, cyanosis, or clubbing.  NEUROLOGIC: Cranial nerves II through XII are intact. Muscle  strength 5/5 in all extremities. Sensation intact. Gait not checked.  PSYCHIATRIC: The patient is alert and oriented x 3.  SKIN: No obvious rash, lesion, or ulcer.   DATA REVIEW:   CBC  Recent Labs Lab 07/08/15 0620  WBC 10.3  HGB 9.7*  HCT 29.8*  PLT 362    Chemistries   Recent Labs Lab 07/06/15 1724  07/08/15 0620  NA 122*  < > 134*  K 5.6*  < > 3.9  CL 85*  < > 104  CO2 26  < > 23  GLUCOSE 107*  < > 77  BUN 49*  < > 17  CREATININE 0.60  < > 0.38*  CALCIUM 9.1  < > 8.4*  AST 26  --   --   ALT 9*  --   --   ALKPHOS 90  --   --   BILITOT 0.4  --   --   < > = values in this interval not displayed.  Cardiac Enzymes No results for input(s): TROPONINI in the last 168 hours.  Microbiology Results  Results for orders placed or performed during the hospital encounter of 07/06/15  Culture, blood (routine x 2)     Status: None (Preliminary result)   Collection Time: 07/06/15 10:59 PM  Result Value Ref Range Status   Specimen Description BLOOD RIGHT ASSIST CONTROL  Final   Special Requests BOTTLES DRAWN AEROBIC AND ANAEROBIC 5CC  Final   Culture NO GROWTH < 12 HOURS  Final   Report Status PENDING  Incomplete  Culture, blood (routine x 2)     Status: None (Preliminary result)   Collection Time: 07/06/15 11:01 PM  Result Value Ref Range Status   Specimen Description BLOOD RIGHT HAND  Final   Special Requests BOTTLES DRAWN AEROBIC AND ANAEROBIC 6CC  Final   Culture NO GROWTH < 12 HOURS  Final   Report Status PENDING  Incomplete  MRSA PCR Screening     Status: None   Collection Time: 07/07/15 12:45 AM  Result Value Ref Range Status   MRSA by PCR NEGATIVE NEGATIVE Final    Comment:        The GeneXpert MRSA Assay (FDA approved for NASAL specimens only), is one component of a comprehensive MRSA colonization surveillance program. It is not intended to diagnose MRSA infection nor to guide or  monitor treatment for MRSA infections.     RADIOLOGY:  Ct Abdomen  Pelvis W Contrast  07/06/2015   CLINICAL DATA:  Vomiting for 2-3 days, leukocytosis, recurrent UTI, history cerebral palsy, hypertension, neurogenic bladder, chronic cystitis  EXAM: CT ABDOMEN AND PELVIS WITH CONTRAST  TECHNIQUE: Multidetector CT imaging of the abdomen and pelvis was performed using the standard protocol following bolus administration of intravenous contrast.  CONTRAST:  26mL OMNIPAQUE IOHEXOL 300 MG/ML SOLN IV. No oral contrast administered.  COMPARISON:  04/03/2015  FINDINGS: Eventration of LEFT diaphragm with decreased LEFT basilar atelectasis.  3 mm RIGHT lower lobe nodule image 11, new.  Persistent marked RIGHT hydronephrosis and hydroureter with RIGHT ureteral dilatation extending to very near the ureterovesical junction.  Mild cortical thinning RIGHT kidney.  Tiny nonobstructing calculus inferior pole LEFT kidney without LEFT hydronephrosis or mass.  Scattered atherosclerotic calcifications.  Tiny hepatic cysts.  Liver, spleen, pancreas, kidneys, and adrenal glands otherwise unremarkable.  Mild bladder wall thickening with bladder distention.  Atrophic uterus and adnexa.  Stomach and bowel loops grossly unremarkable for exam lacking GI contrast.  No mass, adenopathy, free air or free fluid.  Marked osseous demineralization with degenerative changes of both hip joints.  Scoliosis and degenerative changes of the lumbar spine with compression deformities at superior L5, L1, and T11 grossly unchanged.  IMPRESSION: Persistent marked RIGHT hydronephrosis and hydroureter terminating at the very distal RIGHT ureter near the ureterovesical junction.  Distended urinary bladder with mild wall thickening.  Chronic atelectasis LEFT lower lobe, slightly improved since previous exam.   Electronically Signed   By: Lavonia Dana M.D.   On: 07/06/2015 21:07   Dg Chest Port 1 View  07/06/2015   CLINICAL DATA:  Vomiting 2-3 days.  EXAM: PORTABLE CHEST - 1 VIEW  COMPARISON:  04/01/2015 and CT 04/03/2015   FINDINGS: Lungs are adequately inflated as there is persistent moderate elevation of the left hemidiaphragm versus diaphragmatic hernia unchanged. Cardiomediastinal silhouette is within normal. There is mild calcified plaque over the aortic arch. There are moderate degenerative changes of the spine and shoulders.  IMPRESSION: No acute cardiopulmonary disease.  Stable moderate elevation of the left hemidiaphragm versus diaphragmatic hernia.   Electronically Signed   By: Marin Olp M.D.   On: 07/06/2015 19:48    EKG:   Orders placed or performed during the hospital encounter of 07/06/15  . ED EKG  . ED EKG  . EKG 12-Lead  . EKG 12-Lead      Management plans discussed with the patient, family and they are in agreement.  CODE STATUS:     Code Status Orders        Start     Ordered   07/06/15 2204  Do not attempt resuscitation (DNR)   Continuous    Question Answer Comment  In the event of cardiac or respiratory ARREST Do not call a "code blue"   In the event of cardiac or respiratory ARREST Do not perform Intubation, CPR, defibrillation or ACLS   In the event of cardiac or respiratory ARREST Use medication by any route, position, wound care, and other measures to relive pain and suffering. May use oxygen, suction and manual treatment of airway obstruction as needed for comfort.      07/06/15 2203    Advance Directive Documentation        Most Recent Value   Type of Advance Directive  Healthcare Power of Attorney, Living will   Pre-existing out of facility DNR order (yellow  form or pink MOST form)     "MOST" Form in Place?        TOTAL TIME TAKING CARE OF THIS PATIENT: 38 minutes.    Darleth Eustache M.D on 07/08/2015 at 12:00 PM  Between 7am to 6pm - Pager - [phone]  After 6pm go to [email or web address] - password [email or web address] [facility]  Office  [phone]  CC: Primary care physician; Viviana Simpler, MD

## 2015-07-09 DIAGNOSIS — A419 Sepsis, unspecified organism: Secondary | ICD-10-CM

## 2015-07-11 LAB — CULTURE, BLOOD (ROUTINE X 2)
CULTURE: NO GROWTH
Culture: NO GROWTH

## 2015-07-13 LAB — URINE CULTURE: Culture: >=100000

## 2015-07-19 DIAGNOSIS — N390 Urinary tract infection, site not specified: Secondary | ICD-10-CM

## 2015-07-19 DIAGNOSIS — I480 Paroxysmal atrial fibrillation: Secondary | ICD-10-CM

## 2015-07-19 DIAGNOSIS — M069 Rheumatoid arthritis, unspecified: Secondary | ICD-10-CM

## 2015-07-19 DIAGNOSIS — E440 Moderate protein-calorie malnutrition: Secondary | ICD-10-CM

## 2015-07-19 DIAGNOSIS — F411 Generalized anxiety disorder: Secondary | ICD-10-CM

## 2015-09-18 ENCOUNTER — Encounter: Payer: Self-pay | Admitting: *Deleted

## 2015-09-18 NOTE — OR Nursing (Signed)
Cleared by Dr Silvio Pate

## 2015-09-19 DIAGNOSIS — F419 Anxiety disorder, unspecified: Secondary | ICD-10-CM

## 2015-09-19 DIAGNOSIS — M0580 Other rheumatoid arthritis with rheumatoid factor of unspecified site: Secondary | ICD-10-CM

## 2015-09-19 DIAGNOSIS — K219 Gastro-esophageal reflux disease without esophagitis: Secondary | ICD-10-CM

## 2015-09-19 DIAGNOSIS — E43 Unspecified severe protein-calorie malnutrition: Secondary | ICD-10-CM

## 2015-09-19 DIAGNOSIS — R251 Tremor, unspecified: Secondary | ICD-10-CM

## 2015-09-19 DIAGNOSIS — I4891 Unspecified atrial fibrillation: Secondary | ICD-10-CM | POA: Diagnosis not present

## 2015-09-19 DIAGNOSIS — I10 Essential (primary) hypertension: Secondary | ICD-10-CM | POA: Diagnosis not present

## 2015-09-19 DIAGNOSIS — N390 Urinary tract infection, site not specified: Secondary | ICD-10-CM | POA: Diagnosis not present

## 2015-09-24 ENCOUNTER — Ambulatory Visit: Payer: Medicare Other | Admitting: Anesthesiology

## 2015-09-24 ENCOUNTER — Encounter: Payer: Self-pay | Admitting: *Deleted

## 2015-09-24 ENCOUNTER — Ambulatory Visit
Admission: RE | Admit: 2015-09-24 | Discharge: 2015-09-24 | Disposition: A | Discharge status: Home / Self Care | Payer: Medicare Other | Source: Ambulatory Visit | Attending: Ophthalmology | Admitting: Ophthalmology

## 2015-09-24 ENCOUNTER — Encounter
Admission: RE | Disposition: A | Discharge status: Home / Self Care | Payer: Self-pay | Source: Ambulatory Visit | Attending: Ophthalmology

## 2015-09-24 DIAGNOSIS — M4800 Spinal stenosis, site unspecified: Secondary | ICD-10-CM | POA: Insufficient documentation

## 2015-09-24 DIAGNOSIS — J449 Chronic obstructive pulmonary disease, unspecified: Secondary | ICD-10-CM | POA: Diagnosis not present

## 2015-09-24 DIAGNOSIS — Z85828 Personal history of other malignant neoplasm of skin: Secondary | ICD-10-CM | POA: Diagnosis not present

## 2015-09-24 DIAGNOSIS — Z96659 Presence of unspecified artificial knee joint: Secondary | ICD-10-CM | POA: Diagnosis not present

## 2015-09-24 DIAGNOSIS — Z86718 Personal history of other venous thrombosis and embolism: Secondary | ICD-10-CM | POA: Insufficient documentation

## 2015-09-24 DIAGNOSIS — G8191 Hemiplegia, unspecified affecting right dominant side: Secondary | ICD-10-CM | POA: Insufficient documentation

## 2015-09-24 DIAGNOSIS — Z881 Allergy status to other antibiotic agents status: Secondary | ICD-10-CM | POA: Diagnosis not present

## 2015-09-24 DIAGNOSIS — I10 Essential (primary) hypertension: Secondary | ICD-10-CM | POA: Diagnosis not present

## 2015-09-24 DIAGNOSIS — Z87891 Personal history of nicotine dependence: Secondary | ICD-10-CM | POA: Insufficient documentation

## 2015-09-24 DIAGNOSIS — E876 Hypokalemia: Secondary | ICD-10-CM | POA: Insufficient documentation

## 2015-09-24 DIAGNOSIS — M069 Rheumatoid arthritis, unspecified: Secondary | ICD-10-CM | POA: Insufficient documentation

## 2015-09-24 DIAGNOSIS — G809 Cerebral palsy, unspecified: Secondary | ICD-10-CM | POA: Insufficient documentation

## 2015-09-24 DIAGNOSIS — Z9049 Acquired absence of other specified parts of digestive tract: Secondary | ICD-10-CM | POA: Insufficient documentation

## 2015-09-24 DIAGNOSIS — Z886 Allergy status to analgesic agent status: Secondary | ICD-10-CM | POA: Insufficient documentation

## 2015-09-24 DIAGNOSIS — N289 Disorder of kidney and ureter, unspecified: Secondary | ICD-10-CM | POA: Diagnosis not present

## 2015-09-24 DIAGNOSIS — H2511 Age-related nuclear cataract, right eye: Secondary | ICD-10-CM | POA: Insufficient documentation

## 2015-09-24 DIAGNOSIS — R479 Unspecified speech disturbances: Secondary | ICD-10-CM | POA: Diagnosis not present

## 2015-09-24 HISTORY — PX: CATARACT EXTRACTION W/PHACO: SHX586

## 2015-09-24 SURGERY — PHACOEMULSIFICATION, CATARACT, WITH IOL INSERTION
Anesthesia: Monitor Anesthesia Care | Site: Eye | Laterality: Right | Wound class: Clean

## 2015-09-24 MED ORDER — LIDOCAINE HCL (PF) 4 % IJ SOLN
INTRAMUSCULAR | Status: AC
  Filled 2015-09-24: qty 10

## 2015-09-24 MED ORDER — TETRACAINE HCL 0.5 % OP SOLN
OPHTHALMIC | Status: DC | PRN
  Administered 2015-09-24: 1 [drp] via OPHTHALMIC

## 2015-09-24 MED ORDER — CEFUROXIME OPHTHALMIC INJECTION 1 MG/0.1 ML
INJECTION | OPHTHALMIC | Status: DC | PRN
  Administered 2015-09-24: .1 mL via INTRACAMERAL

## 2015-09-24 MED ORDER — LIDOCAINE HCL (PF) 4 % IJ SOLN
INTRAOCULAR | Status: DC | PRN
  Administered 2015-09-24: 12:00:00 via OPHTHALMIC

## 2015-09-24 MED ORDER — EPINEPHRINE HCL 1 MG/ML IJ SOLN
INTRAMUSCULAR | Status: AC
  Filled 2015-09-24: qty 2

## 2015-09-24 MED ORDER — PHENYLEPHRINE HCL 10 % OP SOLN
OPHTHALMIC | Status: AC
  Administered 2015-09-24: 1 [drp] via OPHTHALMIC
  Filled 2015-09-24: qty 5

## 2015-09-24 MED ORDER — SODIUM CHLORIDE 0.9 % IV SOLN
INTRAVENOUS | Status: DC
  Administered 2015-09-24: 11:00:00 via INTRAVENOUS

## 2015-09-24 MED ORDER — CYCLOPENTOLATE HCL 2 % OP SOLN
1.0000 [drp] | OPHTHALMIC | Status: AC | PRN
  Administered 2015-09-24 (×4): 1 [drp] via OPHTHALMIC

## 2015-09-24 MED ORDER — PHENYLEPHRINE HCL 10 % OP SOLN
1.0000 [drp] | OPHTHALMIC | Status: AC | PRN
  Administered 2015-09-24 (×4): 1 [drp] via OPHTHALMIC

## 2015-09-24 MED ORDER — MIDAZOLAM HCL 2 MG/2ML IJ SOLN
INTRAMUSCULAR | Status: DC | PRN
  Administered 2015-09-24: 1 mg via INTRAVENOUS

## 2015-09-24 MED ORDER — ALFENTANIL 500 MCG/ML IJ INJ
INJECTION | INTRAMUSCULAR | Status: DC | PRN
  Administered 2015-09-24: 500 ug via INTRAVENOUS

## 2015-09-24 MED ORDER — MOXIFLOXACIN HCL 0.5 % OP SOLN
1.0000 [drp] | OPHTHALMIC | Status: DC | PRN
  Administered 2015-09-24 (×2): 1 [drp] via OPHTHALMIC

## 2015-09-24 MED ORDER — CYCLOPENTOLATE HCL 2 % OP SOLN
OPHTHALMIC | Status: AC
  Administered 2015-09-24: 1 [drp] via OPHTHALMIC
  Filled 2015-09-24: qty 2

## 2015-09-24 MED ORDER — CARBACHOL 0.01 % IO SOLN
INTRAOCULAR | Status: DC | PRN
  Administered 2015-09-24: .5 mL via INTRAOCULAR

## 2015-09-24 MED ORDER — NA CHONDROIT SULF-NA HYALURON 40-17 MG/ML IO SOLN
INTRAOCULAR | Status: AC
  Filled 2015-09-24: qty 1

## 2015-09-24 MED ORDER — EPINEPHRINE HCL 1 MG/ML IJ SOLN
INTRAOCULAR | Status: DC | PRN
  Administered 2015-09-24: 1 mL via OPHTHALMIC

## 2015-09-24 MED ORDER — CEFUROXIME OPHTHALMIC INJECTION 1 MG/0.1 ML
INJECTION | OPHTHALMIC | Status: AC
  Filled 2015-09-24: qty 0.1

## 2015-09-24 MED ORDER — BUPIVACAINE HCL (PF) 0.75 % IJ SOLN
INTRAMUSCULAR | Status: AC
  Filled 2015-09-24: qty 10

## 2015-09-24 MED ORDER — MOXIFLOXACIN HCL 0.5 % OP SOLN
OPHTHALMIC | Status: AC
  Administered 2015-09-24: 1 [drp] via OPHTHALMIC
  Filled 2015-09-24: qty 3

## 2015-09-24 MED ORDER — TETRACAINE HCL 0.5 % OP SOLN
OPHTHALMIC | Status: AC
  Filled 2015-09-24: qty 2

## 2015-09-24 MED ORDER — MOXIFLOXACIN HCL 0.5 % OP SOLN
OPHTHALMIC | Status: DC | PRN
  Administered 2015-09-24: 1 [drp] via OPHTHALMIC

## 2015-09-24 MED ORDER — LIDOCAINE HCL (PF) 4 % IJ SOLN
INTRAMUSCULAR | Status: DC | PRN
  Administered 2015-09-24: 4 mL via OPHTHALMIC

## 2015-09-24 MED ORDER — HYALURONIDASE HUMAN 150 UNIT/ML IJ SOLN
INTRAMUSCULAR | Status: AC
  Filled 2015-09-24: qty 1

## 2015-09-24 MED ORDER — NA CHONDROIT SULF-NA HYALURON 40-17 MG/ML IO SOLN
INTRAOCULAR | Status: DC | PRN
  Administered 2015-09-24: 1 mL via INTRAOCULAR

## 2015-09-24 SURGICAL SUPPLY — 30 items
CANNULA ANT/CHMB 27GA (MISCELLANEOUS) ×3 IMPLANT
CORD BIP STRL DISP 12FT (MISCELLANEOUS) ×3 IMPLANT
CUP MEDICINE 2OZ PLAST GRAD ST (MISCELLANEOUS) ×3 IMPLANT
DRAPE XRAY CASSETTE 23X24 (DRAPES) ×3 IMPLANT
ERASER HMR WETFIELD 18G (MISCELLANEOUS) ×3 IMPLANT
GLOVE BIO SURGEON STRL SZ8 (GLOVE) ×3 IMPLANT
GLOVE SURG LX 6.5 MICRO (GLOVE) ×2
GLOVE SURG LX 8.0 MICRO (GLOVE) ×2
GLOVE SURG LX STRL 6.5 MICRO (GLOVE) ×1 IMPLANT
GLOVE SURG LX STRL 8.0 MICRO (GLOVE) ×1 IMPLANT
GOWN STRL REUS W/ TWL LRG LVL3 (GOWN DISPOSABLE) ×1 IMPLANT
GOWN STRL REUS W/ TWL XL LVL3 (GOWN DISPOSABLE) ×1 IMPLANT
GOWN STRL REUS W/TWL LRG LVL3 (GOWN DISPOSABLE) ×2
GOWN STRL REUS W/TWL XL LVL3 (GOWN DISPOSABLE) ×2
LENS IOL ACRSF IQ ULTRA 15.5 (Intraocular Lens) ×1 IMPLANT
LENS IOL ACRYSOF IQ 15.5 (Intraocular Lens) ×3 IMPLANT
PACK CATARACT (MISCELLANEOUS) ×3 IMPLANT
PACK CATARACT DINGLEDEIN LX (MISCELLANEOUS) ×3 IMPLANT
PACK EYE AFTER SURG (MISCELLANEOUS) ×3 IMPLANT
SHLD EYE VISITEC  UNIV (MISCELLANEOUS) ×3 IMPLANT
SOL BSS BAG (MISCELLANEOUS) ×3
SOL PREP PVP 2OZ (MISCELLANEOUS) ×3
SOLUTION BSS BAG (MISCELLANEOUS) ×1 IMPLANT
SOLUTION PREP PVP 2OZ (MISCELLANEOUS) ×1 IMPLANT
SUT SILK 5-0 (SUTURE) ×3 IMPLANT
SYR 3ML LL SCALE MARK (SYRINGE) ×3 IMPLANT
SYR 5ML LL (SYRINGE) ×3 IMPLANT
SYR TB 1ML 27GX1/2 LL (SYRINGE) ×3 IMPLANT
WATER STERILE IRR 1000ML POUR (IV SOLUTION) ×3 IMPLANT
WIPE NON LINTING 3.25X3.25 (MISCELLANEOUS) ×3 IMPLANT

## 2015-09-24 NOTE — Interval H&P Note (Signed)
History and Physical Interval Note:  09/24/2015 11:13 AM  Stacey Torres  has presented today for surgery, with the diagnosis of cataract  The various methods of treatment have been discussed with the patient and family. After consideration of risks, benefits and other options for treatment, the patient has consented to  Procedure(s): CATARACT EXTRACTION PHACO AND INTRAOCULAR LENS PLACEMENT ([place]) (Right) as a surgical intervention .  The patient's history has been reviewed, patient examined, no change in status, stable for surgery.  I have reviewed the patient's chart and labs.  Questions were answered to the patient's satisfaction.     Kenichi Cassada

## 2015-09-24 NOTE — Op Note (Signed)
Date of Surgery: 09/24/2015 Date of Dictation: 09/24/2015 11:54 AM Pre-operative Diagnosis:  Nuclear Sclerotic Cataract right Eye Post-operative Diagnosis: same Procedure performed: Extra-capsular Cataract Extraction (ECCE) with placement of a posterior chamber intraocular lens (IOL) right Eye IOL:  Implant Name Type Inv. Item Serial No. Manufacturer Lot No. LRB No. Used  LENS IOL ACRYSOF IQ 15.5 - [id number] Intraocular Lens LENS IOL ACRYSOF IQ 15.5 [id number] ALCON   Right 1   Anesthesia: 2% Lidocaine and 4% Marcaine in a 50/50 mixture with 10 unites/ml of Hylenex given as a peribulbar Anesthesiologist: Anesthesiologist: Gijsbertus Lonia Mad, MD CRNA: Rolla Plate, CRNA Complications: none Estimated Blood Loss: less than 1 ml  Description of procedure:  The patient was given anesthesia and sedation via intravenous access. The patient was then prepped and draped in the usual fashion. A 25-gauge needle was bent for initiating the capsulorhexis. A 5-0 silk suture was placed through the conjunctiva superior and inferiorly to serve as bridle sutures. Hemostasis was obtained at the superior limbus using an eraser cautery. A partial thickness groove was made at the anterior surgical limbus with a 64 Beaver blade and this was dissected anteriorly with an [employer]aya. The anterior chamber was entered at 10 o'clock with a 1.0 mm paracentesis knife and through the lamellar dissection with a 2.6 mm Alcon keratome. Epi-Shugarcaine 0.5 CC [9 cc BSS Plus (Alcon), 3 cc 4% preservative-free lidocaine (Hospira) and 4 cc 1:1000 preservative-free, bisulfite-free epinephrine] was injected into the anterior chamber via the paracentesis tract. Epi-Shugarcaine 0.5 CC [9 cc BSS Plus (Alcon), 3 cc 4% preservative-free lidocaine (Hospira) and 4 cc 1:1000 preservative-free, bisulfite-free epinephrine] was injected into the anterior chamber via the paracentesis tract. DiscoVisc was injected to replace the  aqueous and a continuous tear curvilinear capsulorhexis was performed using a bent 25-gauge needle.  Balance salt on a syringe was used to perform hydro-dissection and phacoemulsification was carried out using a divide and conquer technique. Procedure(s) with comments: CATARACT EXTRACTION PHACO AND INTRAOCULAR LENS PLACEMENT ([facility]) (Right) - [place] 01:53 AP% 26.0 CDE 50.20 fluid pack lot #[id number]. Irrigation/aspiration was used to remove the residual cortex and the capsular bag was inflated with DiscoVisc. The intraocular lens was inserted into the capsular bag using a pre-loaded UltraSert Delivery System. Irrigation/aspiration was used to remove the residual DiscoVisc. The wound was inflated with balanced salt and checked for leaks. None were found. Miostat was injected via the paracentesis track and 0.1 ml of cefuroxime containing 1 mg of drug  was injected via the paracentesis track. The wound was checked for leaks again and none were found.   The bridal sutures were removed and two drops of Vigamox were placed on the eye. An eye shield was placed to protect the eye and the patient was discharged to the recovery area in good condition.   Purnell Daigle MD

## 2015-09-24 NOTE — Anesthesia Postprocedure Evaluation (Signed)
  Anesthesia Post-op Note  Patient: Stacey Torres  Procedure(s) Performed: Procedure(s) with comments: CATARACT EXTRACTION PHACO AND INTRAOCULAR LENS PLACEMENT ([facility]) (Right) - [place] 01:53 AP% 26.0 CDE 50.20 fluid pack lot [id number]  Anesthesia type:MAC  Patient location: PACU  Post pain: Pain level controlled  Post assessment: Post-op Vital signs reviewed, Patient's Cardiovascular Status Stable, Respiratory Function Stable, Patent Airway and No signs of Nausea or vomiting  Post vital signs: Reviewed and stable  Last Vitals:  Filed Vitals:   09/24/15 1157  BP: 129/71  Pulse: 78  Temp: 36.7 C  Resp: 16    Level of consciousness: awake, alert  and patient cooperative  Complications: No apparent anesthesia complications

## 2015-09-24 NOTE — Anesthesia Preprocedure Evaluation (Signed)
Anesthesia Evaluation  Patient identified by MRN, date of birth, ID band Patient awake    Airway Mallampati: II       Dental  (+) Poor Dentition   Pulmonary former smoker,    Pulmonary exam normal        Cardiovascular Exercise Tolerance: Poor hypertension, Pt. on home beta blockers  Rhythm:Regular Rate:Normal     Neuro/Psych Cerebral Palsy  Neuromuscular disease    GI/Hepatic negative GI ROS, Neg liver ROS,   Endo/Other  negative endocrine ROS  Renal/GU Renal InsufficiencyRenal disease     Musculoskeletal   Abdominal Normal abdominal exam  (+)   Peds  Hematology negative hematology ROS (+)   Anesthesia Other Findings   Reproductive/Obstetrics                             Anesthesia Physical Anesthesia Plan  ASA: III  Anesthesia Plan: General   Post-op Pain Management:    Induction: Intravenous  Airway Management Planned: Nasal Cannula  Additional Equipment:   Intra-op Plan:   Post-operative Plan:   Informed Consent: I have reviewed the patients History and Physical, chart, labs and discussed the procedure including the risks, benefits and alternatives for the proposed anesthesia with the patient or authorized representative who has indicated his/her understanding and acceptance.     Plan Discussed with: CRNA  Anesthesia Plan Comments:         Anesthesia Quick Evaluation

## 2015-09-24 NOTE — Discharge Instructions (Signed)
AMBULATORY SURGERY  DISCHARGE INSTRUCTIONS   1) The drugs that you were given will stay in your system until tomorrow so for the next 24 hours you should not:  A) Drive an automobile B) Make any legal decisions C) Drink any alcoholic beverage   2) You may resume regular meals tomorrow.  Today it is better to start with liquids and gradually work up to solid foods.  You may eat anything you prefer, but it is better to start with liquids, then soup and crackers, and gradually work up to solid foods.   3) Please notify your doctor immediately if you have any unusual bleeding, trouble breathing, redness and pain at the surgery site, drainage, fever, or pain not relieved by medication.    4) Additional Instructions:   Eye Surgery Discharge Instructions  Expect mild scratchy sensation or mild soreness. DO NOT RUB YOUR EYE!  The day of surgery:  Minimal physical activity, but bed rest is not required  No reading, computer work, or close hand work  No bending, lifting, or straining.  May watch TV  For 24 hours:  No driving, legal decisions, or alcoholic beverages  Safety precautions  Eat anything you prefer: It is better to start with liquids, then soup then solid foods.  _____ Eye patch should be worn until postoperative exam tomorrow.  ____ Solar shield eyeglasses should be worn for comfort in the sunlight/patch while sleeping  Resume all regular medications including aspirin or Coumadin if these were discontinued prior to surgery. You may shower, bathe, shave, or wash your hair. Tylenol may be taken for mild discomfort.  Call your doctor if you experience significant pain, nausea, or vomiting, fever > 101 or other signs of infection. [phone] or [phone] Specific instructions:  Follow-up Information    Follow up with Jacquelene Kopecky, MD In 1 day.   Specialty:  Ophthalmology   Why:  November 15 at 10:40am   Contact information:   [address]   [place] [address] [phone]          Please contact your physician with any problems or Same Day Surgery at [phone], Monday through Friday 6 am to 4 pm, or  at [facility] number at [phone].

## 2015-09-24 NOTE — H&P (Signed)
See scanned note.

## 2015-09-24 NOTE — Transfer of Care (Signed)
Immediate Anesthesia Transfer of Care Note  Patient: Stacey Torres  Procedure(s) Performed: Procedure(s) with comments: CATARACT EXTRACTION PHACO AND INTRAOCULAR LENS PLACEMENT ([facility]) (Right) - [place] 01:53 AP% 26.0 CDE 50.20 fluid pack lot [id number]  Patient Location: Short Stay  Anesthesia Type:MAC  Level of Consciousness: awake and alert   Airway & Oxygen Therapy: Patient Spontanous Breathing  Post-op Assessment: Report given to RN  Post vital signs: Reviewed  Last Vitals:  Filed Vitals:   09/24/15 1157  BP: 129/71  Pulse: 78  Temp: 36.7 C  Resp: 16    Complications: No apparent anesthesia complications

## 2015-09-27 ENCOUNTER — Telehealth: Payer: Self-pay | Admitting: Urology

## 2015-09-27 NOTE — Telephone Encounter (Signed)
-----   Message from Hollice Espy, MD sent at 05/09/2015  5:19 PM EDT ----- This patient is supposed to have follow up with me.  Please arrange.  Hollice Espy, MD

## 2015-10-03 NOTE — Telephone Encounter (Signed)
See the message below, I forgot to route it to [employer]

## 2015-10-03 NOTE — Telephone Encounter (Signed)
Please just have her come into the office for follow up.  Hollice Espy, MD

## 2015-10-03 NOTE — Telephone Encounter (Signed)
It looks like this was never addressed and so the patient never came back for a follow up appointment. This message is from June. I was going thru the admin box and I just need to know what you want me to do?  Sharyn Lull

## 2015-10-09 ENCOUNTER — Telehealth: Payer: Self-pay | Admitting: Urology

## 2015-10-09 NOTE — Telephone Encounter (Signed)
Patient did have a follow up with a urologist from [facility] and she also had a ultrasound thru them and everything was good. I spoke with her niece today and she is doing great and doesn't need any further follow up.  Sharyn Lull

## 2015-11-23 DIAGNOSIS — F39 Unspecified mood [affective] disorder: Secondary | ICD-10-CM

## 2015-11-23 DIAGNOSIS — I10 Essential (primary) hypertension: Secondary | ICD-10-CM | POA: Diagnosis not present

## 2015-11-23 DIAGNOSIS — R251 Tremor, unspecified: Secondary | ICD-10-CM | POA: Diagnosis not present

## 2015-11-23 DIAGNOSIS — M059 Rheumatoid arthritis with rheumatoid factor, unspecified: Secondary | ICD-10-CM | POA: Diagnosis not present

## 2015-11-23 DIAGNOSIS — E441 Mild protein-calorie malnutrition: Secondary | ICD-10-CM | POA: Diagnosis not present

## 2015-11-24 ENCOUNTER — Telehealth: Payer: Self-pay | Admitting: Family Medicine

## 2015-11-24 NOTE — Telephone Encounter (Signed)
Received call from [facility]  Resident had small amt of light blood in diaper- per nurse likely urethral No other symptoms /no dysuria or urinary changes  No fever/ acting normally Has had utis in past  Incontinent-would not be able to get a sample for ua without in and out cath at this time  Family does not want her sent to ED  Plan made to watch for more blood or other symptoms / monitor vitals  Will call if this re occurs  Will cc to PCP Dr Silvio Pate

## 2015-11-26 DIAGNOSIS — N939 Abnormal uterine and vaginal bleeding, unspecified: Secondary | ICD-10-CM | POA: Diagnosis not present

## 2015-11-26 NOTE — Telephone Encounter (Signed)
PLEASE NOTE: All timestamps contained within this report are represented as [place] Standard Time. CONFIDENTIALTY NOTICE: This fax transmission is intended only for the addressee. It contains information that is legally privileged, confidential or otherwise protected from use or disclosure. If you are not the intended recipient, you are strictly prohibited from reviewing, disclosing, copying using or disseminating any of this information or taking any action in reliance on or regarding this information. If you have received this fax in error, please notify us immediately by telephone so that we can arrange for its return to [place]. Phone: [phone], Toll-Free: [phone], Fax: [phone] Page: 1 of 1 Call Id: [id number] [place] Patient Name: Stacey Torres Gender: Female DOB: 02-28-1929 Age: 82 Y 2 M 7 D Return Phone Number: Address: City/State/Zip: [place] Client [place] Day - Client Client Site [place] - Day Physician Viviana Simpler Contact Type Call Call Type Triage / [place] Name Colletta Maryland CB# [phone] Relationship To Patient Care Giver Return Phone Number Please choose phone number Chief Complaint Paging or Request for Consult Initial Comment Caller states Colletta Maryland RN at [place] wanting to speak with on call doc Loura Pardon or nurse. Nurse Assessment Guidelines Guideline Title Affirmed Question Affirmed Notes Nurse Date/Time (Eastern Time) Disp. Time Eilene Ghazi Time) Disposition Final User 11/25/2015 3:48:18 PM Paged On Call back to [facility] Caryl Comes 11/25/2015 3:49:46 PM Call Completed Yes Caryl Comes After Care Instructions Given Call Event Type User Date / Time Description Paging [facility] Phone DateTime Result/Outcome Message Type Notes Loura Pardon  [id number] 11/25/2015 3:48:18 PM Paged On Call Back to Call Center Doctor Paged Please call Ashleigh with [facility] at [phone]. Loura Pardon 11/25/2015 3:50:59 PM Spoke with On Call - General Message Result

## 2015-11-26 NOTE — Telephone Encounter (Signed)
Saw her today Has history of abnormal endometrium--though biopsy negative Also known hydroureters--- but no urinary symptoms now and not sick Chose to just observe for now

## 2015-11-26 NOTE — Telephone Encounter (Signed)
PLEASE NOTE: All timestamps contained within this report are represented as [place] Standard Time. CONFIDENTIALTY NOTICE: This fax transmission is intended only for the addressee. It contains information that is legally privileged, confidential or otherwise protected from use or disclosure. If you are not the intended recipient, you are strictly prohibited from reviewing, disclosing, copying using or disseminating any of this information or taking any action in reliance on or regarding this information. If you have received this fax in error, please notify us immediately by telephone so that we can arrange for its return to [place]. Phone: [phone], Toll-Free: [phone], Fax: [phone] Page: 1 of 1 Call Id: [id number] [place] Patient Name: Stacey Torres Gender: Female DOB: 04/15/1929 Age: 82 Y 2 M 7 D Return Phone Number: [phone] (Primary) Address: City/State/Zip: [place] [address] Client [facility] - Client Client Site [place] Physician Viviana Simpler Contact Type Call Call Type Page Only Caller Name Colletta Maryland Relationship To Patient Other Is this call to report lab results? No Return Phone Number [phone] (Primary) Initial Ruth [facility]([phone]) is calling regarding pt having bleeding from Urethra Nurse Assessment Guidelines Guideline Title Affirmed Question Affirmed Notes Nurse Date/Time ([place] Time) Disp. Time Eilene Ghazi Time) Disposition Final User 11/24/2015 9:54:24 PM Paged On Call to Other Provider Sherrilyn Rist 11/24/2015 9:55:37 PM Page Completed Yes Sherrilyn Rist After Care Instructions Given Call Event Type User Date / Time Description Paging [facility] Phone DateTime Result/Outcome Message Type Notes Loura Pardon [id number] 11/24/2015 9:54:24 PM Paged On Call  to Other Provider Doctor Paged Joe w/ [facility]. Please call [facility] @ [phone] Loura Pardon 11/24/2015 9:55:21 PM Paged On Call to Another Provider Message Result Text paged Dr Glori Bickers to [facility]

## 2015-12-03 NOTE — Telephone Encounter (Signed)
No recent changes Bleeding has stopped This is an old message

## 2015-12-03 NOTE — Telephone Encounter (Signed)
PLEASE NOTE: All timestamps contained within this report are represented as [place] Standard Time. CONFIDENTIALTY NOTICE: This fax transmission is intended only for the addressee. It contains information that is legally privileged, confidential or otherwise protected from use or disclosure. If you are not the intended recipient, you are strictly prohibited from reviewing, disclosing, copying using or disseminating any of this information or taking any action in reliance on or regarding this information. If you have received this fax in error, please notify us immediately by telephone so that we can arrange for its return to [place]. Phone: [phone], Toll-Free: [phone], Fax: [phone] Page: 1 of 1 Call Id: [id number] [place] Patient Name: Stacey Torres Gender: Female DOB: October 29, 1929 Age: 82 Y 2 M 15 D Return Phone Number: Address: City/State/Zip: [place] Client [place] Day - Client Client Site [place] - Day Physician Viviana Simpler Contact Type Call Call [place] Name Colletta Maryland CB# [phone] Relationship To Patient Care Giver Is this call to report lab results? No Return Phone Number Please choose phone number Initial Comment Caller states Doctor, general practice at [place] wanting to speak with on call doc [facility] or nurse. Nurse Assessment Guidelines Guideline Title Affirmed Question Affirmed Notes Nurse Date/Time (Eastern Time) Disp. Time Eilene Ghazi Time) Disposition Final User 11/25/2015 3:48:18 PM Paged On Call back to [facility] Caryl Comes 11/25/2015 3:49:46 PM Call Completed Caryl Comes 12/03/2015 3:48:27 PM Page Completed Yes Donne Anon, RN, Debra After Care Instructions Given Call Event Type User Date / Time Description Paging [facility] Phone DateTime  Result/Outcome Message Type Notes Loura Pardon [id number] 11/25/2015 3:48:18 PM Paged On Call Back to Call Center Doctor Paged Please call Ashleigh with [facility] at [phone]. Loura Pardon 11/25/2015 3:50:59 PM Spoke with On Call - General Message Result

## 2016-01-11 DIAGNOSIS — I4891 Unspecified atrial fibrillation: Secondary | ICD-10-CM

## 2016-01-11 DIAGNOSIS — E43 Unspecified severe protein-calorie malnutrition: Secondary | ICD-10-CM

## 2016-01-11 DIAGNOSIS — R251 Tremor, unspecified: Secondary | ICD-10-CM

## 2016-01-11 DIAGNOSIS — M0580 Other rheumatoid arthritis with rheumatoid factor of unspecified site: Secondary | ICD-10-CM | POA: Diagnosis not present

## 2016-01-11 DIAGNOSIS — I10 Essential (primary) hypertension: Secondary | ICD-10-CM

## 2016-01-11 DIAGNOSIS — K219 Gastro-esophageal reflux disease without esophagitis: Secondary | ICD-10-CM | POA: Diagnosis not present

## 2016-01-26 IMAGING — CR DG CHEST 1V PORT
1 series · 1 of 1 positions shown · non-contrast
Comparison: None available for comparison at time of study
interpretation.

CLINICAL DATA: Nausea and vomiting, fever beginning yesterday.
History of spinal stenosis, cerebral palsy.

EXAM:
PORTABLE CHEST - 1 VIEW

[ap]
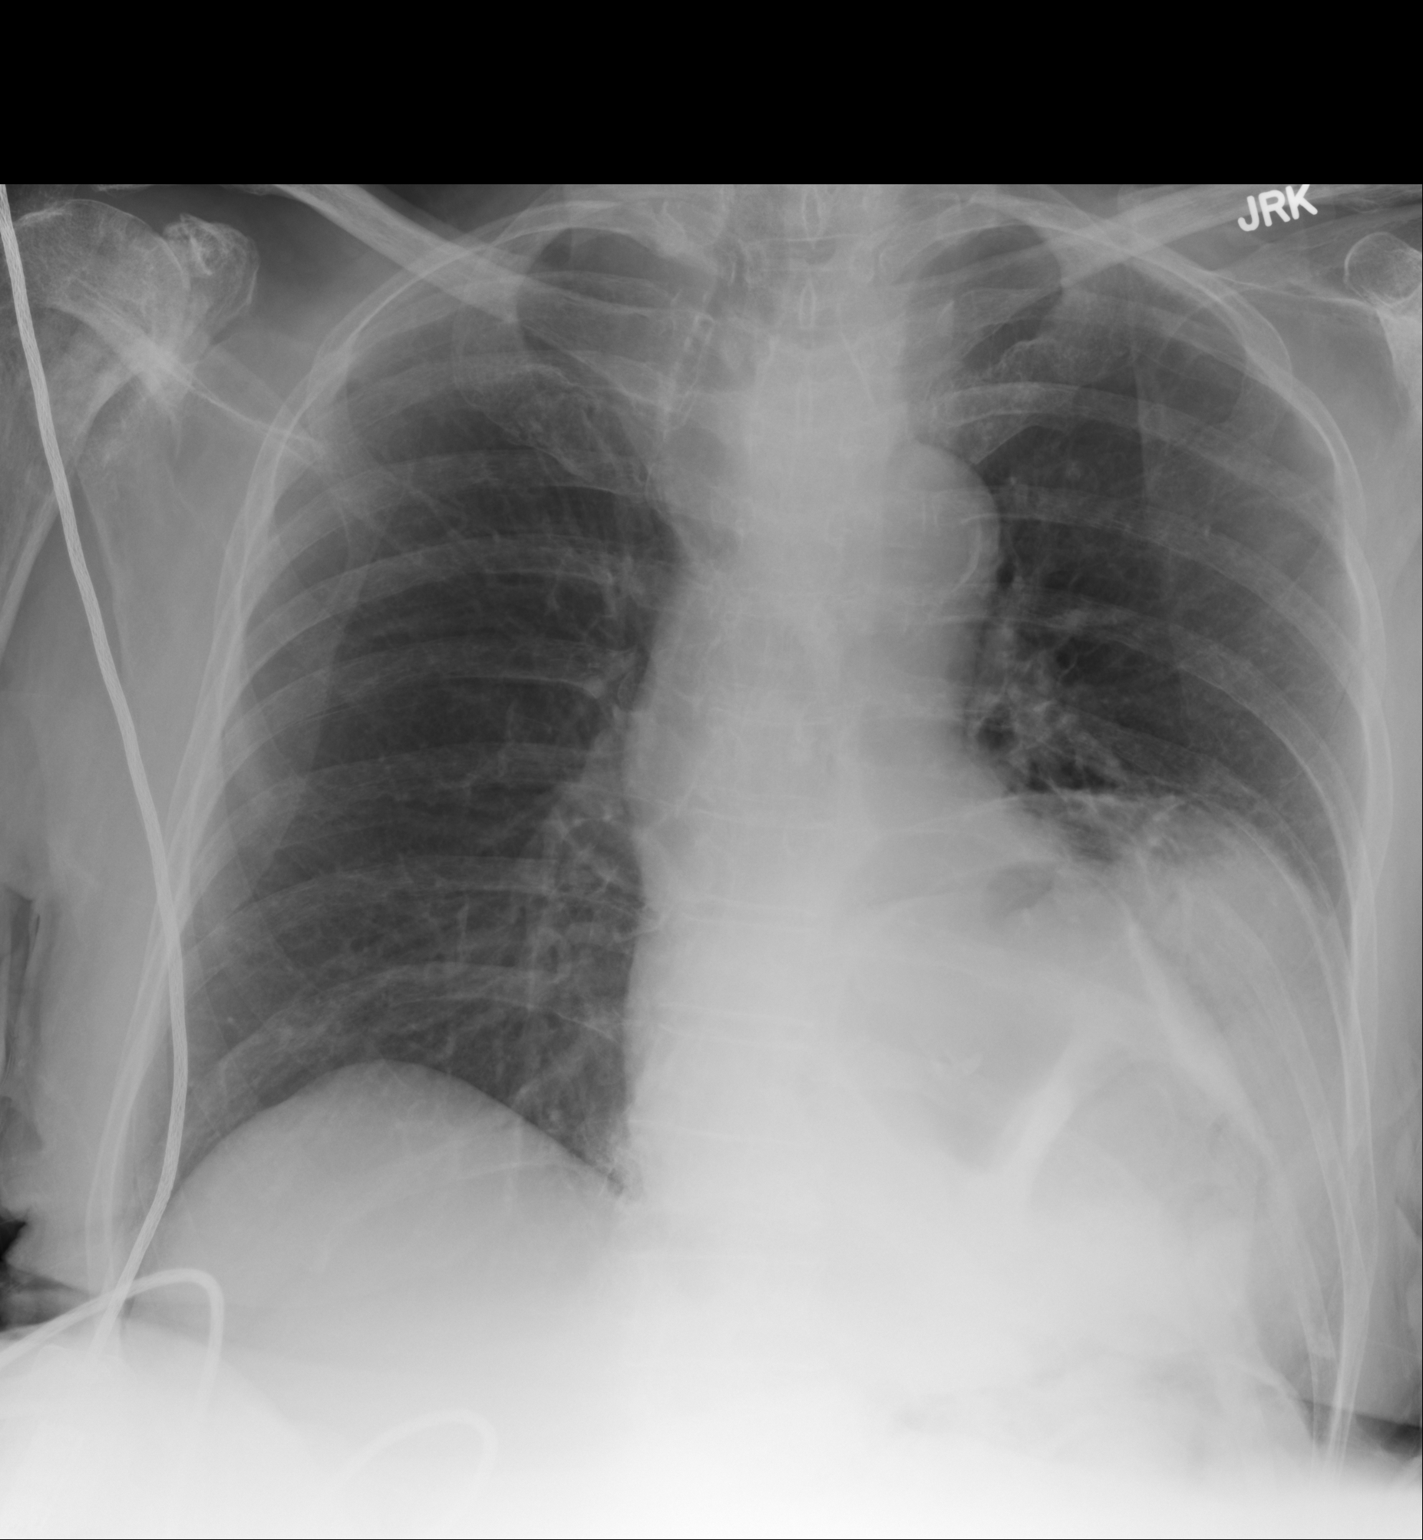

[1 of 1 positions shown; findings below may reference images not displayed]

FINDINGS: Elevated LEFT hemidiaphragm. LEFT lung base strandy densities. No
pleural effusion or focal consolidation. Cardiac silhouette appears
normal in size. Calcified aorta. Patient is rotated local RIGHT. No
pneumothorax. Severe degenerative change of the RIGHT shoulder. T12
severe compression fracture, incompletely imaged.
IMPRESSION: Elevated LEFT hemidiaphragm with atelectasis.

Severe T12 compression fracture incompletely imaged.

By: Rudi Jumper

## 2016-03-10 DIAGNOSIS — F39 Unspecified mood [affective] disorder: Secondary | ICD-10-CM

## 2016-03-10 DIAGNOSIS — I480 Paroxysmal atrial fibrillation: Secondary | ICD-10-CM | POA: Diagnosis not present

## 2016-03-10 DIAGNOSIS — M059 Rheumatoid arthritis with rheumatoid factor, unspecified: Secondary | ICD-10-CM | POA: Diagnosis not present

## 2016-03-10 DIAGNOSIS — N390 Urinary tract infection, site not specified: Secondary | ICD-10-CM

## 2016-03-10 DIAGNOSIS — E441 Mild protein-calorie malnutrition: Secondary | ICD-10-CM | POA: Diagnosis not present

## 2016-03-14 DIAGNOSIS — N390 Urinary tract infection, site not specified: Secondary | ICD-10-CM

## 2016-03-24 DIAGNOSIS — E441 Mild protein-calorie malnutrition: Secondary | ICD-10-CM

## 2016-03-30 ENCOUNTER — Inpatient Hospital Stay
Admission: EM | Admit: 2016-03-30 | Discharge: 2016-04-01 | DRG: 690 | Disposition: A | Discharge status: Skilled Nursing Facility | Payer: Medicare Other | Attending: Internal Medicine | Admitting: Internal Medicine

## 2016-03-30 ENCOUNTER — Encounter: Payer: Self-pay | Admitting: Emergency Medicine

## 2016-03-30 DIAGNOSIS — M4800 Spinal stenosis, site unspecified: Secondary | ICD-10-CM | POA: Diagnosis present

## 2016-03-30 DIAGNOSIS — I959 Hypotension, unspecified: Secondary | ICD-10-CM | POA: Diagnosis present

## 2016-03-30 DIAGNOSIS — Z7951 Long term (current) use of inhaled steroids: Secondary | ICD-10-CM

## 2016-03-30 DIAGNOSIS — I10 Essential (primary) hypertension: Secondary | ICD-10-CM | POA: Diagnosis not present

## 2016-03-30 DIAGNOSIS — B9620 Unspecified Escherichia coli [E. coli] as the cause of diseases classified elsewhere: Secondary | ICD-10-CM | POA: Diagnosis present

## 2016-03-30 DIAGNOSIS — Z681 Body mass index (BMI) 19 or less, adult: Secondary | ICD-10-CM

## 2016-03-30 DIAGNOSIS — Z1620 Resistance to unspecified antibiotic: Secondary | ICD-10-CM

## 2016-03-30 DIAGNOSIS — Z96651 Presence of right artificial knee joint: Secondary | ICD-10-CM | POA: Diagnosis not present

## 2016-03-30 DIAGNOSIS — Z961 Presence of intraocular lens: Secondary | ICD-10-CM | POA: Diagnosis not present

## 2016-03-30 DIAGNOSIS — M069 Rheumatoid arthritis, unspecified: Secondary | ICD-10-CM | POA: Diagnosis not present

## 2016-03-30 DIAGNOSIS — G808 Other cerebral palsy: Secondary | ICD-10-CM | POA: Diagnosis not present

## 2016-03-30 DIAGNOSIS — Z7401 Bed confinement status: Secondary | ICD-10-CM

## 2016-03-30 DIAGNOSIS — N319 Neuromuscular dysfunction of bladder, unspecified: Secondary | ICD-10-CM | POA: Diagnosis present

## 2016-03-30 DIAGNOSIS — E876 Hypokalemia: Secondary | ICD-10-CM | POA: Diagnosis present

## 2016-03-30 DIAGNOSIS — Z9841 Cataract extraction status, right eye: Secondary | ICD-10-CM

## 2016-03-30 DIAGNOSIS — R310 Gross hematuria: Secondary | ICD-10-CM | POA: Diagnosis not present

## 2016-03-30 DIAGNOSIS — R4781 Slurred speech: Secondary | ICD-10-CM | POA: Diagnosis not present

## 2016-03-30 DIAGNOSIS — Z79899 Other long term (current) drug therapy: Secondary | ICD-10-CM | POA: Diagnosis not present

## 2016-03-30 DIAGNOSIS — E860 Dehydration: Secondary | ICD-10-CM | POA: Diagnosis present

## 2016-03-30 DIAGNOSIS — N3941 Urge incontinence: Secondary | ICD-10-CM | POA: Diagnosis present

## 2016-03-30 DIAGNOSIS — Z993 Dependence on wheelchair: Secondary | ICD-10-CM

## 2016-03-30 DIAGNOSIS — Z1624 Resistance to multiple antibiotics: Secondary | ICD-10-CM | POA: Diagnosis present

## 2016-03-30 DIAGNOSIS — R627 Adult failure to thrive: Secondary | ICD-10-CM | POA: Diagnosis not present

## 2016-03-30 DIAGNOSIS — Z66 Do not resuscitate: Secondary | ICD-10-CM | POA: Diagnosis present

## 2016-03-30 DIAGNOSIS — Z8249 Family history of ischemic heart disease and other diseases of the circulatory system: Secondary | ICD-10-CM | POA: Diagnosis not present

## 2016-03-30 DIAGNOSIS — N390 Urinary tract infection, site not specified: Principal | ICD-10-CM | POA: Diagnosis present

## 2016-03-30 DIAGNOSIS — E871 Hypo-osmolality and hyponatremia: Secondary | ICD-10-CM | POA: Diagnosis present

## 2016-03-30 DIAGNOSIS — Z9049 Acquired absence of other specified parts of digestive tract: Secondary | ICD-10-CM | POA: Diagnosis not present

## 2016-03-30 DIAGNOSIS — N1330 Unspecified hydronephrosis: Secondary | ICD-10-CM | POA: Diagnosis present

## 2016-03-30 DIAGNOSIS — Z803 Family history of malignant neoplasm of breast: Secondary | ICD-10-CM | POA: Diagnosis not present

## 2016-03-30 DIAGNOSIS — Z87891 Personal history of nicotine dependence: Secondary | ICD-10-CM

## 2016-03-30 LAB — COMPREHENSIVE METABOLIC PANEL
ALBUMIN: 3.5 g/dL (ref 3.5–5.0)
ALK PHOS: 87 U/L (ref 38–126)
ALT: 9 U/L — AB (ref 14–54)
AST: 21 U/L (ref 15–41)
Anion gap: 15 (ref 5–15)
BILIRUBIN TOTAL: 0.6 mg/dL (ref 0.3–1.2)
BUN: 86 mg/dL — AB (ref 6–20)
CALCIUM: 9.7 mg/dL (ref 8.9–10.3)
CO2: 34 mmol/L — ABNORMAL HIGH (ref 22–32)
CREATININE: 0.87 mg/dL (ref 0.44–1.00)
Chloride: 74 mmol/L — ABNORMAL LOW (ref 101–111)
GFR calc Af Amer: >60 mL/min (ref >60)
GFR, EST NON AFRICAN AMERICAN: 59 mL/min — AB (ref >60)
GLUCOSE: 108 mg/dL — AB (ref 65–99)
POTASSIUM: 2.9 mmol/L — AB (ref 3.5–5.1)
Sodium: 123 mmol/L — ABNORMAL LOW (ref 135–145)
TOTAL PROTEIN: 7.6 g/dL (ref 6.5–8.1)

## 2016-03-30 LAB — URINALYSIS COMPLETE WITH MICROSCOPIC (ARMC ONLY)
BILIRUBIN URINE: NEGATIVE
GLUCOSE, UA: NEGATIVE mg/dL
Ketones, ur: NEGATIVE mg/dL
Nitrite: NEGATIVE
Protein, ur: 100 mg/dL — AB
SPECIFIC GRAVITY, URINE: 1.013 (ref 1.005–1.030)
SQUAMOUS EPITHELIAL / LPF: NONE SEEN
pH: 5 (ref 5.0–8.0)

## 2016-03-30 LAB — CBC WITH DIFFERENTIAL/PLATELET
BASOS ABS: 0.1 10*3/uL (ref 0–0.1)
Basophils Relative: 1 %
EOS ABS: 0.1 10*3/uL (ref 0–0.7)
HCT: 32.1 % — ABNORMAL LOW (ref 35.0–47.0)
Hemoglobin: 11.1 g/dL — ABNORMAL LOW (ref 12.0–16.0)
Lymphs Abs: 0.5 10*3/uL — ABNORMAL LOW (ref 1.0–3.6)
MCH: 32.6 pg (ref 26.0–34.0)
MCHC: 34.7 g/dL (ref 32.0–36.0)
MCV: 93.8 fL (ref 80.0–100.0)
MONO ABS: 0.6 10*3/uL (ref 0.2–0.9)
Monocytes Relative: 9 %
Neutro Abs: 5.2 10*3/uL (ref 1.4–6.5)
Neutrophils Relative %: 81 %
PLATELETS: 408 10*3/uL (ref 150–440)
RBC: 3.42 MIL/uL — ABNORMAL LOW (ref 3.80–5.20)
RDW: 14.9 % — AB (ref 11.5–14.5)
WBC: 6.3 10*3/uL (ref 3.6–11.0)

## 2016-03-30 LAB — LACTIC ACID, PLASMA: LACTIC ACID, VENOUS: 1.2 mmol/L (ref 0.5–2.0)

## 2016-03-30 MED ORDER — POLYETHYLENE GLYCOL 3350 17 G PO PACK: 17.0000 g | PACK | Freq: Every day | ORAL | Status: DC | PRN

## 2016-03-30 MED ORDER — PIPERACILLIN-TAZOBACTAM 3.375 G IVPB
INTRAVENOUS | Status: AC
  Filled 2016-03-30: qty 50

## 2016-03-30 MED ORDER — FOLIC ACID 1 MG PO TABS
1.0000 mg | ORAL_TABLET | Freq: Every day | ORAL | Status: DC
  Administered 2016-03-31 – 2016-04-01 (×2): 1 mg via ORAL

## 2016-03-30 MED ORDER — ARTIFICIAL TEARS OP OINT
TOPICAL_OINTMENT | Freq: Every evening | OPHTHALMIC | Status: DC | PRN
  Filled 2016-03-30: qty 3.5

## 2016-03-30 MED ORDER — MONTELUKAST SODIUM 10 MG PO TABS
10.0000 mg | ORAL_TABLET | Freq: Every evening | ORAL | Status: DC
  Administered 2016-03-30 – 2016-03-31 (×2): 10 mg via ORAL
  Filled 2016-03-30 (×2): qty 1

## 2016-03-30 MED ORDER — PIPERACILLIN-TAZOBACTAM 3.375 G IVPB
3.3750 g | Freq: Three times a day (TID) | INTRAVENOUS | Status: DC
  Administered 2016-03-30 – 2016-04-01 (×6): 3.375 g via INTRAVENOUS
  Filled 2016-03-30 (×9): qty 50

## 2016-03-30 MED ORDER — ALBUTEROL SULFATE (2.5 MG/3ML) 0.083% IN NEBU: 2.5000 mg | INHALATION_SOLUTION | RESPIRATORY_TRACT | Status: DC | PRN

## 2016-03-30 MED ORDER — DULOXETINE HCL 60 MG PO CPEP
60.0000 mg | ORAL_CAPSULE | Freq: Every day | ORAL | Status: DC
  Administered 2016-04-01: 60 mg via ORAL
  Filled 2016-03-30: qty 1

## 2016-03-30 MED ORDER — PRIMIDONE 50 MG PO TABS
50.0000 mg | ORAL_TABLET | Freq: Two times a day (BID) | ORAL | Status: DC
  Administered 2016-03-30 – 2016-04-01 (×4): 50 mg via ORAL
  Filled 2016-03-30 (×4): qty 1

## 2016-03-30 MED ORDER — HYDROXYCHLOROQUINE SULFATE 200 MG PO TABS
200.0000 mg | ORAL_TABLET | Freq: Two times a day (BID) | ORAL | Status: DC
  Administered 2016-03-30 – 2016-04-01 (×4): 200 mg via ORAL
  Filled 2016-03-30 (×4): qty 1

## 2016-03-30 MED ORDER — SENNOSIDES-DOCUSATE SODIUM 8.6-50 MG PO TABS
1.0000 | ORAL_TABLET | Freq: Every day | ORAL | Status: DC
  Administered 2016-03-30 – 2016-03-31 (×2): 1 via ORAL
  Filled 2016-03-30 (×4): qty 1

## 2016-03-30 MED ORDER — SODIUM CHLORIDE 0.9 % IV SOLN
INTRAVENOUS | Status: AC
  Administered 2016-03-30 – 2016-03-31 (×2): via INTRAVENOUS

## 2016-03-30 MED ORDER — ENOXAPARIN SODIUM 40 MG/0.4ML ~~LOC~~ SOLN
40.0000 mg | SUBCUTANEOUS | Status: DC
  Administered 2016-03-30 – 2016-03-31 (×2): 40 mg via SUBCUTANEOUS
  Filled 2016-03-30 (×2): qty 0.4

## 2016-03-30 MED ORDER — ONDANSETRON HCL 4 MG PO TABS: 4.0000 mg | ORAL_TABLET | Freq: Four times a day (QID) | ORAL | Status: DC | PRN

## 2016-03-30 MED ORDER — ACETAMINOPHEN 325 MG PO TABS: 650.0000 mg | ORAL_TABLET | Freq: Four times a day (QID) | ORAL | Status: DC | PRN

## 2016-03-30 MED ORDER — HYDROCODONE-ACETAMINOPHEN 5-325 MG PO TABS
2.0000 | ORAL_TABLET | Freq: Every day | ORAL | Status: DC
  Administered 2016-03-31 – 2016-04-01 (×2): 2 via ORAL
  Filled 2016-03-30 (×2): qty 2

## 2016-03-30 MED ORDER — PIPERACILLIN-TAZOBACTAM 3.375 G IVPB 30 MIN: 3.3750 g | INTRAVENOUS | Status: DC

## 2016-03-30 MED ORDER — ENSURE ENLIVE PO LIQD
237.0000 mL | Freq: Two times a day (BID) | ORAL | Status: DC
  Administered 2016-03-31 – 2016-04-01 (×3): 237 mL via ORAL

## 2016-03-30 MED ORDER — SODIUM CHLORIDE 0.9 % IV BOLUS (SEPSIS)
1000.0000 mL | Freq: Once | INTRAVENOUS | Status: AC
  Administered 2016-03-30: 1000 mL via INTRAVENOUS

## 2016-03-30 MED ORDER — POTASSIUM CHLORIDE CRYS ER 20 MEQ PO TBCR
40.0000 meq | EXTENDED_RELEASE_TABLET | ORAL | Status: AC
  Administered 2016-03-30 (×3): 40 meq via ORAL
  Filled 2016-03-30 (×4): qty 2

## 2016-03-30 MED ORDER — FLEET ENEMA 7-19 GM/118ML RE ENEM
1.0000 | ENEMA | Freq: Once | RECTAL | Status: DC | PRN
Start: 2016-03-30 — End: 2016-04-01

## 2016-03-30 MED ORDER — ACETAMINOPHEN 650 MG RE SUPP: 650.0000 mg | Freq: Four times a day (QID) | RECTAL | Status: DC | PRN

## 2016-03-30 MED ORDER — BISACODYL 5 MG PO TBEC: 5.0000 mg | DELAYED_RELEASE_TABLET | Freq: Every day | ORAL | Status: DC | PRN

## 2016-03-30 MED ORDER — FLUTICASONE PROPIONATE 50 MCG/ACT NA SUSP
2.0000 | Freq: Two times a day (BID) | NASAL | Status: DC
  Administered 2016-03-30 – 2016-04-01 (×4): 2 via NASAL
  Filled 2016-03-30: qty 16

## 2016-03-30 MED ORDER — METOPROLOL SUCCINATE ER 50 MG PO TB24
50.0000 mg | ORAL_TABLET | Freq: Two times a day (BID) | ORAL | Status: DC
  Administered 2016-03-31 – 2016-04-01 (×2): 50 mg via ORAL
  Filled 2016-03-30 (×2): qty 1

## 2016-03-30 MED ORDER — ONDANSETRON HCL 4 MG/2ML IJ SOLN
4.0000 mg | Freq: Four times a day (QID) | INTRAMUSCULAR | Status: DC | PRN
Start: 2016-03-30 — End: 2016-04-01

## 2016-03-30 MED ORDER — DOCUSATE SODIUM 100 MG PO CAPS
100.0000 mg | ORAL_CAPSULE | Freq: Two times a day (BID) | ORAL | Status: DC
  Administered 2016-03-30 – 2016-04-01 (×4): 100 mg via ORAL
  Filled 2016-03-30 (×4): qty 1

## 2016-03-30 MED ORDER — MIRTAZAPINE 15 MG PO TABS
15.0000 mg | ORAL_TABLET | Freq: Every day | ORAL | Status: DC
  Administered 2016-03-30 – 2016-03-31 (×2): 15 mg via ORAL
  Filled 2016-03-30 (×2): qty 1

## 2016-03-30 MED ORDER — HYDROCODONE-ACETAMINOPHEN 5-325 MG PO TABS
1.0000 | ORAL_TABLET | ORAL | Status: DC | PRN
  Administered 2016-03-31 – 2016-04-01 (×3): 1 via ORAL
  Filled 2016-03-30 (×3): qty 1

## 2016-03-30 MED ORDER — PANTOPRAZOLE SODIUM 40 MG PO TBEC
40.0000 mg | DELAYED_RELEASE_TABLET | Freq: Every day | ORAL | Status: DC
  Administered 2016-03-31 – 2016-04-01 (×2): 40 mg via ORAL
  Filled 2016-03-30 (×2): qty 1

## 2016-03-30 NOTE — Care Management Note (Signed)
Case Management Note  Patient Details  Name: Stacey Torres MRN: [id number] Date of Birth: 10-14-1929  Subjective/Objective:        82yo Stacey Torres from [facility] was admitted 03/30/16 after failing outpatient treatment for a UTI. POA is niece Stacey Torres. Hx of cerebral palsy but can move herself from the bed onto her motorized wheelchair. Lower extremity contractures. Incontinent of bowel and bladder.  CSW to follow for return to [facility] after this hospitalization. Case management will assist with discharge planning if needed.            Action/Plan:   Expected Discharge Date:                  Expected Discharge Plan:     In-House Referral:     Discharge planning Services     Post Acute Care Choice:    Choice offered to:     DME Arranged:    DME Agency:     HH Arranged:    [place] Agency:     Status of Service:     Medicare Important Message Given:    Date Medicare IM Given:    Medicare IM give by:    Date Additional Medicare IM Given:    Additional Medicare Important Message give by:     If discussed at [place] of Stay Meetings, dates discussed:    Additional Comments:  Stacey Torres A, RN 03/30/2016, 3:29 PM

## 2016-03-30 NOTE — Progress Notes (Addendum)
ANTIBIOTIC CONSULT NOTE - INITIAL  Pharmacy Consult for Zosyn  Indication: UTI  Allergies  Allergen Reactions  . Aspirin Other (See Comments)    Gi bleeding  . Macrobid [Nitrofurantoin] Other (See Comments)    Reaction:  Unknown     Patient Measurements: Height: 5\' 3"  (160 cm) Weight: 106 lb 6.4 oz (48.263 kg) IBW/kg (Calculated) : 52.4 Adjusted Body Weight:   Vital Signs: Temp: 98 F (36.7 C) (05/21 1008) Temp Source: Oral (05/21 1008) BP: 93/61 mmHg (05/21 1100) Pulse Rate: 90 (05/21 1100) Intake/Output from previous day:   Intake/Output from this shift:    Labs:  Recent Labs  03/30/16 1020  WBC 6.3  HGB 11.1*  PLT 408  CREATININE 0.87   Estimated Creatinine Clearance: 36.7 mL/min (by C-G formula based on Cr of 0.87). No results for input(s): VANCOTROUGH, VANCOPEAK, VANCORANDOM, GENTTROUGH, GENTPEAK, GENTRANDOM, TOBRATROUGH, TOBRAPEAK, TOBRARND, AMIKACINPEAK, AMIKACINTROU, AMIKACIN in the last 72 hours.   Microbiology: No results found for this or any previous visit (from the past 720 hour(s)).  Medical History: Past Medical History  Diagnosis Date  . Cerebral palsy, hemiplegic ([place])     pt is intelligant  . Neurologic gait dysfunction   . Spinal stenosis   . Urge urinary incontinence   . Hypertension   . Arthritis   . Neurogenic bladder   . Hydronephrosis, right   . Chronic cystitis   . Gross hematuria   . RA (rheumatoid arthritis) (HCC)     advanced  . Flexion contracture joint of multiple sites     secondary to advanced ra  . Slurred speech   . Cancer ([place]) skin ca  . Seasonal allergies   . UTI (lower urinary tract infection)   . Sepsis due to urinary tract infection ([place])   . Cough   . RA (rheumatoid arthritis) ([place])   . Edema, lower extremity   . Cataract     Medications:   (Not in a hospital admission) Assessment: CrCl = 36.7 ml/min  Pt failed Cipro as outpatient therapy.   Goal of Therapy:  resolution of infection  Plan:   Expected duration 7 days with resolution of temperature and/or normalization of WBC   Will start Zosyn 3.375 gm IV Q8H EI on 5/21 @ ~ 12:30.   Stacey Torres D 03/30/2016,12:15 PM

## 2016-03-30 NOTE — ED Notes (Signed)
Floor requesting to move patient to another room due to fall risk and being closer to nurses station.

## 2016-03-30 NOTE — ED Provider Notes (Signed)
[facility] Emergency Department Provider Note  ____________________________________________  Time seen: Approximately 12:02 PM  I have reviewed the triage vital signs and the nursing notes.   HISTORY  Chief Complaint Urinary Tract Infection; Dehydration; and Failure To Thrive    HPI Stacey Torres is a 82 y.o. female history of cerebral palsy, lower extremity weakness and debility, rheumatoid arthritis on Plaquenil.  The patient presents today and tells me that they think she is "dehydrated" and she's been feeling very tired. She has had fatigue and think she may still have a urinary tract infection due to frequent urination. She's been on antibiotic for over a week now per the patient.  She denies being in any pain, no nausea or vomiting. She reports weakness in her legs is chronic and no new concerns. No fevers. No chills. She does state that she is barely eaten anything for the last several days to discuss she hasn't felt like it.   Past Medical History  Diagnosis Date  . Cerebral palsy, hemiplegic ([place])     pt is intelligant  . Neurologic gait dysfunction   . Spinal stenosis   . Urge urinary incontinence   . Hypertension   . Arthritis   . Neurogenic bladder   . Hydronephrosis, right   . Chronic cystitis   . Gross hematuria   . RA (rheumatoid arthritis) (HCC)     advanced  . Flexion contracture joint of multiple sites     secondary to advanced ra  . Slurred speech   . Cancer ([place]) skin ca  . Seasonal allergies   . UTI (lower urinary tract infection)   . Sepsis due to urinary tract infection ([place])   . Cough   . RA (rheumatoid arthritis) ([place])   . Edema, lower extremity   . Cataract     Patient Active Problem List   Diagnosis Date Noted  . UTI (lower urinary tract infection) 03/30/2016  . Protein-calorie malnutrition, severe ([place]) 07/07/2015  . Sepsis ([place]) 07/06/2015  . Upper GI bleed 07/06/2015  . Hyperkalemia 07/06/2015  .  Endometrial thickening on ultra sound   . Malnutrition of moderate degree ([place]) 04/02/2015  . Rheumatoid arthritis ([place]) 04/01/2015  . Essential hypertension 04/01/2015  . Hydronephrosis of right kidney 11/21/2013    Past Surgical History  Procedure Laterality Date  . Total knee arthroplasty Right 2005  . Cholecystectomy  1995  . Cystoscopy with retrograde pyelogram, ureteroscopy and stent placement Bilateral 11/21/2013    Procedure: CYSTOSCOPY WITH RIGHT RETROGRADE PYELOGRAM, RIGHT FLEXIBLE and rigid URETEROSCOPY  PLACEMENT;  Surgeon: Bernestine Amass, MD;  Location: [facility];  Service: Urology;  Laterality: Bilateral;  . Cystogram  11/21/2013    Procedure: CYSTOGRAM;  Surgeon: Bernestine Amass, MD;  Location: [facility];  Service: Urology;;  . Dg toes rt foot multiple specif ([facility] hx)    . Hysteroscopy w/d&c N/A 05/09/2015    Procedure: DILATATION AND CURETTAGE [place], Myosure;  Surgeon: Gillis Ends, MD;  Location: [facility];  Service: Gynecology;  Laterality: N/A;  . Joint replacement    . Cataract extraction w/phaco Right 09/24/2015    Procedure: CATARACT EXTRACTION PHACO AND INTRAOCULAR LENS PLACEMENT (IOC);  Surgeon: Estill Cotta, MD;  Location: [facility];  Service: Ophthalmology;  Laterality: Right;  [place] 01:53 AP% 26.0 CDE 50.20 fluid pack lot [id number]    Current Outpatient Rx  Name  Route  Sig  Dispense  Refill  . acetaminophen (TYLENOL) 650 MG CR tablet  Oral   Take 650 mg by mouth 3 (three) times daily.          Marland Kitchen ALPRAZolam (XANAX) 0.25 MG tablet   Oral   Take 1 tablet (0.25 mg total) by mouth 2 (two) times daily as needed.   10 tablet   0   . Ascorbic Acid (VITAMIN C) 1000 MG tablet   Oral   Take 1,000 mg by mouth daily.         . Calcium Carbonate-Vitamin D (CALCIUM 600+D) 600-400 MG-UNIT per tablet   Oral   Take 1 tablet by mouth every evening.         . ciprofloxacin (CIPRO) 250 MG tablet   Oral    Take 250 mg by mouth 2 (two) times daily.         . DULoxetine (CYMBALTA) 60 MG capsule   Oral   Take 60 mg by mouth daily.          . fluticasone (FLONASE) 50 MCG/ACT nasal spray   Each Nare   Place 2 sprays into both nostrils 2 (two) times daily.         . folic acid (FOLVITE) 1 MG tablet   Oral   Take 1 mg by mouth daily.         Marland Kitchen HYDROcodone-acetaminophen (NORCO/VICODIN) 5-325 MG per tablet   Oral   Take 2 tablets by mouth daily.          . hydroxychloroquine (PLAQUENIL) 200 MG tablet   Oral   Take 200 mg by mouth 2 (two) times daily.         . Hypromellose (ARTIFICIAL TEARS OP)   Ophthalmic   Apply 2 drops to eye 2 (two) times daily as needed (for dry eyes).         Marland Kitchen loratadine (CLARITIN) 10 MG tablet   Oral   Take 10 mg by mouth daily.          . methotrexate (RHEUMATREX) 2.5 MG tablet   Oral   Take 15 mg by mouth once a week. On Friday         . metoprolol succinate (TOPROL-XL) 50 MG 24 hr tablet   Oral   Take 50 mg by mouth 2 (two) times daily.          . mirtazapine (REMERON) 15 MG tablet   Oral   Take 15 mg by mouth at bedtime.         . montelukast (SINGULAIR) 10 MG tablet   Oral   Take 10 mg by mouth every evening.         . Multiple Vitamin (THEREMS) TABS   Oral   Take 1 tablet by mouth daily.         Marland Kitchen omeprazole (PRILOSEC) 20 MG capsule   Oral   Take 1 capsule (20 mg total) by mouth daily.   60 capsule   0   . ondansetron (ZOFRAN) 4 MG tablet   Oral   Take 4 mg by mouth every 4 (four) hours as needed for nausea or vomiting.          . polyethylene glycol (MIRALAX / GLYCOLAX) packet   Oral   Take 17 g by mouth daily as needed.         . primidone (MYSOLINE) 50 MG tablet   Oral   Take 50 mg by mouth 2 (two) times daily.          . sennosides-docusate sodium (SENOKOT-S) 8.6-50  MG tablet   Oral   Take 1 tablet by mouth at bedtime.         . traZODone (DESYREL) 50 MG tablet   Oral   Take 50 mg by  mouth at bedtime.         . feeding supplement, ENSURE ENLIVE, (ENSURE ENLIVE) LIQD   Oral   Take 237 mLs by mouth 2 (two) times daily between meals. Patient not taking: Reported on 07/06/2015   237 mL   12   . levofloxacin (LEVAQUIN) 250 MG tablet   Oral   Take 1 tablet (250 mg total) by mouth daily. Patient not taking: Reported on 03/30/2016   5 tablet   0     Allergies Aspirin and Macrobid  Family History  Problem Relation Age of Onset  . CAD Mother   . Deep vein thrombosis Father   . Breast cancer Sister     Social History Social History  Substance Use Topics  . Smoking status: Former Smoker    Quit date: 11/18/1958  . Smokeless tobacco: Never Used  . Alcohol Use: No    Review of Systems Constitutional: No fever/chills Eyes: No visual changes. ENT: No sore throat. Cardiovascular: Denies chest pain. Respiratory: Denies shortness of breath. Gastrointestinal: No abdominal pain.  No nausea, no vomiting.  No diarrhea.  No constipation. Genitourinary: Negative for dysuria. Musculoskeletal: Negative for back pain. Skin: Negative for rash. Neurological: Negative for headaches, focal weakness or numbness.  10-point ROS otherwise negative.  ____________________________________________   PHYSICAL EXAM:  VITAL SIGNS: ED Triage Vitals  Enc Vitals Group     BP 03/30/16 1008 113/67 mmHg     Pulse Rate 03/30/16 1008 90     Resp 03/30/16 1008 21     Temp 03/30/16 1008 98 F (36.7 C)     Temp Source 03/30/16 1008 Oral     SpO2 03/30/16 1008 98 %     Weight 03/30/16 1008 106 lb 6.4 oz (48.263 kg)     Height 03/30/16 1008 5\' 3"  (1.6 m)     Head Cir --      Peak Flow --      Pain Score 03/30/16 1009 0     Pain Loc --      Pain Edu? --      Excl. in [place]? --    Constitutional: Alert and oriented. Fatigue, but pleasant and in no distress Eyes: Conjunctivae are normal. PERRL. EOMI. Head: Atraumatic. Nose: No congestion/rhinnorhea. Mouth/Throat: Mucous  membranes are very dry.  Oropharynx non-erythematous. Neck: No stridor.   Cardiovascular: Normal rate, regular rhythm. Grossly normal heart sounds.  Good peripheral circulation. Respiratory: Normal respiratory effort.  No retractions. Lungs CTAB. Gastrointestinal: Soft and nontender. No distention. No abdominal bruits. No CVA tenderness. Musculoskeletal: No lower extremity tenderness nor edema.  No joint effusions. Chronic contraction appearance of the lower extremities. Neurologic:  Normal speech and language. No gross focal neurologic deficits are appreciated except the patient does have severe arthritic changes in both hands, and she has very little if any use of the lower extremities which is chronic. Skin:  Skin is warm, dry and intact. No rash noted. Psychiatric: Mood and affect are normal. Speech and behavior are normal.  ____________________________________________   LABS (all labs ordered are listed, but only abnormal results are displayed)  Labs Reviewed  COMPREHENSIVE METABOLIC PANEL - Abnormal; Notable for the following:    Sodium 123 (*)    Potassium 2.9 (*)    Chloride 74 (*)  CO2 34 (*)    Glucose, Bld 108 (*)    BUN 86 (*)    ALT 9 (*)    GFR calc non Af Amer 59 (*)    All other components within normal limits  CBC WITH DIFFERENTIAL/PLATELET - Abnormal; Notable for the following:    RBC 3.42 (*)    Hemoglobin 11.1 (*)    HCT 32.1 (*)    RDW 14.9 (*)    Lymphs Abs 0.5 (*)    All other components within normal limits  URINALYSIS COMPLETEWITH MICROSCOPIC ([facility] ONLY) - Abnormal; Notable for the following:    Color, Urine YELLOW (*)    APPearance TURBID (*)    Hgb urine dipstick 3+ (*)    Protein, ur 100 (*)    Leukocytes, UA 2+ (*)    Bacteria, UA MANY (*)    All other components within normal limits  URINE CULTURE  URINE CULTURE  LACTIC ACID, PLASMA   ____________________________________________  EKG  Reviewed and interpreted by me at 10:15 AM Heart  rate 90 PR 160 QT 560 mildly prolonged Normal sinus rhythm Mild artifact noted, no evidence of obvious or clear ischemic change. It is notable patient is not complaining of any cardiac or pulmonary symptoms. ____________________________________________  RADIOLOGY   ____________________________________________   PROCEDURES  Procedure(s) performed: None  Critical Care performed: No  ____________________________________________   INITIAL IMPRESSION / ASSESSMENT AND PLAN / ED COURSE  Pertinent labs & imaging results that were available during my care of the patient were reviewed by me and considered in my medical decision making (see chart for details).  Patient presents for evaluation of generalized weakness, thought to be failing treatment for UTI. Her labs to indicate she is likely dehydrated with elevated BUN, hyponatremia, as well as urinary tract infection. The patient has failed 7 days of outpatient treatment and has a history of pseudomonal UTI in the past. We will admit the patient for ongoing treatment due to resistant urinary tract infection. She is awake alert and mentating well at this time, currently in no acute distress. ____________________________________________   FINAL CLINICAL IMPRESSION(S) / ED DIAGNOSES  Final diagnoses:  UTI (lower urinary tract infection)  Hyponatremia  Therapy failure due to antibiotic resistance      Delman Kitten, MD 03/30/16 1216

## 2016-03-30 NOTE — ED Notes (Signed)
Patient arrived via EMS from [facility].  Patient has been treated for a UTI x 2 weeks with Cipro, per EMS, patient has not eaten solid foods in 2 weeks and has not been drinking.  EMS did state patient drank some water this morning when she took her medications.  Pt is alert, oriented and mumbles and is also contracted which is her baseline.

## 2016-03-30 NOTE — H&P (Signed)
[facility] [place] NAME: Stacey Torres    MR#:  [id number]  DATE OF BIRTH:  02/02/1929  DATE OF ADMISSION:  03/30/2016  PRIMARY CARE PHYSICIAN: Viviana Simpler, MD   REQUESTING/REFERRING PHYSICIAN: Dr. Jacqualine Code  CHIEF COMPLAINT:   Chief Complaint  Patient presents with  . Urinary Tract Infection  . Dehydration  . Failure To Thrive    HISTORY OF PRESENT ILLNESS:  Stacey Torres  is a 82 y.o. female with a known history of Cerebral palsy, urinary incontinence, bedbound status presents to the emergency room due to weakness and dehydration. Patient was treated with ciprofloxacin for 7 days with no improvement for UTI. Cultures not available. In the past patient had Klebsiella and Pseudomonas which were pansensitive. She has no abdominal pain. Feels lightheaded. Has chronic slurred speech. Bed bound due to cerebral palsy. Gets into wheelchair with assistance. Here patient has been found to be hypotensive with blood pressure of 88/60. Hyponatremia. Significantly elevated BUN of 80 and normal creatinine.  PAST MEDICAL HISTORY:   Past Medical History  Diagnosis Date  . Cerebral palsy, hemiplegic ([place])     pt is intelligant  . Neurologic gait dysfunction   . Spinal stenosis   . Urge urinary incontinence   . Hypertension   . Arthritis   . Neurogenic bladder   . Hydronephrosis, right   . Chronic cystitis   . Gross hematuria   . RA (rheumatoid arthritis) (HCC)     advanced  . Flexion contracture joint of multiple sites     secondary to advanced ra  . Slurred speech   . Cancer ([place]) skin ca  . Seasonal allergies   . UTI (lower urinary tract infection)   . Sepsis due to urinary tract infection ([place])   . Cough   . RA (rheumatoid arthritis) ([place])   . Edema, lower extremity   . Cataract     PAST SURGICAL HISTORY:   Past Surgical History  Procedure Laterality Date  . Total knee arthroplasty Right 2005  .  Cholecystectomy  1995  . Cystoscopy with retrograde pyelogram, ureteroscopy and stent placement Bilateral 11/21/2013    Procedure: CYSTOSCOPY WITH RIGHT RETROGRADE PYELOGRAM, RIGHT FLEXIBLE and rigid URETEROSCOPY  PLACEMENT;  Surgeon: Bernestine Amass, MD;  Location: [facility];  Service: Urology;  Laterality: Bilateral;  . Cystogram  11/21/2013    Procedure: CYSTOGRAM;  Surgeon: Bernestine Amass, MD;  Location: [facility];  Service: Urology;;  . Dg toes rt foot multiple specif ([facility] hx)    . Hysteroscopy w/d&c N/A 05/09/2015    Procedure: DILATATION AND CURETTAGE Pollyann Glen, Myosure;  Surgeon: Gillis Ends, MD;  Location: [facility];  Service: Gynecology;  Laterality: N/A;  . Joint replacement    . Cataract extraction w/phaco Right 09/24/2015    Procedure: CATARACT EXTRACTION PHACO AND INTRAOCULAR LENS PLACEMENT (IOC);  Surgeon: Estill Cotta, MD;  Location: [facility];  Service: Ophthalmology;  Laterality: Right;  [place] 01:53 AP% 26.0 CDE 50.20 fluid pack lot #[id number]    SOCIAL HISTORY:   Social History  Substance Use Topics  . Smoking status: Former Smoker    Quit date: 11/18/1958  . Smokeless tobacco: Never Used  . Alcohol Use: No    FAMILY HISTORY:   Family History  Problem Relation Age of Onset  . CAD Mother   . Deep vein thrombosis Father   . Breast cancer Sister     DRUG ALLERGIES:   Allergies  Allergen Reactions  . Aspirin Other (See Comments)    Gi bleeding  . Macrobid [Nitrofurantoin] Other (See Comments)    Reaction:  Unknown     REVIEW OF SYSTEMS:   Review of Systems  Constitutional: Positive for malaise/fatigue. Negative for fever, chills and weight loss.  HENT: Negative for hearing loss and nosebleeds.   Eyes: Negative for blurred vision, double vision and pain.  Respiratory: Negative for cough, hemoptysis, sputum production, shortness of breath and wheezing.   Cardiovascular: Negative for chest pain, palpitations,  orthopnea and leg swelling.  Gastrointestinal: Negative for nausea, vomiting, abdominal pain, diarrhea and constipation.  Genitourinary: Negative for dysuria and hematuria.  Musculoskeletal: Negative for myalgias, back pain and falls.  Skin: Negative for rash.  Neurological: Positive for focal weakness (chronic) and weakness. Negative for dizziness, tremors, sensory change, speech change, seizures and headaches.  Endo/Heme/Allergies: Does not bruise/bleed easily.  Psychiatric/Behavioral: Positive for memory loss. Negative for depression. The patient is not nervous/anxious.     MEDICATIONS AT HOME:   Prior to Admission medications   Medication Sig Start Date End Date Taking? Authorizing Provider  acetaminophen (TYLENOL) 650 MG CR tablet Take 650 mg by mouth 3 (three) times daily.     Historical Provider, MD  ALPRAZolam Duanne Moron) 0.25 MG tablet Take 1 tablet (0.25 mg total) by mouth 2 (two) times daily as needed. Patient taking differently: Take 0.25 mg by mouth 2 (two) times daily.  04/05/15   Nicholes Mango, MD  alum & mag hydroxide-simeth (MAALOX/MYLANTA) 200-200-20 MG/5ML suspension Take 30 mLs by mouth every 4 (four) hours as needed for indigestion, heartburn or flatulence.    Historical Provider, MD  aluminum hydroxide [facility]) ointment Apply 1 application topically as needed (to open area on buttocks).    Historical Provider, MD  Ascorbic Acid (VITAMIN C) 1000 MG tablet Take 1,000 mg by mouth daily.    Historical Provider, MD  Calcium Carbonate-Vitamin D (CALCIUM 600+D) 600-400 MG-UNIT per tablet Take 1 tablet by mouth every evening.    Historical Provider, MD  DULoxetine (CYMBALTA) 60 MG capsule Take 60 mg by mouth daily.     Historical Provider, MD  Emollient (CERAVE) CREA Apply 1 application topically every evening.    Historical Provider, MD  feeding supplement, ENSURE ENLIVE, (ENSURE ENLIVE) LIQD Take 237 mLs by mouth 2 (two) times daily between meals. Patient not taking: Reported on  07/06/2015 04/05/15   Nicholes Mango, MD  folic acid (FOLVITE) 1 MG tablet Take 1 mg by mouth daily.    Historical Provider, MD  HYDROcodone-acetaminophen (NORCO/VICODIN) 5-325 MG per tablet Take 1 tablet by mouth every 4 (four) hours as needed for moderate pain.    Historical Provider, MD  hydroxychloroquine (PLAQUENIL) 200 MG tablet Take 200 mg by mouth 2 (two) times daily.    Historical Provider, MD  Hypromellose (ARTIFICIAL TEARS OP) Apply 2 drops to eye 2 (two) times daily as needed (for dry eyes).    Historical Provider, MD  levofloxacin (LEVAQUIN) 250 MG tablet Take 1 tablet (250 mg total) by mouth daily. 07/08/15   Gladstone Lighter, MD  loratadine (CLARITIN) 10 MG tablet Take 10 mg by mouth daily.     Historical Provider, MD  metoprolol succinate (TOPROL-XL) 50 MG 24 hr tablet Take 50 mg by mouth 2 (two) times daily.     Historical Provider, MD  mirtazapine (REMERON) 15 MG tablet Take 15 mg by mouth at bedtime.    Historical Provider, MD  montelukast (SINGULAIR) 10 MG tablet Take 10  mg by mouth every evening.    Historical Provider, MD  Multiple Vitamin (THEREMS) TABS Take 1 tablet by mouth daily.    Historical Provider, MD  omeprazole (PRILOSEC) 20 MG capsule Take 1 capsule (20 mg total) by mouth daily. 07/08/15   Gladstone Lighter, MD  ondansetron (ZOFRAN) 4 MG tablet Take 4 mg by mouth every 4 (four) hours as needed for nausea or vomiting.     Historical Provider, MD  primidone (MYSOLINE) 50 MG tablet Take 50 mg by mouth 2 (two) times daily.     Historical Provider, MD  sennosides-docusate sodium (SENOKOT-S) 8.6-50 MG tablet Take 1 tablet by mouth at bedtime.    Historical Provider, MD  traZODone (DESYREL) 50 MG tablet Take 50 mg by mouth at bedtime.    Historical Provider, MD     VITAL SIGNS:  Blood pressure 93/61, pulse 90, temperature 98 F (36.7 C), temperature source Oral, resp. rate 23, height 5\' 3"  (1.6 m), weight 48.263 kg (106 lb 6.4 oz), SpO2 97 %.  PHYSICAL EXAMINATION:   Physical Exam  GENERAL:  82 y.o.-year-old patient lying in the bed with no acute distress.  EYES: Pupils equal, round, reactive to light and accommodation. No scleral icterus. Extraocular muscles intact.  HEENT: Head atraumatic, normocephalic. Oropharynx and nasopharynx clear. No oropharyngeal erythema, moist oral mucosa  NECK:  Supple, no jugular venous distention. No thyroid enlargement, no tenderness.  LUNGS: Normal breath sounds bilaterally, no wheezing, rales, rhonchi. No use of accessory muscles of respiration.  CARDIOVASCULAR: S1, S2 normal. No murmurs, rubs, or gallops.  ABDOMEN: Soft, nontender, nondistended. Bowel sounds present. No organomegaly or mass.  EXTREMITIES: No pedal edema, cyanosis, or clubbing. + 2 pedal & radial pulses b/l.  Diffuse muscle wasting NEUROLOGIC: Slurred speech. Motor strength 4/5 in upper extremities with contractures. Unable to move lower extremity's. PSYCHIATRIC: The patient is alert and oriented. Flat affect. SKIN: No obvious rash, lesion, or ulcer.   LABORATORY PANEL:   CBC  Recent Labs Lab 03/30/16 1020  WBC 6.3  HGB 11.1*  HCT 32.1*  PLT 408   ------------------------------------------------------------------------------------------------------------------  Chemistries   Recent Labs Lab 03/30/16 1020  NA 123*  K 2.9*  CL 74*  CO2 34*  GLUCOSE 108*  BUN 86*  CREATININE 0.87  CALCIUM 9.7  AST 21  ALT 9*  ALKPHOS 87  BILITOT 0.6   ------------------------------------------------------------------------------------------------------------------  Cardiac Enzymes No results for input(s): TROPONINI in the last 168 hours. ------------------------------------------------------------------------------------------------------------------  RADIOLOGY:  No results found.   IMPRESSION AND PLAN:   * UTI failed outpatient therapy with ciprofloxacin. Dehydration. Patient will be admitted for IV antibiotics. Start Zosyn. Send urine  cultures and blood cultures. Monitor closely as patient is hypotensive. We will resuscitate with fluids. May need to start pressors if continues to be hypotensive. Bolus normal saline STAT.  * Hyponatremia due to dehydration. We'll repeat labs once fluid resuscitated.  * Hypokalemia due to decreased oral intake. Replace orally and repeat labs.  * Weakness due to UTI and dehydration. Patient is bedbound at baseline and gets into wheelchair with assistance.  * DVT prophylaxis with Lovenox.  All the records are reviewed and case discussed with ED provider. Management plans discussed with the patient, family and they are in agreement.  CODE STATUS: DNR  TOTAL CC TIME TAKING CARE OF THIS PATIENT: 40 minutes.   Hillary Bow R M.D on 03/30/2016 at 12:00 PM  Between 7am to 6pm - Pager - [phone]  After 6pm go to [email or web address] - password  [email or web address] [facility]  [place]  Office  [phone]  CC: Primary care physician; Viviana Simpler, MD  Note: This dictation was prepared with Dragon dictation along with smaller phrase technology. Any transcriptional errors that result from this process are unintentional.

## 2016-03-30 NOTE — Care Management Important Message (Signed)
Important Message  Patient Details  Name: Stacey Torres MRN: [id number] Date of Birth: Mar 28, 1929   Medicare Important Message Given:  Yes    Adelaide Pfefferkorn A, RN 03/30/2016, 3:37 PM

## 2016-03-31 ENCOUNTER — Telehealth: Payer: Self-pay

## 2016-03-31 DIAGNOSIS — N390 Urinary tract infection, site not specified: Secondary | ICD-10-CM | POA: Diagnosis not present

## 2016-03-31 DIAGNOSIS — E860 Dehydration: Secondary | ICD-10-CM | POA: Diagnosis not present

## 2016-03-31 LAB — BASIC METABOLIC PANEL
Anion gap: 10 (ref 5–15)
BUN: 61 mg/dL — AB (ref 6–20)
CALCIUM: 9 mg/dL (ref 8.9–10.3)
CO2: 28 mmol/L (ref 22–32)
Chloride: 94 mmol/L — ABNORMAL LOW (ref 101–111)
Creatinine, Ser: 0.64 mg/dL (ref 0.44–1.00)
GFR calc Af Amer: >60 mL/min (ref >60)
GLUCOSE: 95 mg/dL (ref 65–99)
POTASSIUM: 5.1 mmol/L (ref 3.5–5.1)
Sodium: 132 mmol/L — ABNORMAL LOW (ref 135–145)

## 2016-03-31 LAB — CBC
HEMATOCRIT: 31.6 % — AB (ref 35.0–47.0)
Hemoglobin: 10.6 g/dL — ABNORMAL LOW (ref 12.0–16.0)
MCH: 32 pg (ref 26.0–34.0)
MCHC: 33.5 g/dL (ref 32.0–36.0)
MCV: 95.5 fL (ref 80.0–100.0)
Platelets: 396 10*3/uL (ref 150–440)
RBC: 3.31 MIL/uL — ABNORMAL LOW (ref 3.80–5.20)
RDW: 15.2 % — AB (ref 11.5–14.5)
WBC: 4.5 10*3/uL (ref 3.6–11.0)

## 2016-03-31 MED ORDER — SODIUM CHLORIDE 0.9 % IV SOLN
INTRAVENOUS | Status: DC
  Administered 2016-04-01: 01:00:00 via INTRAVENOUS

## 2016-03-31 NOTE — Progress Notes (Signed)
Initial Nutrition Assessment  DOCUMENTATION CODES:   Non-severe (moderate) malnutrition in context of chronic illness  INTERVENTION:  -Monitor intake, recommend dysphagia 3 diet secondary to pt with limited teeth and difficulty chewing -Recommend magic cup BID in addition to ensure enlive for added nutrition   NUTRITION DIAGNOSIS:   Malnutrition related to chronic illness as evidenced by moderate depletion of body fat, moderate depletions of muscle mass.    GOAL:   Patient will meet greater than or equal to 90% of their needs    MONITOR:   PO intake, Supplement acceptance  REASON FOR ASSESSMENT:   Consult, Malnutrition Screening Tool Poor PO  ASSESSMENT:    Pt admitted with UTI, dehydration, FTT, hyponatremia, hypokalemia  Past Medical History  Diagnosis Date  . Cerebral palsy, hemiplegic ([place])     pt is intelligant  . Neurologic gait dysfunction   . Spinal stenosis   . Urge urinary incontinence   . Hypertension   . Arthritis   . Neurogenic bladder   . Hydronephrosis, right   . Chronic cystitis   . Gross hematuria   . RA (rheumatoid arthritis) (HCC)     advanced  . Flexion contracture joint of multiple sites     secondary to advanced ra  . Slurred speech   . Cancer ([place]) skin ca  . Seasonal allergies   . UTI (lower urinary tract infection)   . Sepsis due to urinary tract infection ([place])   . Cough   . RA (rheumatoid arthritis) ([place])   . Edema, lower extremity   . Cataract    Pt reports appetite has been down for about 2 weeks prior to admission.  Ensure at bedside with few sips taken.  Requesting water and provided for pt.  Pt reports " I don't like the food at where I live."   Medications reviewed: folic acid, remeron, senokot, duloclax  Labs reviewed: Na 132  Nutrition-Focused physical exam completed. Findings are moderate fat depletion, moderate to severe muscle depletion, and unable to assess edema.    Diet Order:  Diet regular Room service  appropriate?: Yes; Fluid consistency:: Thin  Skin:  Reviewed, no issues  Last BM:  5/22  Height:   Ht Readings from Last 1 Encounters:  03/30/16 5\' 3"  (1.6 m)    Weight: 9% wt loss in the last year per wt encounters  Wt Readings from Last 1 Encounters:  03/31/16 102 lb (46.267 kg)    Ideal Body Weight:     BMI:  Body mass index is 18.07 kg/(m^2).  Estimated Nutritional Needs:   Kcal:  [id number] kcals/d.   Protein:  69-80 g/d  Fluid:  1.3-1.6 L/d  EDUCATION NEEDS:   No education needs identified at this time  Tametra Ahart B. Zenia Resides, [place], [place] (pager) Weekend/On-Call pager [phone])

## 2016-03-31 NOTE — Telephone Encounter (Signed)
Will follow along with her progress there

## 2016-03-31 NOTE — Clinical Social Work Note (Signed)
Clinical Social Work Assessment  Patient Details  Name: Stacey Torres MRN: [id number] Date of Birth: 12/20/1928  Date of referral:  03/31/16               Reason for consult:  Other (Comment Required) (From [facility] )                Permission sought to share information with:  Chartered certified accountant granted to share information::  Yes, Verbal Permission Granted  Name::      Retail buyer::   [place]   Relationship::     Contact Information:     Housing/Transportation Living arrangements for the past 2 months:  [place] of Information:  Patient, Other (Comment Required) Patient Interpreter Needed:  None Criminal Activity/Legal Involvement Pertinent to Current Situation/Hospitalization:  No - Comment as needed Significant Relationships:  Other Family Members Lives with:  Facility Resident Do you feel safe going back to the place where you live?  Yes Need for family participation in patient care:  Yes (Comment)  Care giving concerns:  Patient is a long term care resident at [facility].   Social Worker assessment / plan:  Holiday representative ([place]) received consult that patient is from [facility]. Per Seth Bake admissions coordinator at [facility] patient is a long term care resident at [facility] and can return when stable. CSW met with patient and her niece Stacey Torres [phone] was at bedside. CSW introduced self and explained role of CSW department. Patient was alert and oriented and was laying in the bed. Patient and niece Stacey Torres are agreeable for patient to return to [facility]. Per Pam patient's brother Stacey Torres is patient's HPOA and he lives in [place]. Pam is the closest to patient and assists Marcello Moores when he needs to [employer] Per Pam patient does not have any children.  FL2 complete and faxed out. CSW will continue to follow and assist as needed.   Employment status:  Retired Designer, industrial/product PT Recommendations:  Not assessed at this time Information / Referral to community resources:  [place]  Patient/Family's Response to care:  Patient and niece are agreeable for patient to return to [facility]   Patient/Family's Understanding of and Emotional Response to Diagnosis, Current Treatment, and Prognosis:  Patient was pleasant and thanked CSW for visit.   Emotional Assessment Appearance:  Appears stated age Attitude/Demeanor/Rapport:    Affect (typically observed):  Accepting, Adaptable, Pleasant Orientation:  Oriented to Self, Oriented to Place, Oriented to  Time, Oriented to Situation Alcohol / Substance use:  Not Applicable Psych involvement (Current and /or in the community):  No (Comment)  Discharge Needs  Concerns to be addressed:  Discharge Planning Concerns Readmission within the last 30 days:  No Current discharge risk:  Dependent with Mobility, Chronically ill Barriers to Discharge:  Continued Medical Work up   Elwyn Reach 03/31/2016, 4:39 PM

## 2016-03-31 NOTE — Telephone Encounter (Signed)
Per chart review tab pt is currently admitted to [facility].

## 2016-03-31 NOTE — Progress Notes (Signed)
[place] at [place] NAME: Chestina Halgren    MR#:  [id number]  DATE OF BIRTH:  [birth year]  SUBJECTIVE:  CHIEF COMPLAINT:   Chief Complaint  Patient presents with  . Urinary Tract Infection  . Dehydration  . Failure To Thrive  Afebrile. No dysuria. No shortness of breath. Still feels weak.  REVIEW OF SYSTEMS:    Review of Systems  Constitutional: Positive for malaise/fatigue. Negative for fever and chills.  HENT: Negative for sore throat.   Eyes: Negative for blurred vision, double vision and pain.  Respiratory: Negative for cough, hemoptysis, shortness of breath and wheezing.   Cardiovascular: Negative for chest pain, palpitations, orthopnea and leg swelling.  Gastrointestinal: Negative for heartburn, nausea, vomiting, abdominal pain, diarrhea and constipation.  Genitourinary: Negative for dysuria and hematuria.  Musculoskeletal: Negative for back pain and joint pain.  Skin: Negative for rash.  Neurological: Positive for weakness. Negative for sensory change, speech change, focal weakness and headaches.  Endo/Heme/Allergies: Does not bruise/bleed easily.  Psychiatric/Behavioral: Negative for depression. The patient is not nervous/anxious.     DRUG ALLERGIES:   Allergies  Allergen Reactions  . Aspirin Other (See Comments)    Gi bleeding  . Macrobid [Nitrofurantoin] Other (See Comments)    Reaction:  Unknown     VITALS:  Blood pressure 109/60, pulse 101, temperature 97.5 F (36.4 C), temperature source Oral, resp. rate 20, height 5\' 3"  (1.6 m), weight 46.267 kg (102 lb), SpO2 98 %.  PHYSICAL EXAMINATION:   Physical Exam  GENERAL:  82 y.o.-year-old patient lying in the bed with no acute distress.  EYES: Pupils equal, round, reactive to light and accommodation. No scleral icterus. Extraocular muscles intact.  HEENT: Head atraumatic, normocephalic. Oropharynx and nasopharynx clear.  NECK:  Supple, no jugular  venous distention. No thyroid enlargement, no tenderness.  LUNGS: Normal breath sounds bilaterally, no wheezing, rales, rhonchi. No use of accessory muscles of respiration.  CARDIOVASCULAR: S1, S2 normal. No murmurs, rubs, or gallops.  ABDOMEN: Soft, nontender, nondistended. Bowel sounds present. No organomegaly or mass.  EXTREMITIES: No cyanosis, clubbing or edema b/l.    NEUROLOGIC: Slurred speech which is chronic. Motor strength 0/5 in lower extremities and 3/5 in upper extremities. He has contractures in all extremities. PSYCHIATRIC: The patient is alert and Awake. SKIN: No obvious rash, lesion, or ulcer.   LABORATORY PANEL:   CBC  Recent Labs Lab 03/31/16 0433  WBC 4.5  HGB 10.6*  HCT 31.6*  PLT 396   ------------------------------------------------------------------------------------------------------------------ Chemistries   Recent Labs Lab 03/30/16 1020 03/31/16 0433  NA 123* 132*  K 2.9* 5.1  CL 74* 94*  CO2 34* 28  GLUCOSE 108* 95  BUN 86* 61*  CREATININE 0.87 0.64  CALCIUM 9.7 9.0  AST 21  --   ALT 9*  --   ALKPHOS 87  --   BILITOT 0.6  --    ------------------------------------------------------------------------------------------------------------------  Cardiac Enzymes No results for input(s): TROPONINI in the last 168 hours. ------------------------------------------------------------------------------------------------------------------  RADIOLOGY:  No results found.   ASSESSMENT AND PLAN:   * UTI failed outpatient therapy with ciprofloxacin.  Started on IV Zosyn due to failed therapy with ciprofloxacin. Urine blood cultures pending. Patient seems better today.  * Hyponatremia due to dehydration.  Improving with IV fluid. Lower normal saline to 50 ML per hour.  * Hypokalemia due to decreased oral intake. Resolved  * Weakness due to UTI and dehydration. Patient is bedbound at baseline and gets into  wheelchair with assistance.  * DVT  prophylaxis with Lovenox.  All the records are reviewed and case discussed with Care Management/Social Workerr. Management plans discussed with the patient, family and they are in agreement.  CODE STATUS: DNR  DVT Prophylaxis: SCDs  TOTAL TIME TAKING CARE OF THIS PATIENT: 30 minutes.   POSSIBLE D/C IN 1-2 DAYS, DEPENDING ON CLINICAL CONDITION.  Hillary Bow R M.D on 03/31/2016 at 1:30 PM  Between 7am to 6pm - Pager - [phone]  After 6pm go to [email or web address] - password [email or web address] [facility]  Office  [phone]  CC: Primary care physician; Viviana Simpler, MD  Note: This dictation was prepared with Dragon dictation along with smaller phrase technology. Any transcriptional errors that result from this process are unintentional.

## 2016-03-31 NOTE — NC FL2 (Signed)
[place] LEVEL OF CARE SCREENING TOOL     IDENTIFICATION  Patient Name: Stacey Torres Birthdate: [birth year] Sex: female Admission Date (Current Location): 03/30/2016  [place] and [place] Number:  Engineering geologist and Address:  [facility], [address] [place], [place] [address]      Provider Number: [id number]  Attending Physician Name and Address:  Hillary Bow, MD  Relative Name and Phone Number:       Current Level of Care: Hospital Recommended Level of Care: [place] Prior Approval Number:    Date Approved/Denied:   PASRR Number:  ( [id number] )  Discharge Plan: SNF    Current Diagnoses: Patient Active Problem List   Diagnosis Date Noted  . UTI (lower urinary tract infection) 03/30/2016  . Protein-calorie malnutrition, severe ([place]) 07/07/2015  . Sepsis ([place]) 07/06/2015  . Upper GI bleed 07/06/2015  . Hyperkalemia 07/06/2015  . Endometrial thickening on ultra sound   . Malnutrition of moderate degree ([place]) 04/02/2015  . Rheumatoid arthritis ([place]) 04/01/2015  . Essential hypertension 04/01/2015  . Hydronephrosis of right kidney 11/21/2013    Orientation RESPIRATION BLADDER Height & Weight     Self, Time, Situation, Place  Normal Incontinent Weight: 102 lb (46.267 kg) Height:  5\' 3"  (160 cm)  BEHAVIORAL SYMPTOMS/MOOD NEUROLOGICAL BOWEL NUTRITION STATUS   (none )  (none ) Incontinent Diet (Diet: DYS 3 )  AMBULATORY STATUS COMMUNICATION OF NEEDS Skin   Extensive Assist Verbally Normal                       Personal Care Assistance Level of Assistance  Bathing, Feeding, Dressing Bathing Assistance: Limited assistance Feeding assistance: Independent Dressing Assistance: Limited assistance     Functional Limitations Info  Sight, Hearing, Speech Sight Info: Adequate Hearing Info: Adequate Speech Info: Adequate    SPECIAL CARE FACTORS FREQUENCY  PT (By licensed PT), OT (By  licensed OT)     PT Frequency:  (5) OT Frequency:  (5)            Contractures      Additional Factors Info  Code Status, Allergies Code Status Info:  (DNR ) Allergies Info:  (Aspirin, Macrobid. )           Current Medications (03/31/2016):  This is the current hospital active medication list Current Facility-Administered Medications  Medication Dose Route Frequency Provider Last Rate Last Dose  . 0.9 %  sodium chloride infusion   Intravenous Continuous Hillary Bow, MD 50 mL/hr at 03/31/16 1342    . acetaminophen (TYLENOL) tablet 650 mg  650 mg Oral Q6H PRN Hillary Bow, MD       Or  . acetaminophen (TYLENOL) suppository 650 mg  650 mg Rectal Q6H PRN Srikar Sudini, MD      . albuterol (PROVENTIL) (2.5 MG/3ML) 0.083% nebulizer solution 2.5 mg  2.5 mg Nebulization Q2H PRN Srikar Sudini, MD      . artificial tears (LACRILUBE) ophthalmic ointment   Both Eyes QHS PRN Srikar Sudini, MD      . bisacodyl (DULCOLAX) EC tablet 5 mg  5 mg Oral Daily PRN Srikar Sudini, MD      . docusate sodium (COLACE) capsule 100 mg  100 mg Oral BID Hillary Bow, MD   100 mg at 03/31/16 0933  . [START ON 04/01/2016] DULoxetine (CYMBALTA) DR capsule 60 mg  60 mg Oral Daily Srikar Sudini, MD      . enoxaparin (LOVENOX) injection  40 mg  40 mg Subcutaneous Q24H Hillary Bow, MD   40 mg at 03/30/16 2225  . feeding supplement (ENSURE ENLIVE) (ENSURE ENLIVE) liquid 237 mL  237 mL Oral BID BM Srikar Sudini, MD   237 mL at 03/31/16 1413  . fluticasone (FLONASE) 50 MCG/ACT nasal spray 2 spray  2 spray Each Nare BID Hillary Bow, MD   2 spray at 03/31/16 0941  . folic acid (FOLVITE) tablet 1 mg  1 mg Oral Daily Hillary Bow, MD   1 mg at 03/31/16 0933  . HYDROcodone-acetaminophen (NORCO/VICODIN) 5-325 MG per tablet 1-2 tablet  1-2 tablet Oral Q4H PRN Hillary Bow, MD   1 tablet at 03/31/16 1621  . HYDROcodone-acetaminophen (NORCO/VICODIN) 5-325 MG per tablet 2 tablet  2 tablet Oral Daily Hillary Bow, MD   2  tablet at 03/31/16 0933  . hydroxychloroquine (PLAQUENIL) tablet 200 mg  200 mg Oral BID Hillary Bow, MD   200 mg at 03/31/16 0933  . metoprolol succinate (TOPROL-XL) 24 hr tablet 50 mg  50 mg Oral BID Srikar Sudini, MD      . mirtazapine (REMERON) tablet 15 mg  15 mg Oral QHS Hillary Bow, MD   15 mg at 03/30/16 2225  . montelukast (SINGULAIR) tablet 10 mg  10 mg Oral QPM Srikar Sudini, MD   10 mg at 03/31/16 1621  . ondansetron (ZOFRAN) tablet 4 mg  4 mg Oral Q6H PRN Hillary Bow, MD       Or  . ondansetron (ZOFRAN) injection 4 mg  4 mg Intravenous Q6H PRN Srikar Sudini, MD      . pantoprazole (PROTONIX) EC tablet 40 mg  40 mg Oral Daily Hillary Bow, MD   40 mg at 03/31/16 0933  . piperacillin-tazobactam (ZOSYN) IVPB 3.375 g  3.375 g Intravenous Q8H Srikar Sudini, MD 12.5 mL/hr at 03/31/16 1404 3.375 g at 03/31/16 1404  . polyethylene glycol (MIRALAX / GLYCOLAX) packet 17 g  17 g Oral Daily PRN Srikar Sudini, MD      . primidone (MYSOLINE) tablet 50 mg  50 mg Oral BID Hillary Bow, MD   50 mg at 03/31/16 0933  . senna-docusate (Senokot-S) tablet 1 tablet  1 tablet Oral QHS Hillary Bow, MD   1 tablet at 03/30/16 2225  . sodium phosphate (FLEET) 7-19 GM/118ML enema 1 enema  1 enema Rectal Once PRN Hillary Bow, MD         Discharge Medications: Please see discharge summary for a list of discharge medications.  Relevant Imaging Results:  Relevant Lab Results:   Additional Information  (SSN: [id number])  Loralyn Freshwater, LCSW

## 2016-03-31 NOTE — Telephone Encounter (Signed)
PLEASE NOTE: All timestamps contained within this report are represented as [place] Standard Time. CONFIDENTIALTY NOTICE: This fax transmission is intended only for the addressee. It contains information that is legally privileged, confidential or otherwise protected from use or disclosure. If you are not the intended recipient, you are strictly prohibited from reviewing, disclosing, copying using or disseminating any of this information or taking any action in reliance on or regarding this information. If you have received this fax in error, please notify us immediately by telephone so that we can arrange for its return to [place]. Phone: [phone], Toll-Free: [phone], Fax: [phone] Page: 1 of 1 Call Id: [id number] [place] Night - Client Nonclinical Telephone Record [place] Night - Client Client Site [place] Physician Viviana Simpler - MD Contact Type Call Who Is Calling Physician / Provider / Hospital Call Type Provider Call Center For Change Page Now Reason for Call Request to speak to Physician Initial Comment Calling to speak with phys or on-call Additional Comment Patient Name Stacey Torres Patient DOB 30-Jul-1929 Requesting Provider Jaymes Graff Physician Number [place] Name [place] Phone DateTime Result/Outcome Message Type Notes Thersa Salt - DO [id number] 03/29/2016 4:53:23 PM Called On Call Provider - Reached Doctor Paged [place], [place] 03/29/2016 4:53:26 PM Spoke with On Call - General Message Result Call Closed By: Honor Loh Transaction Date/Time: 03/29/2016 4:40:47 PM (ET)

## 2016-04-01 DIAGNOSIS — E860 Dehydration: Secondary | ICD-10-CM | POA: Diagnosis not present

## 2016-04-01 DIAGNOSIS — N390 Urinary tract infection, site not specified: Secondary | ICD-10-CM | POA: Diagnosis not present

## 2016-04-01 LAB — MRSA PCR SCREENING: MRSA BY PCR: NEGATIVE

## 2016-04-01 MED ORDER — CEFUROXIME AXETIL 250 MG PO TABS: 250.0000 mg | ORAL_TABLET | Freq: Two times a day (BID) | ORAL | Status: DC

## 2016-04-01 NOTE — Progress Notes (Signed)
Patient is medically stable for D/C back to [facility] today. Per Seth Bake admissions coordinator at [facility] patient will go to room 325. RN will call report at [phone] and arrange EMS for transport. Clinical Education officer, museum (CSW) sent D/C Summary, FL2 and D/C Packet to [employer] via [employer]. Patient is aware of above. CSW contacted patient's niece Pam and made her aware of above. Please reconsult if future social work needs arise. CSW signing off.   Blima Rich, LCSW [phone]

## 2016-04-01 NOTE — Progress Notes (Signed)
Checked to see if pre-authorization would be needed for non-emergent EMS transport. Per [employer] benefits obtained online through [employer], patient has a [employer] Medicare Advantage PPO policy.  Medicare PPO plans do not require pre-auth for non-emergent ground transports using service codes A0426 or A0428.   

## 2016-04-01 NOTE — Consult Note (Signed)
   [facility] Inpatient Consult   04/01/2016  Stacey Torres [birth year] [id number]   Patient screened for potential [facility] services. Patient is eligible for [place]. Electronic medical record reveals patient's discharge plan is to return to skilled nursing facility and there were no identifiable [facility] care management needs at this time. [facility] Care Management services not appropriate at this time. If patient's post hospital needs change please place a [facility] Care Management consult. For questions please contact:   Akhila Mahnken RN, [place] [facility] Liaison  [phone]) Business Mobile [phone]) Toll free office

## 2016-04-01 NOTE — Discharge Planning (Addendum)
Pt IV removed.  Pt DC papers printed and copy sent in packet sent to facility [facility]).  Report called to Gara Kroner, RN.  RN assessment and VS revealed stability for DC to facility. Pam (neice) called to inform of pt being picked up. EMS contacted for transport.

## 2016-04-01 NOTE — Discharge Summary (Signed)
[place] [facility] [place] NAME: Stacey Torres    MR#:  [id number]  DATE OF BIRTH:  12-09-1928  DATE OF ADMISSION:  03/30/2016 ADMITTING PHYSICIAN: Hillary Bow, MD  DATE OF DISCHARGE: No discharge date for patient encounter.  PRIMARY CARE PHYSICIAN: Viviana Simpler, MD   ADMISSION DIAGNOSIS:  Hyponatremia [E87.1] UTI (lower urinary tract infection) [N39.0] Therapy failure due to antibiotic resistance [Z16.20]  DISCHARGE DIAGNOSIS:  Active Problems:   UTI (lower urinary tract infection)   SECONDARY DIAGNOSIS:   Past Medical History  Diagnosis Date  . Cerebral palsy, hemiplegic ([place])     pt is intelligant  . Neurologic gait dysfunction   . Spinal stenosis   . Urge urinary incontinence   . Hypertension   . Arthritis   . Neurogenic bladder   . Hydronephrosis, right   . Chronic cystitis   . Gross hematuria   . RA (rheumatoid arthritis) ([facility])     advanced  . Flexion contracture joint of multiple sites     secondary to advanced ra  . Slurred speech   . Cancer ([place]) skin ca  . Seasonal allergies   . UTI (lower urinary tract infection)   . Sepsis due to urinary tract infection ([place])   . Cough   . RA (rheumatoid arthritis) ([place])   . Edema, lower extremity   . Cataract      ADMITTING HISTORY  Stacey Torres is a 82 y.o. female with a known history of Cerebral palsy, urinary incontinence, bedbound status presents to the emergency room due to weakness and dehydration. Patient was treated with ciprofloxacin for 7 days with no improvement for UTI. Cultures not available. In the past patient had Klebsiella and Pseudomonas which were pansensitive. She has no abdominal pain. Feels lightheaded. Has chronic slurred speech. Bed bound due to cerebral palsy. Gets into wheelchair with assistance. Here patient has been found to be hypotensive with blood pressure of 88/60. Hyponatremia. Significantly elevated BUN of 80 and normal  creatinine.  HOSPITAL COURSE:   * UTI failed outpatient therapy with ciprofloxacin - E Coli Started on IV Zosyn due to failed therapy with ciprofloxacin. Now afebrile. Normal WBC. We'll change to cefuroxime at discharge.  * Hyponatremia due to dehydration Improved with IV fluid.   * Hypokalemia due to decreased oral intake. Resolved  * Weakness due to UTI and dehydration. Patient is bedbound at baseline and gets into wheelchair with assistance.  * DVT prophylaxis with Lovenox.  Stable for discharge back to nursing home on oral antibiotics for 3 more days.  CONSULTS OBTAINED:     DRUG ALLERGIES:   Allergies  Allergen Reactions  . Aspirin Other (See Comments)    Gi bleeding  . Macrobid [Nitrofurantoin] Other (See Comments)    Reaction:  Unknown     DISCHARGE MEDICATIONS:   Current Discharge Medication List    START taking these medications   Details  cefUROXime (CEFTIN) 250 MG tablet Take 1 tablet (250 mg total) by mouth 2 (two) times daily with a meal. Qty: 6 tablet, Refills: 0      CONTINUE these medications which have NOT CHANGED   Details  acetaminophen (TYLENOL) 650 MG CR tablet Take 650 mg by mouth 3 (three) times daily.     ALPRAZolam (XANAX) 0.25 MG tablet Take 1 tablet (0.25 mg total) by mouth 2 (two) times daily as needed. Qty: 10 tablet, Refills: 0    Ascorbic Acid (VITAMIN C) 1000 MG tablet Take 1,000  mg by mouth daily.    Calcium Carbonate-Vitamin D (CALCIUM 600+D) 600-400 MG-UNIT per tablet Take 1 tablet by mouth every evening.    DULoxetine (CYMBALTA) 60 MG capsule Take 60 mg by mouth daily.     fluticasone (FLONASE) 50 MCG/ACT nasal spray Place 2 sprays into both nostrils 2 (two) times daily.    folic acid (FOLVITE) 1 MG tablet Take 1 mg by mouth daily.    HYDROcodone-acetaminophen (NORCO/VICODIN) 5-325 MG per tablet Take 2 tablets by mouth daily.     hydroxychloroquine (PLAQUENIL) 200 MG tablet Take 200 mg by mouth 2 (two) times daily.     Hypromellose (ARTIFICIAL TEARS OP) Apply 2 drops to eye 2 (two) times daily as needed (for dry eyes).    loratadine (CLARITIN) 10 MG tablet Take 10 mg by mouth daily.     methotrexate (RHEUMATREX) 2.5 MG tablet Take 15 mg by mouth once a week. On Friday    metoprolol succinate (TOPROL-XL) 50 MG 24 hr tablet Take 50 mg by mouth 2 (two) times daily.     mirtazapine (REMERON) 15 MG tablet Take 15 mg by mouth at bedtime.    montelukast (SINGULAIR) 10 MG tablet Take 10 mg by mouth every evening.    Multiple Vitamin (THEREMS) TABS Take 1 tablet by mouth daily.    omeprazole (PRILOSEC) 20 MG capsule Take 1 capsule (20 mg total) by mouth daily. Qty: 60 capsule, Refills: 0    ondansetron (ZOFRAN) 4 MG tablet Take 4 mg by mouth every 4 (four) hours as needed for nausea or vomiting.     polyethylene glycol (MIRALAX / GLYCOLAX) packet Take 17 g by mouth daily as needed.    primidone (MYSOLINE) 50 MG tablet Take 50 mg by mouth 2 (two) times daily.     sennosides-docusate sodium (SENOKOT-S) 8.6-50 MG tablet Take 1 tablet by mouth at bedtime.    traZODone (DESYREL) 50 MG tablet Take 50 mg by mouth at bedtime.    feeding supplement, ENSURE ENLIVE, (ENSURE ENLIVE) LIQD Take 237 mLs by mouth 2 (two) times daily between meals. Qty: 237 mL, Refills: 12      STOP taking these medications     ciprofloxacin (CIPRO) 250 MG tablet      levofloxacin (LEVAQUIN) 250 MG tablet         Today   VITAL SIGNS:  Blood pressure 146/55, pulse 88, temperature 97.6 F (36.4 C), temperature source Oral, resp. rate 17, height 5\' 3"  (1.6 m), weight 48.49 kg (106 lb 14.4 oz), SpO2 100 %.  I/O:   Intake/Output Summary (Last 24 hours) at 04/01/16 1409 Last data filed at 04/01/16 0845  Gross per 24 hour  Intake 898.34 ml  Output      0 ml  Net 898.34 ml    PHYSICAL EXAMINATION:  Physical Exam  GENERAL:  82 y.o.-year-old patient lying in the bed with no acute distress.  LUNGS: Normal breath sounds  bilaterally, no wheezing, rales,rhonchi or crepitation. No use of accessory muscles of respiration.  CARDIOVASCULAR: S1, S2 normal. No murmurs, rubs, or gallops.  ABDOMEN: Soft, non-tender, non-distended. Bowel sounds present. No organomegaly or mass.  NEUROLOGIC: Motor stregth LE 0/5. Upper extremities - 3/5 with contractures PSYCHIATRIC: The patient is alert and awake. Slurred speech, chronic  DATA REVIEW:   CBC  Recent Labs Lab 03/31/16 0433  WBC 4.5  HGB 10.6*  HCT 31.6*  PLT 396    Chemistries   Recent Labs Lab 03/30/16 1020 03/31/16 0433  NA 123*  132*  K 2.9* 5.1  CL 74* 94*  CO2 34* 28  GLUCOSE 108* 95  BUN 86* 61*  CREATININE 0.87 0.64  CALCIUM 9.7 9.0  AST 21  --   ALT 9*  --   ALKPHOS 87  --   BILITOT 0.6  --     Cardiac Enzymes No results for input(s): TROPONINI in the last 168 hours.  Microbiology Results  Results for orders placed or performed during the hospital encounter of 03/30/16  Urine culture     Status: Abnormal (Preliminary result)   Collection Time: 03/30/16 10:23 AM  Result Value Ref Range Status   Specimen Description URINE, RANDOM  Final   Special Requests NONE  Final   Culture (A)  Final    >=100,000 COLONIES/mL GRAM NEGATIVE RODS IDENTIFICATION AND SUSCEPTIBILITIES TO FOLLOW Performed at [facility]    Report Status PENDING  Incomplete  Urine culture     Status: Abnormal (Preliminary result)   Collection Time: 03/30/16 10:23 AM  Result Value Ref Range Status   Specimen Description URINE, RANDOM  Final   Special Requests Normal  Final   Culture (A)  Final    >=100,000 COLONIES/mL ESCHERICHIA COLI SUSCEPTIBILITIES TO FOLLOW Performed at [facility]    Report Status PENDING  Incomplete  MRSA PCR Screening     Status: None   Collection Time: 04/01/16  1:40 AM  Result Value Ref Range Status   MRSA by PCR NEGATIVE NEGATIVE Final    Comment:        The GeneXpert MRSA Assay (FDA approved for NASAL  specimens only), is one component of a comprehensive MRSA colonization surveillance program. It is not intended to diagnose MRSA infection nor to guide or monitor treatment for MRSA infections.     RADIOLOGY:  No results found.  Follow up with PCP in 1 week.  Management plans discussed with the patient, family and they are in agreement.  CODE STATUS:     Code Status Orders        Start     Ordered   03/30/16 1159  Do not attempt resuscitation (DNR)   Continuous    Question Answer Comment  In the event of cardiac or respiratory ARREST Do not call a "code blue"   In the event of cardiac or respiratory ARREST Do not perform Intubation, CPR, defibrillation or ACLS   In the event of cardiac or respiratory ARREST Use medication by any route, position, wound care, and other measures to relive pain and suffering. May use oxygen, suction and manual treatment of airway obstruction as needed for comfort.      03/30/16 1159    Code Status History    Date Active Date Inactive Code Status Order ID Comments User Context   07/06/2015 10:04 PM 07/08/2015  7:05 PM DNR [id number]  Lytle Butte, MD ED   04/01/2015 10:08 PM 04/05/2015  5:54 PM DNR [id number]  Lytle Butte, MD ED    Advance Directive Documentation        Most Recent Value   Type of Advance Directive  Out of facility DNR (pink MOST or yellow form)   Pre-existing out of facility DNR order (yellow form or pink MOST form)  Physician notified to receive inpatient order, Yellow form placed in chart (order not valid for inpatient use)   "MOST" Form in Place?        TOTAL TIME TAKING CARE OF THIS PATIENT ON DAY OF DISCHARGE: more than  30 minutes.   Hillary Bow R M.D on 04/01/2016 at 2:09 PM  Between 7am to 6pm - Pager - [phone]  After 6pm go to [email or web address] - password [email or web address] [place] [facility]  Office  [phone]  CC: Primary care physician; Viviana Simpler, MD  Note: This dictation was prepared with  Dragon dictation along with smaller phrase technology. Any transcriptional errors that result from this process are unintentional.

## 2016-04-01 NOTE — Discharge Instructions (Signed)
°  DIET:  Regular diet  DISCHARGE CONDITION:  Stable  ACTIVITY:  Activity as tolerated  OXYGEN:  Home Oxygen: No.   Oxygen Delivery: room air  DISCHARGE LOCATION:  nursing home   If you experience worsening of your admission symptoms, develop shortness of breath, life threatening emergency, suicidal or homicidal thoughts you must seek medical attention immediately by calling 911 or calling your MD immediately  if symptoms less severe.  You Must read complete instructions/literature along with all the possible adverse reactions/side effects for all the Medicines you take and that have been prescribed to you. Take any new Medicines after you have completely understood and accpet all the possible adverse reactions/side effects.   Please note  You were cared for by a hospitalist during your hospital stay. If you have any questions about your discharge medications or the care you received while you were in the hospital after you are discharged, you can call the unit and asked to speak with the hospitalist on call if the hospitalist that took care of you is not available. Once you are discharged, your primary care physician will handle any further medical issues. Please note that NO REFILLS for any discharge medications will be authorized once you are discharged, as it is imperative that you return to your primary care physician (or establish a relationship with a primary care physician if you do not have one) for your aftercare needs so that they can reassess your need for medications and monitor your lab values.

## 2016-04-02 LAB — URINE CULTURE
Culture: >=100000 — AB
Special Requests: NORMAL

## 2016-04-03 DIAGNOSIS — N390 Urinary tract infection, site not specified: Secondary | ICD-10-CM | POA: Diagnosis not present

## 2016-04-03 DIAGNOSIS — E871 Hypo-osmolality and hyponatremia: Secondary | ICD-10-CM | POA: Diagnosis not present

## 2016-04-08 DIAGNOSIS — B372 Candidiasis of skin and nail: Secondary | ICD-10-CM

## 2016-04-08 DIAGNOSIS — E441 Mild protein-calorie malnutrition: Secondary | ICD-10-CM | POA: Diagnosis not present

## 2016-04-14 DIAGNOSIS — E440 Moderate protein-calorie malnutrition: Secondary | ICD-10-CM | POA: Diagnosis not present

## 2016-04-19 ENCOUNTER — Encounter: Payer: Self-pay | Admitting: Emergency Medicine

## 2016-04-19 ENCOUNTER — Emergency Department: Payer: Medicare Other

## 2016-04-19 ENCOUNTER — Inpatient Hospital Stay
Admission: EM | Admit: 2016-04-19 | Discharge: 2016-04-22 | DRG: 871 | Disposition: A | Discharge status: Skilled Nursing Facility | Payer: Medicare Other | Attending: Internal Medicine | Admitting: Internal Medicine

## 2016-04-19 DIAGNOSIS — E876 Hypokalemia: Secondary | ICD-10-CM | POA: Diagnosis present

## 2016-04-19 DIAGNOSIS — N3941 Urge incontinence: Secondary | ICD-10-CM | POA: Diagnosis present

## 2016-04-19 DIAGNOSIS — A4189 Other specified sepsis: Secondary | ICD-10-CM | POA: Diagnosis present

## 2016-04-19 DIAGNOSIS — B964 Proteus (mirabilis) (morganii) as the cause of diseases classified elsewhere: Secondary | ICD-10-CM | POA: Diagnosis present

## 2016-04-19 DIAGNOSIS — E860 Dehydration: Secondary | ICD-10-CM | POA: Diagnosis present

## 2016-04-19 DIAGNOSIS — A419 Sepsis, unspecified organism: Secondary | ICD-10-CM | POA: Diagnosis present

## 2016-04-19 DIAGNOSIS — N390 Urinary tract infection, site not specified: Secondary | ICD-10-CM | POA: Diagnosis present

## 2016-04-19 DIAGNOSIS — L89152 Pressure ulcer of sacral region, stage 2: Secondary | ICD-10-CM | POA: Diagnosis present

## 2016-04-19 DIAGNOSIS — G808 Other cerebral palsy: Secondary | ICD-10-CM | POA: Diagnosis present

## 2016-04-19 DIAGNOSIS — L89301 Pressure ulcer of unspecified buttock, stage 1: Secondary | ICD-10-CM | POA: Diagnosis present

## 2016-04-19 DIAGNOSIS — N1330 Unspecified hydronephrosis: Secondary | ICD-10-CM | POA: Diagnosis present

## 2016-04-19 DIAGNOSIS — Z681 Body mass index (BMI) 19 or less, adult: Secondary | ICD-10-CM | POA: Diagnosis not present

## 2016-04-19 DIAGNOSIS — A499 Bacterial infection, unspecified: Secondary | ICD-10-CM

## 2016-04-19 DIAGNOSIS — E43 Unspecified severe protein-calorie malnutrition: Secondary | ICD-10-CM | POA: Diagnosis present

## 2016-04-19 DIAGNOSIS — Z87440 Personal history of urinary (tract) infections: Secondary | ICD-10-CM | POA: Diagnosis not present

## 2016-04-19 DIAGNOSIS — M4800 Spinal stenosis, site unspecified: Secondary | ICD-10-CM | POA: Diagnosis present

## 2016-04-19 DIAGNOSIS — Z803 Family history of malignant neoplasm of breast: Secondary | ICD-10-CM

## 2016-04-19 DIAGNOSIS — N319 Neuromuscular dysfunction of bladder, unspecified: Secondary | ICD-10-CM | POA: Diagnosis present

## 2016-04-19 DIAGNOSIS — Z96651 Presence of right artificial knee joint: Secondary | ICD-10-CM | POA: Diagnosis present

## 2016-04-19 DIAGNOSIS — Z1612 Extended spectrum beta lactamase (ESBL) resistance: Secondary | ICD-10-CM

## 2016-04-19 DIAGNOSIS — E871 Hypo-osmolality and hyponatremia: Secondary | ICD-10-CM | POA: Diagnosis present

## 2016-04-19 DIAGNOSIS — N134 Hydroureter: Secondary | ICD-10-CM | POA: Diagnosis present

## 2016-04-19 DIAGNOSIS — Z7401 Bed confinement status: Secondary | ICD-10-CM

## 2016-04-19 DIAGNOSIS — I10 Essential (primary) hypertension: Secondary | ICD-10-CM | POA: Diagnosis present

## 2016-04-19 DIAGNOSIS — M069 Rheumatoid arthritis, unspecified: Secondary | ICD-10-CM | POA: Diagnosis present

## 2016-04-19 DIAGNOSIS — Z8249 Family history of ischemic heart disease and other diseases of the circulatory system: Secondary | ICD-10-CM

## 2016-04-19 DIAGNOSIS — J9811 Atelectasis: Secondary | ICD-10-CM | POA: Diagnosis present

## 2016-04-19 DIAGNOSIS — L8990 Pressure ulcer of unspecified site, unspecified stage: Secondary | ICD-10-CM | POA: Insufficient documentation

## 2016-04-19 HISTORY — DX: Personal history of other infectious and parasitic diseases: Z86.19

## 2016-04-19 LAB — URINALYSIS COMPLETE WITH MICROSCOPIC (ARMC ONLY)
Bilirubin Urine: NEGATIVE
Glucose, UA: NEGATIVE mg/dL
KETONES UR: NEGATIVE mg/dL
NITRITE: NEGATIVE
PH: 8 (ref 5.0–8.0)
PROTEIN: 100 mg/dL — AB
SPECIFIC GRAVITY, URINE: 1.008 (ref 1.005–1.030)

## 2016-04-19 LAB — COMPREHENSIVE METABOLIC PANEL
ALBUMIN: 2.8 g/dL — AB (ref 3.5–5.0)
ALT: 8 U/L — ABNORMAL LOW (ref 14–54)
AST: 13 U/L — AB (ref 15–41)
Alkaline Phosphatase: 94 U/L (ref 38–126)
Anion gap: 16 — ABNORMAL HIGH (ref 5–15)
BUN: 34 mg/dL — AB (ref 6–20)
CHLORIDE: 86 mmol/L — AB (ref 101–111)
CO2: 23 mmol/L (ref 22–32)
Calcium: 8.6 mg/dL — ABNORMAL LOW (ref 8.9–10.3)
Creatinine, Ser: 0.51 mg/dL (ref 0.44–1.00)
GFR calc Af Amer: >60 mL/min (ref >60)
GFR calc non Af Amer: >60 mL/min (ref >60)
GLUCOSE: 171 mg/dL — AB (ref 65–99)
POTASSIUM: 3.1 mmol/L — AB (ref 3.5–5.1)
SODIUM: 125 mmol/L — AB (ref 135–145)
Total Bilirubin: 0.3 mg/dL (ref 0.3–1.2)
Total Protein: 6.9 g/dL (ref 6.5–8.1)

## 2016-04-19 LAB — CBC WITH DIFFERENTIAL/PLATELET
Basophils Absolute: 0.1 K/uL (ref 0–0.1)
Basophils Relative: 0 %
Eosinophils Absolute: 0 K/uL (ref 0–0.7)
Eosinophils Relative: 0 %
HCT: 35.6 % (ref 35.0–47.0)
Hemoglobin: 12 g/dL (ref 12.0–16.0)
Lymphocytes Relative: 1 %
Lymphs Abs: 0.3 K/uL — ABNORMAL LOW (ref 1.0–3.6)
MCH: 33.4 pg (ref 26.0–34.0)
MCHC: 33.7 g/dL (ref 32.0–36.0)
MCV: 99.3 fL (ref 80.0–100.0)
Monocytes Absolute: 0.8 K/uL (ref 0.2–0.9)
Monocytes Relative: 3 %
Neutro Abs: 23.5 K/uL — ABNORMAL HIGH (ref 1.4–6.5)
Neutrophils Relative %: 96 %
Platelets: 422 K/uL (ref 150–440)
RBC: 3.59 MIL/uL — ABNORMAL LOW (ref 3.80–5.20)
RDW: 16.7 % — ABNORMAL HIGH (ref 11.5–14.5)
WBC: 24.6 K/uL — ABNORMAL HIGH (ref 3.6–11.0)

## 2016-04-19 LAB — LACTIC ACID, PLASMA
Lactic Acid, Venous: 2.3 mmol/L (ref 0.5–2.0)
Lactic Acid, Venous: 1.5 mmol/L (ref 0.5–2.0)

## 2016-04-19 LAB — PROTIME-INR
INR: 1.12
Prothrombin Time: 14.6 s (ref 11.4–15.0)

## 2016-04-19 LAB — TYPE AND SCREEN
ABO/RH(D): O POS
Antibody Screen: NEGATIVE

## 2016-04-19 LAB — BRAIN NATRIURETIC PEPTIDE: B NATRIURETIC PEPTIDE 5: 63 pg/mL (ref 0.0–100.0)

## 2016-04-19 LAB — TROPONIN I: TROPONIN I: 0.03 ng/mL (ref <0.031)

## 2016-04-19 LAB — APTT: aPTT: 37 s — ABNORMAL HIGH (ref 24–36)

## 2016-04-19 MED ORDER — VANCOMYCIN HCL IN DEXTROSE 1-5 GM/200ML-% IV SOLN
1000.0000 mg | Freq: Once | INTRAVENOUS | Status: AC
  Administered 2016-04-19: 1000 mg via INTRAVENOUS
  Filled 2016-04-19: qty 200

## 2016-04-19 MED ORDER — ENSURE ENLIVE PO LIQD
237.0000 mL | Freq: Two times a day (BID) | ORAL | Status: DC
  Administered 2016-04-20 (×2): 237 mL via ORAL

## 2016-04-19 MED ORDER — VITAMIN C 500 MG PO TABS
1000.0000 mg | ORAL_TABLET | Freq: Every day | ORAL | Status: DC
  Administered 2016-04-20 – 2016-04-22 (×3): 1000 mg via ORAL
  Filled 2016-04-19 (×3): qty 2

## 2016-04-19 MED ORDER — PIPERACILLIN-TAZOBACTAM 3.375 G IVPB
3.3750 g | Freq: Once | INTRAVENOUS | Status: AC
  Administered 2016-04-19: 3.375 g via INTRAVENOUS
  Filled 2016-04-19: qty 50

## 2016-04-19 MED ORDER — METOPROLOL SUCCINATE ER 50 MG PO TB24
50.0000 mg | ORAL_TABLET | ORAL | Status: DC
  Administered 2016-04-20 – 2016-04-22 (×3): 50 mg via ORAL
  Filled 2016-04-19 (×4): qty 1

## 2016-04-19 MED ORDER — PRIMIDONE 50 MG PO TABS
50.0000 mg | ORAL_TABLET | Freq: Two times a day (BID) | ORAL | Status: DC
  Administered 2016-04-19 – 2016-04-22 (×6): 50 mg via ORAL
  Filled 2016-04-19 (×6): qty 1

## 2016-04-19 MED ORDER — MONTELUKAST SODIUM 10 MG PO TABS
10.0000 mg | ORAL_TABLET | Freq: Every evening | ORAL | Status: DC
Start: 2016-04-19 — End: 2016-04-22
  Administered 2016-04-19 – 2016-04-21 (×3): 10 mg via ORAL
  Filled 2016-04-19 (×3): qty 1

## 2016-04-19 MED ORDER — ALPRAZOLAM 0.25 MG PO TABS: 0.2500 mg | ORAL_TABLET | Freq: Two times a day (BID) | ORAL | Status: DC | PRN

## 2016-04-19 MED ORDER — MIRTAZAPINE 15 MG PO TABS
30.0000 mg | ORAL_TABLET | Freq: Every day | ORAL | Status: DC
  Administered 2016-04-19 – 2016-04-21 (×3): 30 mg via ORAL
  Filled 2016-04-19 (×3): qty 2

## 2016-04-19 MED ORDER — POLYETHYLENE GLYCOL 3350 17 G PO PACK: 17.0000 g | PACK | Freq: Every day | ORAL | Status: DC | PRN

## 2016-04-19 MED ORDER — PANTOPRAZOLE SODIUM 40 MG PO TBEC
40.0000 mg | DELAYED_RELEASE_TABLET | Freq: Every day | ORAL | Status: DC
  Administered 2016-04-20 – 2016-04-22 (×3): 40 mg via ORAL
  Filled 2016-04-19 (×3): qty 1

## 2016-04-19 MED ORDER — SODIUM CHLORIDE 0.9 % IV BOLUS (SEPSIS)
500.0000 mL | Freq: Once | INTRAVENOUS | Status: AC
  Administered 2016-04-19: 500 mL via INTRAVENOUS

## 2016-04-19 MED ORDER — PIPERACILLIN-TAZOBACTAM 3.375 G IVPB
3.3750 g | Freq: Three times a day (TID) | INTRAVENOUS | Status: DC
  Administered 2016-04-19 – 2016-04-21 (×5): 3.375 g via INTRAVENOUS
  Filled 2016-04-19 (×7): qty 50

## 2016-04-19 MED ORDER — TRAZODONE HCL 50 MG PO TABS
50.0000 mg | ORAL_TABLET | Freq: Every day | ORAL | Status: DC
  Administered 2016-04-19 – 2016-04-21 (×3): 50 mg via ORAL
  Filled 2016-04-19 (×3): qty 1

## 2016-04-19 MED ORDER — FLUTICASONE PROPIONATE 50 MCG/ACT NA SUSP
2.0000 | Freq: Two times a day (BID) | NASAL | Status: DC
  Administered 2016-04-19 – 2016-04-22 (×5): 2 via NASAL
  Filled 2016-04-19: qty 16

## 2016-04-19 MED ORDER — HYDROCODONE-ACETAMINOPHEN 5-325 MG PO TABS
2.0000 | ORAL_TABLET | Freq: Two times a day (BID) | ORAL | Status: DC
  Administered 2016-04-20 – 2016-04-22 (×5): 2 via ORAL
  Filled 2016-04-19 (×6): qty 2

## 2016-04-19 MED ORDER — POTASSIUM CHLORIDE CRYS ER 20 MEQ PO TBCR
20.0000 meq | EXTENDED_RELEASE_TABLET | Freq: Two times a day (BID) | ORAL | Status: DC
  Administered 2016-04-19 – 2016-04-22 (×6): 20 meq via ORAL
  Filled 2016-04-19 (×6): qty 1

## 2016-04-19 MED ORDER — FOLIC ACID 1 MG PO TABS
1.0000 mg | ORAL_TABLET | Freq: Every day | ORAL | Status: DC
  Administered 2016-04-20 – 2016-04-22 (×3): 1 mg via ORAL
  Filled 2016-04-19 (×3): qty 1

## 2016-04-19 MED ORDER — HYDROXYCHLOROQUINE SULFATE 200 MG PO TABS
200.0000 mg | ORAL_TABLET | Freq: Two times a day (BID) | ORAL | Status: DC
  Administered 2016-04-19 – 2016-04-22 (×6): 200 mg via ORAL
  Filled 2016-04-19 (×6): qty 1

## 2016-04-19 MED ORDER — DULOXETINE HCL 60 MG PO CPEP
60.0000 mg | ORAL_CAPSULE | Freq: Every day | ORAL | Status: DC
  Administered 2016-04-20 – 2016-04-22 (×3): 60 mg via ORAL
  Filled 2016-04-19 (×3): qty 1

## 2016-04-19 MED ORDER — HEPARIN SODIUM (PORCINE) 5000 UNIT/ML IJ SOLN
5000.0000 [IU] | Freq: Three times a day (TID) | INTRAMUSCULAR | Status: DC
  Administered 2016-04-19 – 2016-04-22 (×7): 5000 [IU] via SUBCUTANEOUS
  Filled 2016-04-19 (×7): qty 1

## 2016-04-19 MED ORDER — SODIUM CHLORIDE 0.9 % IV BOLUS (SEPSIS)
1000.0000 mL | Freq: Once | INTRAVENOUS | Status: AC
  Administered 2016-04-19: 1000 mL via INTRAVENOUS

## 2016-04-19 MED ORDER — ONDANSETRON HCL 4 MG PO TABS
4.0000 mg | ORAL_TABLET | ORAL | Status: DC | PRN
  Administered 2016-04-20: 4 mg via ORAL
  Filled 2016-04-19: qty 1

## 2016-04-19 NOTE — ED Provider Notes (Signed)
[facility] Emergency Department Provider Note   ____________________________________________  Time seen: Approximately 2:42 PM  I have reviewed the triage vital signs and the nursing notes.   HISTORY  Chief Complaint Urinary Tract Infection    HPI Stacey Torres is a 82 y.o. female with history of cerebral palsy, hypertension, neurogenic bladder who presents for evaluation of greenish urethral discharge noted at her living facility, gradual onset today, constant, and no modifying factors. She was recently treated for UTI 2 weeks ago. On EMS arrival, she is found to be tachycardic but was alert and oriented. The patient denies any pain complaints. She remains any chest pain, difficulty breathing, abdominal pain, nausea vomiting or diarrhea.   Past Medical History  Diagnosis Date  . Cerebral palsy, hemiplegic ([place])     pt is intelligant  . Neurologic gait dysfunction   . Spinal stenosis   . Urge urinary incontinence   . Hypertension   . Arthritis   . Neurogenic bladder   . Hydronephrosis, right   . Chronic cystitis   . Gross hematuria   . RA (rheumatoid arthritis) ([facility])     advanced  . Flexion contracture joint of multiple sites     secondary to advanced ra  . Slurred speech   . Cancer ([place]) skin ca  . Seasonal allergies   . UTI (lower urinary tract infection)   . Sepsis due to urinary tract infection ([place])   . Cough   . RA (rheumatoid arthritis) ([place])   . Edema, lower extremity   . Cataract     Patient Active Problem List   Diagnosis Date Noted  . UTI (lower urinary tract infection) 03/30/2016  . Protein-calorie malnutrition, severe ([place]) 07/07/2015  . Sepsis ([place]) 07/06/2015  . Upper GI bleed 07/06/2015  . Hyperkalemia 07/06/2015  . Endometrial thickening on ultra sound   . Malnutrition of moderate degree ([place]) 04/02/2015  . Rheumatoid arthritis ([place]) 04/01/2015  . Essential hypertension 04/01/2015  . Hydronephrosis of right  kidney 11/21/2013    Past Surgical History  Procedure Laterality Date  . Total knee arthroplasty Right 2005  . Cholecystectomy  1995  . Cystoscopy with retrograde pyelogram, ureteroscopy and stent placement Bilateral 11/21/2013    Procedure: CYSTOSCOPY WITH RIGHT RETROGRADE PYELOGRAM, RIGHT FLEXIBLE and rigid URETEROSCOPY  PLACEMENT;  Surgeon: Bernestine Amass, MD;  Location: [facility];  Service: Urology;  Laterality: Bilateral;  . Cystogram  11/21/2013    Procedure: CYSTOGRAM;  Surgeon: Bernestine Amass, MD;  Location: [facility];  Service: Urology;;  . Dg toes rt foot multiple specif ([facility] hx)    . Hysteroscopy w/d&c N/A 05/09/2015    Procedure: DILATATION AND CURETTAGE [place], Myosure;  Surgeon: Gillis Ends, MD;  Location: [facility];  Service: Gynecology;  Laterality: N/A;  . Joint replacement    . Cataract extraction w/phaco Right 09/24/2015    Procedure: CATARACT EXTRACTION PHACO AND INTRAOCULAR LENS PLACEMENT (IOC);  Surgeon: Estill Cotta, MD;  Location: [facility] OR[facility];  Service: Ophthalmology;  Laterality: Right;  [place] 01:53 AP% 26.0 CDE 50.20 fluid pack lot [id number]    Current Outpatient Rx  Name  Route  Sig  Dispense  Refill  . acetaminophen (TYLENOL) 650 MG CR tablet   Oral   Take 650 mg by mouth 3 (three) times daily.          Marland Kitchen ALPRAZolam (XANAX) 0.25 MG tablet   Oral   Take 1 tablet (0.25 mg total) by mouth 2 (  two) times daily as needed.   10 tablet   0   . Ascorbic Acid (VITAMIN C) 1000 MG tablet   Oral   Take 1,000 mg by mouth daily.         . Calcium Carbonate-Vitamin D (CALCIUM 600+D) 600-400 MG-UNIT per tablet   Oral   Take 1 tablet by mouth every evening.         . cefUROXime (CEFTIN) 250 MG tablet   Oral   Take 1 tablet (250 mg total) by mouth 2 (two) times daily with a meal.   6 tablet   0   . DULoxetine (CYMBALTA) 60 MG capsule   Oral   Take 60 mg by mouth daily.          . feeding supplement,  ENSURE ENLIVE, (ENSURE ENLIVE) LIQD   Oral   Take 237 mLs by mouth 2 (two) times daily between meals. Patient not taking: Reported on 07/06/2015   237 mL   12   . fluticasone (FLONASE) 50 MCG/ACT nasal spray   Each Nare   Place 2 sprays into both nostrils 2 (two) times daily.         . folic acid (FOLVITE) 1 MG tablet   Oral   Take 1 mg by mouth daily.         Marland Kitchen HYDROcodone-acetaminophen (NORCO/VICODIN) 5-325 MG per tablet   Oral   Take 2 tablets by mouth daily.          . hydroxychloroquine (PLAQUENIL) 200 MG tablet   Oral   Take 200 mg by mouth 2 (two) times daily.         . Hypromellose (ARTIFICIAL TEARS OP)   Ophthalmic   Apply 2 drops to eye 2 (two) times daily as needed (for dry eyes).         Marland Kitchen loratadine (CLARITIN) 10 MG tablet   Oral   Take 10 mg by mouth daily.          . methotrexate (RHEUMATREX) 2.5 MG tablet   Oral   Take 15 mg by mouth once a week. On Friday         . metoprolol succinate (TOPROL-XL) 50 MG 24 hr tablet   Oral   Take 50 mg by mouth 2 (two) times daily.          . montelukast (SINGULAIR) 10 MG tablet   Oral   Take 10 mg by mouth every evening.         . Multiple Vitamin (THEREMS) TABS   Oral   Take 1 tablet by mouth daily.         Marland Kitchen omeprazole (PRILOSEC) 20 MG capsule   Oral   Take 1 capsule (20 mg total) by mouth daily.   60 capsule   0   . ondansetron (ZOFRAN) 4 MG tablet   Oral   Take 4 mg by mouth every 4 (four) hours as needed for nausea or vomiting.          . polyethylene glycol (MIRALAX / GLYCOLAX) packet   Oral   Take 17 g by mouth daily as needed.         . primidone (MYSOLINE) 50 MG tablet   Oral   Take 50 mg by mouth 2 (two) times daily.          . sennosides-docusate sodium (SENOKOT-S) 8.6-50 MG tablet   Oral   Take 1 tablet by mouth at bedtime.         Marland Kitchen  traZODone (DESYREL) 50 MG tablet   Oral   Take 50 mg by mouth at bedtime.           Allergies Aspirin and  Macrobid  Family History  Problem Relation Age of Onset  . CAD Mother   . Deep vein thrombosis Father   . Breast cancer Sister     Social History Social History  Substance Use Topics  . Smoking status: Former Smoker    Quit date: 11/18/1958  . Smokeless tobacco: Never Used  . Alcohol Use: No    Review of Systems Constitutional: No fever/chills Eyes: No visual changes. ENT: No sore throat. Cardiovascular: Denies chest pain. Respiratory: Denies shortness of breath. Gastrointestinal: No abdominal pain.  No nausea, no vomiting.  No diarrhea.  No constipation. Genitourinary: Negative for dysuria. Musculoskeletal: Negative for back pain. Skin: Negative for rash. Neurological: Negative for headaches, focal weakness or numbness.  10-point ROS otherwise negative.  ____________________________________________   PHYSICAL EXAM:  Filed Vitals:   04/19/16 1600 04/19/16 1610 04/19/16 1620 04/19/16 1630  BP: 110/66 120/66 109/60 111/61  Pulse:  114 110 109  Temp:      TempSrc:      Resp: 26 19 24 18   Height:      Weight:      SpO2:  97% 81% 94%      Constitutional: Alert and oriented. Nontoxic appearing and in no acute distress. Eyes: Conjunctivae are normal. PERRL. EOMI. Head: Atraumatic. Nose: No congestion/rhinnorhea. Mouth/Throat: Mucous membranes are moist.  Oropharynx non-erythematous. Neck: No stridor.  Supple without meningismus. Cardiovascular: Tachycardic rate, regular rhythm. Grossly normal heart sounds.  Good peripheral circulation. Respiratory: Normal respiratory effort.  No retractions. Lungs CTAB. Mild tachypnea. Gastrointestinal: Soft and nontender. No distention. No CVA tenderness. Genitourinary: Normal external genitalia, there does appear to be some whitish discharge from the urethra but it does not appear to be exiting the vagina. Musculoskeletal: No lower extremity tenderness nor edema.  Chronic contractures of the hands bilaterally as well as the  legs Neurologic:  Normal speech and language. Muscle wasting and contractures of the hands as well as bilateral lower extremities. Skin:  Skin is warm, dry and intact. No rash noted. Psychiatric: Mood and affect are normal. Speech and behavior are normal.  ____________________________________________   LABS (all labs ordered are listed, but only abnormal results are displayed)  Labs Reviewed  LACTIC ACID, PLASMA - Abnormal; Notable for the following:    Lactic Acid, Venous 2.3 (*)    All other components within normal limits  CBC WITH DIFFERENTIAL/PLATELET - Abnormal; Notable for the following:    WBC 24.6 (*)    RBC 3.59 (*)    RDW 16.7 (*)    Neutro Abs 23.5 (*)    Lymphs Abs 0.3 (*)    All other components within normal limits  URINALYSIS COMPLETEWITH MICROSCOPIC ([facility] ONLY) - Abnormal; Notable for the following:    Color, Urine YELLOW (*)    APPearance CLOUDY (*)    Hgb urine dipstick 1+ (*)    Protein, ur 100 (*)    Leukocytes, UA 3+ (*)    Bacteria, UA FEW (*)    Squamous Epithelial / LPF 0-5 (*)    All other components within normal limits  APTT - Abnormal; Notable for the following:    aPTT 37 (*)    All other components within normal limits  COMPREHENSIVE METABOLIC PANEL - Abnormal; Notable for the following:    Sodium 125 (*)    Potassium  3.1 (*)    Chloride 86 (*)    Glucose, Bld 171 (*)    BUN 34 (*)    Calcium 8.6 (*)    Albumin 2.8 (*)    AST 13 (*)    ALT 8 (*)    Anion gap 16 (*)    All other components within normal limits  CULTURE, BLOOD (ROUTINE X 2)  CULTURE, BLOOD (ROUTINE X 2)  URINE CULTURE  PROTIME-INR  TROPONIN I  BRAIN NATRIURETIC PEPTIDE  LACTIC ACID, PLASMA  TYPE AND SCREEN   ____________________________________________  EKG  ED ECG REPORT I, Joanne Gavel, the attending physician, personally viewed and interpreted this ECG.   Date: 04/19/2016  EKG Time: 14:40  Rate: 126  Rhythm: sinus tachycardia with PVCs  Axis: normal   Intervals:none  ST&T Change: No acute ST elevation.  ____________________________________________  RADIOLOGY  CXR IMPRESSION: No active disease. No significant change.  ____________________________________________   PROCEDURES  Procedure(s) performed: None  Critical Care performed: Yes, see critical care note(s). Total critical care time spent 35 minutes.  ____________________________________________   INITIAL IMPRESSION / ASSESSMENT AND PLAN / ED COURSE  Pertinent labs & imaging results that were available during my care of the patient were reviewed by me and considered in my medical decision making (see chart for details).  Stacey Torres is a 82 y.o. female with history of cerebral palsy, hypertension, neurogenic bladder who presents for evaluation of greenish urethral discharge noted at her living facility. On exam she is nontoxic appearing but she is mildly tachypneic and tachycardic meeting 2 out of 4 Sirs criteria. Good sepsis initiated. We'll give IV vancomycin and Zosyn, IV fluids, obtain screening labs, urinalysis and chest x-ray. Anticipate admission.  ----------------------------------------- 4:07 PM on 04/19/2016 ----------------------------------------- White blood cell count elevated at almost 25,000. CMP is notable for hyponatremia, sodium of 125. Venous lactic acid is elevated at 2.3. Urinalysis is consistent with urinary tract infection. Patient with improvement of her heart rate at this time, tachypnea improving, blood pressure stable, still well-appearing, will continue the above treatment. Case discussed with the hospitalist, Dr. Doy Hutching, for admission at this time.  ____________________________________________   FINAL CLINICAL IMPRESSION(S) / ED DIAGNOSES  Final diagnoses:  Sepsis secondary to UTI [facility])      NEW MEDICATIONS STARTED DURING THIS VISIT:  New Prescriptions   No medications on file     Note:  This document was prepared using  Dragon voice recognition software and may include unintentional dictation errors.    Joanne Gavel, MD 04/19/16 [phone]

## 2016-04-19 NOTE — H&P (Signed)
[facility] NAME: Stacey Torres    MR#:  [id number]  DATE OF BIRTH:  [birth year]  DATE OF ADMISSION:  04/19/2016  PRIMARY CARE PHYSICIAN: Viviana Simpler, MD   REQUESTING/REFERRING PHYSICIAN:   CHIEF COMPLAINT:   Chief Complaint  Patient presents with  . Urinary Tract Infection    HISTORY OF PRESENT ILLNESS: Stacey Torres  is a 82 y.o. female was admitted 2 weeks ago- with UTI, given 2 days of IV zosyn, and d/c on Cefuroxime- lives in NH and started feeling weak again. Found to have sepsis in ER with UTI. On following Cx from last UTI- it shows ESBL - resistant to ceftriaxone.  PAST MEDICAL HISTORY:   Past Medical History  Diagnosis Date  . Cerebral palsy, hemiplegic ([place])     pt is intelligant  . Neurologic gait dysfunction   . Spinal stenosis   . Urge urinary incontinence   . Hypertension   . Arthritis   . Neurogenic bladder   . Hydronephrosis, right   . Chronic cystitis   . Gross hematuria   . RA (rheumatoid arthritis) (HCC)     advanced  . Flexion contracture joint of multiple sites     secondary to advanced ra  . Slurred speech   . Cancer ([place]) skin ca  . Seasonal allergies   . UTI (lower urinary tract infection)   . Sepsis due to urinary tract infection ([place])   . Cough   . RA (rheumatoid arthritis) ([place])   . Edema, lower extremity   . Cataract   . History of ESBL E. coli infection     PAST SURGICAL HISTORY: Past Surgical History  Procedure Laterality Date  . Total knee arthroplasty Right 2005  . Cholecystectomy  1995  . Cystoscopy with retrograde pyelogram, ureteroscopy and stent placement Bilateral 11/21/2013    Procedure: CYSTOSCOPY WITH RIGHT RETROGRADE PYELOGRAM, RIGHT FLEXIBLE and rigid URETEROSCOPY  PLACEMENT;  Surgeon: Bernestine Amass, MD;  [facility];  Service: Urology;  Laterality: Bilateral;  . Cystogram  11/21/2013    Procedure: CYSTOGRAM;  Surgeon: Bernestine Amass, MD;  Location: [facility];  Service: Urology;;  . Dg toes rt foot multiple specif ([facility] hx)    . Hysteroscopy w/d&c N/A 05/09/2015    Procedure: DILATATION AND CURETTAGE Pollyann Glen, Myosure;  Surgeon: Gillis Ends, MD;  Location: [facility];  Service: Gynecology;  Laterality: N/A;  . Joint replacement    . Cataract extraction w/phaco Right 09/24/2015    Procedure: CATARACT EXTRACTION PHACO AND INTRAOCULAR LENS PLACEMENT (IOC);  Surgeon: Estill Cotta, MD;  Location: [facility];  Service: Ophthalmology;  Laterality: Right;  [place] 01:53 AP% 26.0 CDE 50.20 fluid pack lot #[id number]    SOCIAL HISTORY:  Social History  Substance Use Topics  . Smoking status: Former Smoker    Quit date: 11/18/1958  . Smokeless tobacco: Never Used  . Alcohol Use: No    FAMILY HISTORY:  Family History  Problem Relation Age of Onset  . CAD Mother   . Deep vein thrombosis Father   . Breast cancer Sister     DRUG ALLERGIES:  Allergies  Allergen Reactions  . Aspirin Other (See Comments)    Gi bleeding  . Macrobid [Nitrofurantoin] Other (See Comments)    Reaction:  Unknown     REVIEW OF SYSTEMS:   CONSTITUTIONAL: No fever, positive for fatigue or weakness.  EYES: No blurred or double vision.  EARS,  NOSE, AND THROAT: No tinnitus or ear pain.  RESPIRATORY: No cough, shortness of breath, wheezing or hemoptysis.  CARDIOVASCULAR: No chest pain, orthopnea, edema.  GASTROINTESTINAL: No nausea, vomiting, diarrhea or abdominal pain.  GENITOURINARY: No dysuria, hematuria.  ENDOCRINE: No polyuria, nocturia,  HEMATOLOGY: No anemia, easy bruising or bleeding SKIN: No rash or lesion. MUSCULOSKELETAL: No joint pain or arthritis.   NEUROLOGIC: No tingling, numbness, weakness.  PSYCHIATRY: No anxiety or depression.   MEDICATIONS AT HOME:  Prior to Admission medications   Medication Sig Start Date End Date Taking? Authorizing Provider  acetaminophen (TYLENOL) 650 MG CR tablet  Take 650 mg by mouth 3 (three) times daily.    Yes Historical Provider, MD  ALPRAZolam (XANAX) 0.25 MG tablet Take 1 tablet (0.25 mg total) by mouth 2 (two) times daily as needed. Patient taking differently: Take 0.25 mg by mouth See admin instructions. Take 0.25 mg by mouth 2 times a day at 4 pm and 8 pm-scheduled, and Take 1 tablet by mouth 2 times a day as needed for increased tremors-prn 04/05/15  Yes Nicholes Mango, MD  Ascorbic Acid (VITAMIN C) 1000 MG tablet Take 1,000 mg by mouth daily.   Yes Historical Provider, MD  DULoxetine (CYMBALTA) 60 MG capsule Take 60 mg by mouth daily.    Yes Historical Provider, MD  feeding supplement, ENSURE ENLIVE, (ENSURE ENLIVE) LIQD Take 237 mLs by mouth 2 (two) times daily between meals. 04/05/15  Yes Nicholes Mango, MD  fluticasone (FLONASE) 50 MCG/ACT nasal spray Place 2 sprays into both nostrils 2 (two) times daily.   Yes Historical Provider, MD  folic acid (FOLVITE) 1 MG tablet Take 1 mg by mouth daily.   Yes Historical Provider, MD  HYDROcodone-acetaminophen (NORCO/VICODIN) 5-325 MG per tablet Take 2 tablets by mouth 2 (two) times daily. Take at 4 pm and 8 pm.   Yes Historical Provider, MD  hydroxychloroquine (PLAQUENIL) 200 MG tablet Take 200 mg by mouth 2 (two) times daily.   Yes Historical Provider, MD  Hypromellose (ARTIFICIAL TEARS OP) Apply 2 drops to eye 2 (two) times daily as needed (for dry eyes).   Yes Historical Provider, MD  loratadine (CLARITIN) 10 MG tablet Take 10 mg by mouth daily.    Yes Historical Provider, MD  metoprolol succinate (TOPROL-XL) 50 MG 24 hr tablet Take 50 mg by mouth every morning. *Hold if B/P is < than 110/60.)   Yes Historical Provider, MD  mirtazapine (REMERON) 30 MG tablet Take 30 mg by mouth at bedtime.   Yes Historical Provider, MD  montelukast (SINGULAIR) 10 MG tablet Take 10 mg by mouth every evening.   Yes Historical Provider, MD  Multiple Vitamin (THEREMS) TABS Take 1 tablet by mouth daily.   Yes Historical Provider,  MD  omeprazole (PRILOSEC) 20 MG capsule Take 1 capsule (20 mg total) by mouth daily. 07/08/15  Yes Gladstone Lighter, MD  ondansetron (ZOFRAN) 4 MG tablet Take 4 mg by mouth every 4 (four) hours as needed for nausea or vomiting.    Yes Historical Provider, MD  polyethylene glycol (MIRALAX / GLYCOLAX) packet Take 17 g by mouth daily as needed for mild constipation or moderate constipation.    Yes Historical Provider, MD  primidone (MYSOLINE) 50 MG tablet Take 50 mg by mouth 2 (two) times daily.    Yes Historical Provider, MD  sennosides-docusate sodium (SENOKOT-S) 8.6-50 MG tablet Take 1 tablet by mouth at bedtime.   Yes Historical Provider, MD  traZODone (DESYREL) 50 MG tablet Take 50  mg by mouth at bedtime.   Yes Historical Provider, MD  cefUROXime (CEFTIN) 250 MG tablet Take 1 tablet (250 mg total) by mouth 2 (two) times daily with a meal. 04/01/16   Srikar Sudini, MD      PHYSICAL EXAMINATION:   VITAL SIGNS: Blood pressure 111/61, pulse 109, temperature 98.8 F (37.1 C), temperature source Rectal, resp. rate 18, height 5\' 5"  (1.651 m), weight 49.896 kg (110 lb), SpO2 94 %.  GENERAL:  82 y.o.-year-old patient lying in the bed with no acute distress.  EYES: Pupils equal, round, reactive to light and accommodation. No scleral icterus. Extraocular muscles intact.  HEENT: Head atraumatic, normocephalic. Oropharynx and nasopharynx clear.  NECK:  Supple, no jugular venous distention. No thyroid enlargement, no tenderness.  LUNGS: Normal breath sounds bilaterally, no wheezing, rales,rhonchi or crepitation. No use of accessory muscles of respiration.  CARDIOVASCULAR: S1, S2 normal. No murmurs, rubs, or gallops.  ABDOMEN: Soft, nontender, nondistended. Bowel sounds present. No organomegaly or mass.  EXTREMITIES: No pedal edema, cyanosis, or clubbing.  NEUROLOGIC: Cranial nerves II through XII are intact. Muscle strength 4/5 in upper extremities, 1/5 in lower extremities. Sensation intact. Gait not  checked.  PSYCHIATRIC: The patient is alert and oriented x 3.  SKIN: No obvious rash, lesion, or ulcer.   LABORATORY PANEL:   CBC   Recent Labs Lab 04/19/16 1504  WBC 24.6*  HGB 12.0  HCT 35.6  PLT 422  MCV 99.3  MCH 33.4  MCHC 33.7  RDW 16.7*  LYMPHSABS 0.3*  MONOABS 0.8  EOSABS 0.0  BASOSABS 0.1   ------------------------------------------------------------------------------------------------------------------  Chemistries   Recent Labs Lab 04/19/16 1546  NA 125*  K 3.1*  CL 86*  CO2 23  GLUCOSE 171*  BUN 34*  CREATININE 0.51  CALCIUM 8.6*  AST 13*  ALT 8*  ALKPHOS 94  BILITOT 0.3   ------------------------------------------------------------------------------------------------------------------ estimated creatinine clearance is 41.2 mL/min (by C-G formula based on Cr of 0.51). ------------------------------------------------------------------------------------------------------------------ No results for input(s): TSH, T4TOTAL, T3FREE, THYROIDAB in the last 72 hours.  Invalid input(s): FREET3   Coagulation profile  Recent Labs Lab 04/19/16 1546  INR 1.12   ------------------------------------------------------------------------------------------------------------------- No results for input(s): DDIMER in the last 72 hours. -------------------------------------------------------------------------------------------------------------------  Cardiac Enzymes  Recent Labs Lab 04/19/16 1546  TROPONINI 0.03   ------------------------------------------------------------------------------------------------------------------ Invalid input(s): POCBNP  ---------------------------------------------------------------------------------------------------------------  Urinalysis    Component Value Date/Time   COLORURINE YELLOW* 04/19/2016 1504   APPEARANCEUR CLOUDY* 04/19/2016 1504   LABSPEC 1.008 04/19/2016 1504   PHURINE 8.0 04/19/2016 1504    GLUCOSEU NEGATIVE 04/19/2016 1504   HGBUR 1+* 04/19/2016 1504   BILIRUBINUR NEGATIVE 04/19/2016 1504   KETONESUR NEGATIVE 04/19/2016 1504   PROTEINUR 100* 04/19/2016 1504   NITRITE NEGATIVE 04/19/2016 1504   LEUKOCYTESUR 3+* 04/19/2016 1504     RADIOLOGY: Dg Chest Port 1 View  04/19/2016  CLINICAL DATA:  Sepsis EXAM: PORTABLE CHEST 1 VIEW COMPARISON:  07/06/2015 FINDINGS: Cardiomediastinal silhouette is stable. No infiltrate or pulmonary edema. Again noted chronic eventration of the left posterior hemidiaphragm. Stable degenerative changes bilateral shoulders. Mild left basilar atelectasis. IMPRESSION: No active disease.  No significant change. Electronically Signed   By: Lahoma Crocker M.D.   On: 04/19/2016 15:39    EKG: Orders placed or performed during the hospital encounter of 04/19/16  . ED EKG 12-Lead  . ED EKG 12-Lead  . EKG 12-Lead  . EKG 12-Lead    IMPRESSION AND PLAN:  * Sepsis due to UTI with ESBL   Found  ESBL in last Cx report.   Will cont IV zosyn, may need PICC line and 2 weeks of IV Abx.   IV fluids.  * Hyponatremia due to dehydration. We'll repeat labs once fluid resuscitated.  * Hypokalemia due to decreased oral intake. Replace orally and repeat labs.  * Weakness due to UTI and dehydration. Patient is bedbound at baseline and gets into wheelchair with assistance.  * HYpertension   Metoprolol  All the records are reviewed and case discussed with ED provider. Management plans discussed with the patient, family and they are in agreement.  CODE STATUS: DNR Code Status History    Date Active Date Inactive Code Status Order ID Comments User Context   03/30/2016 11:59 AM 04/01/2016  8:59 PM DNR [id number]  Hillary Bow, MD ED   07/06/2015 10:04 PM 07/08/2015  7:05 PM DNR [id number]  Lytle Butte, MD ED   04/01/2015 10:08 PM 04/05/2015  5:54 PM DNR [id number]  Lytle Butte, MD ED    Questions for Most Recent Historical Code Status (Order [id number])    Question Answer  Comment   In the event of cardiac or respiratory ARREST Do not call a "code blue"    In the event of cardiac or respiratory ARREST Do not perform Intubation, CPR, defibrillation or ACLS    In the event of cardiac or respiratory ARREST Use medication by any route, position, wound care, and other measures to relive pain and suffering. May use oxygen, suction and manual treatment of airway obstruction as needed for comfort.     Advance Directive Documentation        Most Recent Value   Type of Advance Directive  Out of facility DNR (pink MOST or yellow form)   Pre-existing out of facility DNR order (yellow form or pink MOST form)  Physician notified to receive inpatient order, Yellow form placed in chart (order not valid for inpatient use)   "MOST" Form in Place?         TOTAL TIME TAKING CARE OF THIS PATIENT: 50 minutes.    Vaughan Basta M.D on 04/19/2016   Between 7am to 6pm - Pager - [phone]  After 6pm go to [email or web address] - password [email or web address] [facility]  Office  [phone]  CC: Primary care physician; Viviana Simpler, MD   Note: This dictation was prepared with Dragon dictation along with smaller phrase technology. Any transcriptional errors that result from this process are unintentional.

## 2016-04-19 NOTE — Progress Notes (Signed)
ANTIBIOTIC CONSULT NOTE - INITIAL  Pharmacy Consult for Zosyn  Indication: sepsis  Allergies  Allergen Reactions  . Aspirin Other (See Comments)    Gi bleeding  . Macrobid [Nitrofurantoin] Other (See Comments)    Reaction:  Unknown     Patient Measurements: Height: 5\' 5"  (165.1 cm) Weight: 110 lb (49.896 kg) IBW/kg (Calculated) : 57 Adjusted Body Weight:   Vital Signs: Temp: 98.8 F (37.1 C) (06/10 1501) Temp Source: Rectal (06/10 1501) BP: 111/61 mmHg (06/10 1630) Pulse Rate: 109 (06/10 1630) Intake/Output from previous day:   Intake/Output from this shift:    Labs:  Recent Labs  04/19/16 1504 04/19/16 1546  WBC 24.6*  --   HGB 12.0  --   PLT 422  --   CREATININE  --  0.51   Estimated Creatinine Clearance: 41.2 mL/min (by C-G formula based on Cr of 0.51). No results for input(s): VANCOTROUGH, VANCOPEAK, VANCORANDOM, GENTTROUGH, GENTPEAK, GENTRANDOM, TOBRATROUGH, TOBRAPEAK, TOBRARND, AMIKACINPEAK, AMIKACINTROU, AMIKACIN in the last 72 hours.   Microbiology: Recent Results (from the past 720 hour(s))  Urine culture     Status: Abnormal   Collection Time: 03/30/16 10:23 AM  Result Value Ref Range Status   Specimen Description URINE, RANDOM  Final   Special Requests NONE  Final   Culture (A)  Final    >=100,000 COLONIES/mL ESCHERICHIA COLI Confirmed Extended Spectrum Beta-Lactamase Producer (ESBL) Performed at [facility]    Report Status 04/02/2016 FINAL  Final   Organism ID, Bacteria ESCHERICHIA COLI (A)  Final      Susceptibility   Escherichia coli - MIC*    AMPICILLIN >=32 RESISTANT Resistant     CEFAZOLIN >=64 RESISTANT Resistant     CEFTRIAXONE >=64 RESISTANT Resistant     CIPROFLOXACIN >=4 RESISTANT Resistant     GENTAMICIN <=1 SENSITIVE Sensitive     IMIPENEM <=0.25 SENSITIVE Sensitive     NITROFURANTOIN <=16 SENSITIVE Sensitive     TRIMETH/SULFA >=320 RESISTANT Resistant     AMPICILLIN/SULBACTAM >=32 RESISTANT Resistant     PIP/TAZO  16 SENSITIVE Sensitive     Extended ESBL POSITIVE Resistant     * >=100,000 COLONIES/mL ESCHERICHIA COLI  Urine culture     Status: Abnormal   Collection Time: 03/30/16 10:23 AM  Result Value Ref Range Status   Specimen Description URINE, RANDOM  Final   Special Requests Normal  Final   Culture (A)  Final    >=100,000 COLONIES/mL ESCHERICHIA COLI Confirmed Extended Spectrum Beta-Lactamase Producer (ESBL) Performed at [facility]    Report Status 04/02/2016 FINAL  Final   Organism ID, Bacteria ESCHERICHIA COLI (A)  Final      Susceptibility   Escherichia coli - MIC*    AMPICILLIN >=32 RESISTANT Resistant     CEFAZOLIN >=64 RESISTANT Resistant     CEFTRIAXONE >=64 RESISTANT Resistant     CIPROFLOXACIN >=4 RESISTANT Resistant     GENTAMICIN <=1 SENSITIVE Sensitive     IMIPENEM <=0.25 SENSITIVE Sensitive     NITROFURANTOIN <=16 SENSITIVE Sensitive     TRIMETH/SULFA >=320 RESISTANT Resistant     AMPICILLIN/SULBACTAM >=32 RESISTANT Resistant     PIP/TAZO >=128 RESISTANT Resistant     Extended ESBL POSITIVE Resistant     * >=100,000 COLONIES/mL ESCHERICHIA COLI  MRSA PCR Screening     Status: None   Collection Time: 04/01/16  1:40 AM  Result Value Ref Range Status   MRSA by PCR NEGATIVE NEGATIVE Final    Comment:  The GeneXpert MRSA Assay (FDA approved for NASAL specimens only), is one component of a comprehensive MRSA colonization surveillance program. It is not intended to diagnose MRSA infection nor to guide or monitor treatment for MRSA infections.     Medical History: Past Medical History  Diagnosis Date  . Cerebral palsy, hemiplegic ([place])     pt is intelligant  . Neurologic gait dysfunction   . Spinal stenosis   . Urge urinary incontinence   . Hypertension   . Arthritis   . Neurogenic bladder   . Hydronephrosis, right   . Chronic cystitis   . Gross hematuria   . RA (rheumatoid arthritis) (HCC)     advanced  . Flexion contracture joint of  multiple sites     secondary to advanced ra  . Slurred speech   . Cancer ([place]) skin ca  . Seasonal allergies   . UTI (lower urinary tract infection)   . Sepsis due to urinary tract infection ([place])   . Cough   . RA (rheumatoid arthritis) ([place])   . Edema, lower extremity   . Cataract   . History of ESBL E. coli infection     Medications:   (Not in a hospital admission) Assessment: CrCl = 41.2 ml/min  Goal of Therapy:  resolution of infection  Plan:  Expected duration 7 days with resolution of temperature and/or normalization of WBC   Zosyn 3.375 gm IV X 1 given on 6/10 @ 15:00. Zosyn 3.375 gm IV Q8H EI ordered to start 6/10 @ 21:00.   Gunnar Hereford D 04/19/2016,5:25 PM

## 2016-04-19 NOTE — ED Notes (Signed)
Report called to Anabella, RN  

## 2016-04-19 NOTE — ED Notes (Signed)
Pt presents via [facility] from [facility] with c/o green discharge from her vagina and possible UTI. Per EMS pt received tx for UTI approx 2 weeks ago and finished abx. EMS states that per [facility] pt had green discharge and was unable to determine from where due to "so much green discharge". Pt presents to ED at baseline, alert and oriented.

## 2016-04-20 DIAGNOSIS — L8990 Pressure ulcer of unspecified site, unspecified stage: Secondary | ICD-10-CM | POA: Insufficient documentation

## 2016-04-20 LAB — BASIC METABOLIC PANEL
ANION GAP: 12 (ref 5–15)
BUN: 19 mg/dL (ref 6–20)
CALCIUM: 8.5 mg/dL — AB (ref 8.9–10.3)
CO2: 22 mmol/L (ref 22–32)
Chloride: 99 mmol/L — ABNORMAL LOW (ref 101–111)
Creatinine, Ser: 0.4 mg/dL — ABNORMAL LOW (ref 0.44–1.00)
GFR calc Af Amer: >60 mL/min (ref >60)
GFR calc non Af Amer: >60 mL/min (ref >60)
GLUCOSE: 100 mg/dL — AB (ref 65–99)
POTASSIUM: 3.8 mmol/L (ref 3.5–5.1)
Sodium: 133 mmol/L — ABNORMAL LOW (ref 135–145)

## 2016-04-20 LAB — CBC
HEMATOCRIT: 27.9 % — AB (ref 35.0–47.0)
HEMOGLOBIN: 9.3 g/dL — AB (ref 12.0–16.0)
MCH: 32.1 pg (ref 26.0–34.0)
MCHC: 33.5 g/dL (ref 32.0–36.0)
MCV: 95.9 fL (ref 80.0–100.0)
Platelets: 363 10*3/uL (ref 150–440)
RBC: 2.9 MIL/uL — AB (ref 3.80–5.20)
RDW: 17 % — ABNORMAL HIGH (ref 11.5–14.5)
WBC: 13.7 10*3/uL — AB (ref 3.6–11.0)

## 2016-04-20 MED ORDER — SODIUM CHLORIDE 0.9 % IV SOLN
INTRAVENOUS | Status: DC
  Administered 2016-04-20 – 2016-04-21 (×3): via INTRAVENOUS

## 2016-04-20 NOTE — Plan of Care (Signed)
Problem: Health Behavior/Discharge Planning: Goal: Ability to manage health-related needs will improve Outcome: Not Progressing Pt requires assistance for health needs management

## 2016-04-20 NOTE — Progress Notes (Signed)
Called Dr Fritzi Mandes to clarify iv fluids order; Dr ordered NS @ 75

## 2016-04-20 NOTE — Progress Notes (Addendum)
Patient ID: Stacey Torres, female   DOB: 1929/02/19, 82 y.o.   MRN: [id number] [facility] NAME: Stacey Torres    MR#:  [id number]  DATE OF BIRTH:  1929/10/07  SUBJECTIVE:   Patient readmitted from nursing home with increasing weakness found to have sepsis at the emergency room. Patient appears very weak frail and malnourished. She has very poor appetite. She is bedbound. REVIEW OF SYSTEMS:   Review of Systems  Constitutional: Negative for fever, chills and weight loss.  HENT: Negative for ear discharge, ear pain and nosebleeds.   Eyes: Negative for blurred vision, pain and discharge.  Respiratory: Negative for sputum production, shortness of breath, wheezing and stridor.   Cardiovascular: Negative for chest pain, palpitations, orthopnea and PND.  Gastrointestinal: Negative for nausea, vomiting, abdominal pain and diarrhea.  Genitourinary: Positive for dysuria and urgency. Negative for frequency.  Musculoskeletal: Positive for back pain. Negative for joint pain.       Severe arthritis with contractors and upper extremity  Neurological: Positive for weakness. Negative for sensory change, speech change and focal weakness.  Psychiatric/Behavioral: Negative for depression and hallucinations. The patient is not nervous/anxious.   All other systems reviewed and are negative.  Tolerating Diet: Some Tolerating PT: Bedbound  DRUG ALLERGIES:   Allergies  Allergen Reactions  . Aspirin Other (See Comments)    Gi bleeding  . Macrobid [Nitrofurantoin] Other (See Comments)    Reaction:  Unknown     VITALS:  Blood pressure 115/57, pulse 106, temperature 98.3 F (36.8 C), temperature source Oral, resp. rate 20, height 5\' 5"  (1.651 m), weight 49.896 kg (110 lb), SpO2 96 %.  PHYSICAL EXAMINATION:   Physical Exam  GENERAL:  82 y.o.-year-old patient lying in the bed with no acute distress. Thin cachectic malnourished EYES:  Pupils equal, round, reactive to light and accommodation. No scleral icterus. Extraocular muscles intact.  HEENT: Head atraumatic, normocephalic. Oropharynx and nasopharynx clear. Poor dentition NECK:  Supple, no jugular venous distention. No thyroid enlargement, no tenderness.  LUNGS: Normal breath sounds bilaterally, no wheezing, rales, rhonchi. No use of accessory muscles of respiration.  CARDIOVASCULAR: S1, S2 normal. No murmurs, rubs, or gallops.  ABDOMEN: Soft, nontender, nondistended. Bowel sounds present. No organomegaly or mass.  EXTREMITIES: No cyanosis, clubbing or edema b/l.    NEUROLOGIC: Cranial nerves II through XII are intact. No focal Motor or sensory deficits b/l. Subjective weakness  PSYCHIATRIC:  patient is alert and oriented x 3.  SKIN: Patient has chronic ulcers secondary to being bedbound  LABORATORY PANEL:  CBC  Recent Labs Lab 04/20/16 0733  WBC 13.7*  HGB 9.3*  HCT 27.9*  PLT 363    Chemistries   Recent Labs Lab 04/19/16 1546 04/20/16 0621  NA 125* 133*  K 3.1* 3.8  CL 86* 99*  CO2 23 22  GLUCOSE 171* 100*  BUN 34* 19  CREATININE 0.51 0.40*  CALCIUM 8.6* 8.5*  AST 13*  --   ALT 8*  --   ALKPHOS 94  --   BILITOT 0.3  --    Cardiac Enzymes  Recent Labs Lab 04/19/16 1546  TROPONINI 0.03   RADIOLOGY:  Dg Chest Port 1 View  04/19/2016  CLINICAL DATA:  Sepsis EXAM: PORTABLE CHEST 1 VIEW COMPARISON:  07/06/2015 FINDINGS: Cardiomediastinal silhouette is stable. No infiltrate or pulmonary edema. Again noted chronic eventration of the left posterior hemidiaphragm. Stable degenerative changes bilateral shoulders. Mild left basilar atelectasis. IMPRESSION: No active  disease.  No significant change. Electronically Signed   By: Lahoma Crocker M.D.   On: 04/19/2016 15:39   ASSESSMENT AND PLAN:  Zer Gabor is a 82 y.o. female was admitted 2 weeks ago- with UTI, given 2 days of IV zosyn, and d/c on Cefuroxime- lives in NH and started feeling weak  again. Found to have sepsis in ER with UTI. On following Cx from last UTI- it shows ESBL - resistant to ceftriaxone.  * Sepsis due to UTI with ESBL  Found ESBL in last Cx report.  Will cont IV zosyn recieved IV fluids. Patient has his recurrent UTI secondary to neurogenic bladder. I'll have infectious disease see patient tomorrow. White count down from 24,000-13,000  * Hyponatremia due to dehydration.  Resolved  * Hypokalemia due to decreased oral intake. Resolved  * Weakness due to UTI and dehydration. Patient is bedbound at baseline and gets into wheelchair with assistance.  * HYpertension  Metoprolol  *Education officer, museum for discharge planning  Case discussed with Care Management/Social Worker. Management plans discussed with the patient, family and they are in agreement.  CODE STATUS: DO NOT RESUSCITATE  DVT Prophylaxis: Lovenox  TOTAL TIME TAKING CARE OF THIS PATIENT: 30s.  >50% time spent on counselling and coordination of care  POSSIBLE D/C IN 1-2  DAYS, DEPENDING ON CLINICAL CONDITION.  Note: This dictation was prepared with Dragon dictation along with smaller phrase technology. Any transcriptional errors that result from this process are unintentional.  Giles Currie M.D on 04/20/2016 at 10:56 AM  Between 7am to 6pm - Pager - [phone]  After 6pm go to [email or web address] - password [email or web address] [facility]  Office  [phone]  CC: Primary care physician; Viviana Simpler, MD

## 2016-04-20 NOTE — NC FL2 (Signed)
[place] LEVEL OF CARE SCREENING TOOL     IDENTIFICATION  Patient Name: Stacey Torres Birthdate: Nov 01, 1929 Sex: female Admission Date (Current Location): 04/19/2016  [place] and [place] Number:  Engineering geologist and Address:  [facility], [address], [place], [place] [address]      Provider Number: [id number]  Attending Physician Name and Address:  Fritzi Mandes, MD  Relative Name and Phone Number:       Current Level of Care: Hospital Recommended Level of Care: [place] Prior Approval Number:    Date Approved/Denied:   PASRR Number:  ([id number])  Discharge Plan: SNF    Current Diagnoses: Patient Active Problem List   Diagnosis Date Noted  . Pressure ulcer 04/20/2016  . ESBL (extended spectrum beta-lactamase) producing bacteria infection 04/19/2016  . UTI (lower urinary tract infection) 03/30/2016  . Protein-calorie malnutrition, severe ([place]) 07/07/2015  . Sepsis ([place]) 07/06/2015  . Upper GI bleed 07/06/2015  . Hyperkalemia 07/06/2015  . Endometrial thickening on ultra sound   . Malnutrition of moderate degree ([place]) 04/02/2015  . Rheumatoid arthritis ([place]) 04/01/2015  . Essential hypertension 04/01/2015  . Hydronephrosis of right kidney 11/21/2013    Orientation RESPIRATION BLADDER Height & Weight     Self, Time, Situation, Place  Normal Incontinent Weight: 110 lb (49.896 kg) Height:  5\' 5"  (165.1 cm)  BEHAVIORAL SYMPTOMS/MOOD NEUROLOGICAL BOWEL NUTRITION STATUS   (None)  (None) Incontinent Diet (Regular)  AMBULATORY STATUS COMMUNICATION OF NEEDS Skin   Extensive Assist Verbally Other (Comment) (Pressure Ulcer Stage II Mid Sacrum & Pressure Ulcer Stage I Right Buttocks )                       Personal Care Assistance Level of Assistance  Bathing, Feeding, Dressing Bathing Assistance: Limited assistance Feeding assistance: Independent Dressing Assistance: Limited assistance      Functional Limitations Info  Hearing, Sight, Speech Sight Info: Adequate Hearing Info: Adequate Speech Info: Adequate    SPECIAL CARE FACTORS FREQUENCY  PT (By licensed PT), OT (By licensed OT)     PT Frequency:  (5) OT Frequency:  (5)            Contractures      Additional Factors Info  Isolation Precautions, Allergies, Code Status Code Status Info:  (DNR) Allergies Info:  (Aspirin & Macrobid)     Isolation Precautions Info:  (Contact precautions)     Current Medications (04/20/2016):  This is the current hospital active medication list Current Facility-Administered Medications  Medication Dose Route Frequency Provider Last Rate Last Dose  . ALPRAZolam Duanne Moron) tablet 0.25 mg  0.25 mg Oral BID PRN Vaughan Basta, MD      . DULoxetine (CYMBALTA) DR capsule 60 mg  60 mg Oral Daily Vaughan Basta, MD   60 mg at 04/20/16 1014  . feeding supplement (ENSURE ENLIVE) (ENSURE ENLIVE) liquid 237 mL  237 mL Oral BID BM Vaughan Basta, MD   237 mL at 04/20/16 1016  . fluticasone (FLONASE) 50 MCG/ACT nasal spray 2 spray  2 spray Each Nare BID Vaughan Basta, MD   2 spray at [id number]  . folic acid (FOLVITE) tablet 1 mg  1 mg Oral Daily Vaughan Basta, MD   1 mg at 04/20/16 1017  . heparin injection 5,000 Units  5,000 Units Subcutaneous Q8H Vaughan Basta, MD   5,000 Units at 04/20/16 0527  . HYDROcodone-acetaminophen (NORCO/VICODIN) 5-325 MG per tablet 2 tablet  2 tablet  Oral BID Vaughan Basta, MD   2 tablet at 04/20/16 1015  . hydroxychloroquine (PLAQUENIL) tablet 200 mg  200 mg Oral BID Vaughan Basta, MD   200 mg at 04/20/16 1015  . metoprolol succinate (TOPROL-XL) 24 hr tablet 50 mg  50 mg Oral Martin Majestic, MD   50 mg at 04/20/16 1017  . mirtazapine (REMERON) tablet 30 mg  30 mg Oral QHS Vaughan Basta, MD   30 mg at 04/19/16 2323  . montelukast (SINGULAIR) tablet 10 mg  10 mg Oral QPM  Vaughan Basta, MD   10 mg at 04/19/16 2323  . ondansetron (ZOFRAN) tablet 4 mg  4 mg Oral Q4H PRN Vaughan Basta, MD   4 mg at 04/20/16 0544  . pantoprazole (PROTONIX) EC tablet 40 mg  40 mg Oral Daily Vaughan Basta, MD   40 mg at 04/20/16 1015  . piperacillin-tazobactam (ZOSYN) IVPB 3.375 g  3.375 g Intravenous Q8H Vaughan Basta, MD   3.375 g at 04/20/16 0527  . polyethylene glycol (MIRALAX / GLYCOLAX) packet 17 g  17 g Oral Daily PRN Vaughan Basta, MD      . potassium chloride SA (K-DUR,KLOR-CON) CR tablet 20 mEq  20 mEq Oral BID Vaughan Basta, MD   20 mEq at 04/20/16 1015  . primidone (MYSOLINE) tablet 50 mg  50 mg Oral BID Vaughan Basta, MD   50 mg at 04/20/16 1016  . traZODone (DESYREL) tablet 50 mg  50 mg Oral QHS Vaughan Basta, MD   50 mg at 04/19/16 2323  . vitamin C (ASCORBIC ACID) tablet 1,000 mg  1,000 mg Oral Daily Vaughan Basta, MD   1,000 mg at 04/20/16 1014     Discharge Medications: Please see discharge summary for a list of discharge medications.  Relevant Imaging Results:  Relevant Lab Results:   Additional Information  (SSN [id number])  Lorenso Quarry Emonnie Cannady, LCSW

## 2016-04-21 LAB — URINE CULTURE

## 2016-04-21 MED ORDER — CEFAZOLIN SODIUM 1-5 GM-% IV SOLN
1.0000 g | Freq: Three times a day (TID) | INTRAVENOUS | Status: DC
  Administered 2016-04-21 – 2016-04-22 (×3): 1 g via INTRAVENOUS
  Filled 2016-04-21 (×7): qty 50

## 2016-04-21 NOTE — Clinical Documentation Improvement (Signed)
Internal Medicine  Can the diagnosis of "chronic ulcers secondary to being bedbound" be further specified? Please document response in next progress note. Thank you   Pressure Ulcer - please document site, stage and if present on admission if in agreement with Dietary  Venous Stasis Ulcer  Other Type of Ulcer  Clinically Undetermined  Supporting Information:  "pressure ulcer stage I and II noted on buttock and sacrum" documented in Nutritional Consult along with the diagnosis of Severe Malnutrition with BMI of 18  Please exercise your independent, professional judgment when responding. A specific answer is not anticipated or expected.  Thank You,  Zoila Shutter RN, BSN, [place] [phone]; Cell: [phone]

## 2016-04-21 NOTE — Progress Notes (Signed)
Discussed this patient with Dr. Ola Spurr.    Stacey Torres is known to me from previous consult with the exact same presentation back in 5/25 2016. At that time, a Foley catheter was placed and a follow-up renal ultrasound 1 month later showed no improvement in her chronic right hydronephrosis.  Her chronic hydronephrosis has been adequately worked up in the past. She is status post right ureteroscopy in 11/2013 by Dr. Risa Grill without any clear obstruction or reflux.  No intraluminal pathology. On review of previous imaging, this had to appears to be stable severe hydro/ right renal atrophy at least since 2014, likely longer.    As such, no further work up or treatment indicated.  Observation is preferred.    Abx and possible suppression per ID.  Of note, patient is very difficulty to catheterize and therefore poor candidate for indwelling Foley catheter.  If she is is not in florid retention, would advise against chronic bladder drainage.    Hollice Espy, MD

## 2016-04-21 NOTE — Progress Notes (Signed)
Initial Nutrition Assessment  DOCUMENTATION CODES:   Severe malnutrition in context of chronic illness  INTERVENTION:  -Monitor intake, recommend dysphagia 3 diet secondary to pt with limited teeth.   -Pt does not want ensure so will discontinue at this time but add mightyshake q daily and magic cup BID for added nutrition -Nursing notified to assist pt with meal ordering, tray set up and assistance eating as host/hostess staff will not enter room due to pt being on isolation. This Probation officer ordered pt breakfast tray this am   NUTRITION DIAGNOSIS:   Malnutrition related to chronic illness as evidenced by energy intake < or equal to 75% for > or equal to 1 month, severe depletion of muscle mass.    GOAL:   Patient will meet greater than or equal to 90% of their needs    MONITOR:   PO intake, Supplement acceptance  REASON FOR ASSESSMENT:   Malnutrition Screening Tool    ASSESSMENT:     Pt admitted with weakness, sepsis, poor po intake, UTI  Past Medical History  Diagnosis Date  . Cerebral palsy, hemiplegic ([place])     pt is intelligant  . Neurologic gait dysfunction   . Spinal stenosis   . Urge urinary incontinence   . Hypertension   . Arthritis   . Neurogenic bladder   . Hydronephrosis, right   . Chronic cystitis   . Gross hematuria   . RA (rheumatoid arthritis) ([facility])     advanced  . Flexion contracture joint of multiple sites     secondary to advanced ra  . Slurred speech   . Cancer ([place]) skin ca  . Seasonal allergies   . UTI (lower urinary tract infection)   . Sepsis due to urinary tract infection ([place])   . Cough   . RA (rheumatoid arthritis) ([place])   . Edema, lower extremity   . Cataract   . History of ESBL E. coli infection    Pt reports appetite has been decreased for the last several months, only eating bites at meal times. Pt reports does not like the food at where I live. Does not like ensure per pt. Has limited teeth. No breakfast ordered this  am  Medications reviewed: folic acid, remeron, vit C, NS at 9ml/hr, miralax  Labs reviewed: Na 133, glucose 100  Nutrition-Focused physical exam completed. Findings are moderate fat depletion, mild/moderate to severe muscle depletion, and unable to assess edema.    Diet Order:  DIET DYS 3 Room service appropriate?: Yes; Fluid consistency:: Thin  Skin:   (pressure ulcer stage I and II noted on buttock and sacrum)  Last BM:  6/10  Height:   Ht Readings from Last 1 Encounters:  04/19/16 5\' 5"  (1.651 m)    Weight: pt reports wt loss but unsure how much.  Per wt encounters shows wt gain  Wt Readings from Last 1 Encounters:  04/19/16 110 lb (49.896 kg)   Wt Readings from Last 10 Encounters:  04/19/16  110 lb (49.896 kg)  04/01/16  106 lb 14.4 oz (48.49 kg)  09/24/15  105 lb (47.628 kg)  07/06/15  95 lb (43.092 kg)  04/02/15  113 lb 12.8 oz (51.619 kg)  01/12/14  124 lb (56.246 kg)  11/18/13  135 lb (61.236 kg)  Ideal Body Weight:     BMI:  Body mass index is 18.31 kg/(m^2).  Estimated Nutritional Needs:   Kcal:  [id number] kcals/d  Protein:  74-98 g/d  Fluid:  1.4-1.7 L/d  EDUCATION  NEEDS:   No education needs identified at this time  Julliette Frentz B. Zenia Resides, [place], [place] (pager) Weekend/On-Call pager [phone])

## 2016-04-21 NOTE — Plan of Care (Signed)
Problem: Activity: Goal: Risk for activity intolerance will decrease Outcome: Not Progressing Pt is bed bound with contractures.

## 2016-04-21 NOTE — Progress Notes (Signed)
Patient ID: Barb Patridge, female   DOB: Aug 09, 1929, 82 y.o.   MRN: [id number] [place] [facility] [place] NAME: Janari Avis    MR#:  [id number]  DATE OF BIRTH:  05-05-1929  SUBJECTIVE:   Patient readmitted from nursing home with increasing weakness found to have sepsis at the emergency room. Patient appears very weak frail and malnourished. She has very poor appetite. She is bedbound.More awake this morning. Trying to eat some breakfast REVIEW OF SYSTEMS:   Review of Systems  Constitutional: Negative for fever, chills and weight loss.  HENT: Negative for ear discharge, ear pain and nosebleeds.   Eyes: Negative for blurred vision, pain and discharge.  Respiratory: Negative for sputum production, shortness of breath, wheezing and stridor.   Cardiovascular: Negative for chest pain, palpitations, orthopnea and PND.  Gastrointestinal: Negative for nausea, vomiting, abdominal pain and diarrhea.  Genitourinary: Positive for dysuria and urgency. Negative for frequency.  Musculoskeletal: Positive for back pain. Negative for joint pain.       Severe arthritis with contractors and upper extremity  Neurological: Positive for weakness. Negative for sensory change, speech change and focal weakness.  Psychiatric/Behavioral: Negative for depression and hallucinations. The patient is not nervous/anxious.   All other systems reviewed and are negative.  Tolerating Diet:yes Tolerating PT: Bedbound  DRUG ALLERGIES:   Allergies  Allergen Reactions  . Aspirin Other (See Comments)    Gi bleeding  . Macrobid [Nitrofurantoin] Other (See Comments)    Reaction:  Unknown     VITALS:  Blood pressure 136/73, pulse 85, temperature 97.9 F (36.6 C), temperature source Axillary, resp. rate 16, height 5\' 5"  (1.651 m), weight 49.896 kg (110 lb), SpO2 95 %.  PHYSICAL EXAMINATION:   Physical Exam  GENERAL:  82 y.o.-year-old patient lying in the bed with  no acute distress. Thin cachectic malnourished EYES: Pupils equal, round, reactive to light and accommodation. No scleral icterus. Extraocular muscles intact.  HEENT: Head atraumatic, normocephalic. Oropharynx and nasopharynx clear. Poor dentition NECK:  Supple, no jugular venous distention. No thyroid enlargement, no tenderness.  LUNGS: Normal breath sounds bilaterally, no wheezing, rales, rhonchi. No use of accessory muscles of respiration.  CARDIOVASCULAR: S1, S2 normal. No murmurs, rubs, or gallops.  ABDOMEN: Soft, nontender, nondistended. Bowel sounds present. No organomegaly or mass.  EXTREMITIES: No cyanosis, clubbing or edema b/l.    NEUROLOGIC: Cranial nerves II through XII are intact. No focal Motor or sensory deficits b/l. Subjective weakness  PSYCHIATRIC:  patient is alert and oriented x 3.  SKIN: Patient has chronic ulcers secondary to being bedbound  patient has heel protectors bilateral lower extremity LABORATORY PANEL:  CBC  Recent Labs Lab 04/20/16 0733  WBC 13.7*  HGB 9.3*  HCT 27.9*  PLT 363    Chemistries   Recent Labs Lab 04/19/16 1546 04/20/16 0621  NA 125* 133*  K 3.1* 3.8  CL 86* 99*  CO2 23 22  GLUCOSE 171* 100*  BUN 34* 19  CREATININE 0.51 0.40*  CALCIUM 8.6* 8.5*  AST 13*  --   ALT 8*  --   ALKPHOS 94  --   BILITOT 0.3  --    Cardiac Enzymes  Recent Labs Lab 04/19/16 1546  TROPONINI 0.03   RADIOLOGY:  Dg Chest Port 1 View  04/19/2016  CLINICAL DATA:  Sepsis EXAM: PORTABLE CHEST 1 VIEW COMPARISON:  07/06/2015 FINDINGS: Cardiomediastinal silhouette is stable. No infiltrate or pulmonary edema. Again noted chronic eventration of the left  posterior hemidiaphragm. Stable degenerative changes bilateral shoulders. Mild left basilar atelectasis. IMPRESSION: No active disease.  No significant change. Electronically Signed   By: Lahoma Crocker M.D.   On: 04/19/2016 15:39   ASSESSMENT AND PLAN:  Sharmin Nomura is a 82 y.o. female was admitted 2  weeks ago- with UTI, given 2 days of IV zosyn, and d/c on Cefuroxime- lives in NH and started feeling weak again. Found to have sepsis in ER with UTI. On following Cx from last UTI- it shows ESBL - resistant to ceftriaxone.  * Sepsis due to UTI with ESBL  Found ESBL in last Cx report.  Will cont IV zosyn. -Urine culture growing Proteus mirabilis ID pending. Patient has his recurrent UTI secondary to neurogenic bladder. -infectious disease to see today. Spoke to Dr. Ola Spurr. White count down from 24,000---->13,000  * Hyponatremia due to dehydration.  Resolved  * Hypokalemia due to decreased oral intake. Resolved  * Weakness due to UTI and dehydration.  Patient is bedbound at baseline and gets into wheelchair with assistance.  * Hypertension  Metoprolol  *Education officer, museum for discharge planning to [facility] in am  Case discussed with Care Management/Social Worker. Management plans discussed with the patient, family and they are in agreement. D/w niece Darrick Meigs  CODE STATUS: DO NOT RESUSCITATE  DVT Prophylaxis: Lovenox  TOTAL TIME TAKING CARE OF THIS PATIENT: 73s.  >50% time spent on counselling and coordination of care  POSSIBLE D/C IN 1-2  DAYS, DEPENDING ON CLINICAL CONDITION.  Note: This dictation was prepared with Dragon dictation along with smaller phrase technology. Any transcriptional errors that result from this process are unintentional.  Reynard Christoffersen M.D on 04/21/2016 at 12:17 PM  Between 7am to 6pm - Pager - [phone]  After 6pm go to [email or web address] - password [email or web address] [place] [facility]  Office  [phone]  CC: Primary care physician; Viviana Simpler, MD

## 2016-04-21 NOTE — Progress Notes (Signed)
Attention: Social Worker/Care Management - Patient niece would like for patient to be transferred via EMS.

## 2016-04-21 NOTE — Consult Note (Signed)
[facility] Infectious Disease     Reason for Consult: Recurrent UTI    Referring Physician: Nicholes Mango Date of Admission:  04/19/2016   Principal Problem:   ESBL (extended spectrum beta-lactamase) producing bacteria infection Active Problems:   Sepsis ([place])   Pressure ulcer   HPI: Stacey Torres is a 82 y.o. female with recurrent UTIs in setting of Cerebral palsy, urinary incontinence, largely bedbound. She was admitted May 21-23 and had ESBL E coli noted on UCX with UA showing TNTC WBC.  She was discharged on cefuroxime (Cx not back at time of DC). I reviewed prior notes- has been seen by urology in past for chronic hydroureter and had CT scan and cysto. Has not had follow up since then.   Past Medical History  Diagnosis Date  . Cerebral palsy, hemiplegic ([place])     pt is intelligant  . Neurologic gait dysfunction   . Spinal stenosis   . Urge urinary incontinence   . Hypertension   . Arthritis   . Neurogenic bladder   . Hydronephrosis, right   . Chronic cystitis   . Gross hematuria   . RA (rheumatoid arthritis) (H[facility])     advanced  . Flexion contracture joint of multiple sites     secondary to advanced ra  . Slurred speech   . Cancer ([place]) skin ca  . Seasonal allergies   . UTI (lower urinary tract infection)   . Sepsis due to urinary tract infection ([place])   . Cough   . RA (rheumatoid arthritis) ([place])   . Edema, lower extremity   . Cataract   . History of ESBL E. coli infection    Past Surgical History  Procedure Laterality Date  . Total knee arthroplasty Right 2005  . Cholecystectomy  1995  . Cystoscopy with retrograde pyelogram, ureteroscopy and stent placement Bilateral 11/21/2013    Procedure: CYSTOSCOPY WITH RIGHT RETROGRADE PYELOGRAM, RIGHT FLEXIBLE and rigid URETEROSCOPY  PLACEMENT;  Surgeon: Bernestine Amass, MD;  Location: [facility];  Service: Urology;  Laterality: Bilateral;  . Cystogram  11/21/2013    Procedure: CYSTOGRAM;  Surgeon:  Bernestine Amass, MD;  Location: [facility];  Service: Urology;;  . Dg toes rt foot multiple specif ([facility] hx)    . Hysteroscopy w/d&c N/A 05/09/2015    Procedure: DILATATION AND CURETTAGE Pol[place]ann [place], Myosure;  Surgeon: Gillis Ends, MD;  Location: [facility];  Service: Gynecology;  Laterality: N/A;  . Joint replacement    . Cataract extraction w/phaco Right 09/24/2015    Procedure: CATARACT EXTRACTION PHACO AND INTRAOCULAR LENS PLACEMENT (IOC);  Surgeon: Estill Cotta, MD;  Location: [facility];  Service: Ophthalmology;  Laterality: Right;  [place] 01:53 AP% 26.0 CDE 50.20 fluid pack lot #[id number]   Social History  Substance Use Topics  . Smoking status: Former Smoker    Quit date: 11/18/1958  . Smokeless tobacco: Never Used  . Alcohol Use: No   Family History  Problem Relation Age of Onset  . CAD Mother   . Deep vein thrombosis Father   . Breast cancer Sister     Allergies:  Allergies  Allergen Reactions  . Aspirin Other (See Comments)    Gi bleeding  . Macrobid [Nitrofurantoin] Other (See Comments)    Reaction:  Unknown     Current antibiotics: Antibiotics Given (last 72 hours)    Date/Time Action Medication Dose Rate   04/19/16 2318 Given   piperacillin-tazobactam (ZOSYN) IVPB 3.375 g 3.375 g 12.5 mL/hr  04/19/16 2327 Given   hydroxychloroquine (PLAQUENIL) tablet 200 mg 200 mg    04/20/16 0527 Given   piperacillin-tazobactam (ZOSYN) IVPB 3.375 g 3.375 g 12.5 mL/hr   04/20/16 1015 Given   hydroxychloroquine (PLAQUENIL) tablet 200 mg 200 mg    04/20/16 1510 Given   piperacillin-tazobactam (ZOSYN) IVPB 3.375 g 3.375 g 12.5 mL/hr   04/20/16 2304 Given   piperacillin-tazobactam (ZOSYN) IVPB 3.375 g 3.375 g 12.5 mL/hr   04/20/16 2313 Given   hydroxychloroquine (PLAQUENIL) tablet 200 mg 200 mg    04/21/16 0651 Given   piperacillin-tazobactam (ZOSYN) IVPB 3.375 g 3.375 g 12.5 mL/hr      MEDICATIONS: . DULoxetine  60 mg Oral Daily  .  fluticasone  2 spray Each Nare BID  . folic acid  1 mg Oral Daily  . heparin  5,000 Units Subcutaneous Q8H  . HYDROcodone-acetaminophen  2 tablet Oral BID  . hydroxychloroquine  200 mg Oral BID  . metoprolol succinate  50 mg Oral BH-q7a  . mirtazapine  30 mg Oral QHS  . montelukast  10 mg Oral QPM  . pantoprazole  40 mg Oral Daily  . piperacillin-tazobactam (ZOSYN)  IV  3.375 g Intravenous Q8H  . potassium chloride  20 mEq Oral BID  . primidone  50 mg Oral BID  . traZODone  50 mg Oral QHS  . vitamin C  1,000 mg Oral Daily    Review of Systems - unable to obtain   OBJECTIVE: Temp:  [97.9 F (36.6 C)-98.9 F (37.2 C)] 97.9 F (36.6 C) (06/12 0623) Pulse Rate:  [84-85] 85 (06/12 0623) Resp:  [16-18] 16 (06/12 0623) BP: (127-136)/(58-73) 136/73 mmHg (06/12 0623) SpO2:  [95 %-99 %] 95 % (06/12 5277) Physical Exam  Constitutional:  Frail, unable to provide history, lying in bed  HENT: [place]/AT, PERRLA, no scleral icterus Mouth/Throat: Oropharynx is clear and moist. No oropharyngeal exudate.  Cardiovascular: Normal rate, Pulmonary/Chest: Effort normal and breath sounds normal. No respiratory distress.  has no wheezes.  Neck = supple, no nuchal rigidity Abdominal: Soft. Bowel sounds are normal.  exhibits no distension. There is no tenderness.  Lymphadenopathy: no cervical adenopathy. No axillary adenopathy Neurological: disoriented Skin: Skin is warm and dry. No rash noted. No erythema.  Ext bil LE leg contractures   LABS: Results for orders placed or performed during the hospital encounter of 04/19/16 (from the past 48 hour(s))  Lactic acid, plasma     Status: Abnormal   Collection Time: 04/19/16  3:04 PM  Result Value Ref Range   Lactic Acid, Venous 2.3 (HH) 0.5 - 2.0 mmol/L    Comment: CRITICAL RESULT CALLED TO, READ BACK BY AND VERIFIED WITH DONALD SWEENEY ON 04/19/16 AT 1553 [facility]   CBC WITH DIFFERENTIAL     Status: Abnormal   Collection Time: 04/19/16  3:04 PM  Result  Value Ref Range   WBC 24.6 (H) 3.6 - 11.0 K/uL   RBC 3.59 (L) 3.80 - 5.20 MIL/uL   Hemoglobin 12.0 12.0 - 16.0 g/dL   HCT 35.6 35.0 - 47.0 %   MCV 99.3 80.0 - 100.0 fL   MCH 33.4 26.0 - 34.0 pg   MCHC 33.7 32.0 - 36.0 g/dL   RDW 16.7 (H) 11.5 - 14.5 %   Platelets 422 150 - 440 K/uL   Neutrophils Relative % 96% %   Neutro Abs 23.5 (H) 1.4 - 6.5 K/uL   Lymphocytes Relative 1% %   Lymphs Abs 0.3 (L) 1.0 - 3.6 K/uL  Monocytes Relative 3% %   Monocytes Absolute 0.8 0.2 - 0.9 K/uL   Eosinophils Relative 0% %   Eosinophils Absolute 0.0 0 - 0.7 K/uL   Basophils Relative 0% %   Basophils Absolute 0.1 0 - 0.1 K/uL  Blood Culture (routine x 2)     Status: None (Preliminary result)   Collection Time: 04/19/16  3:04 PM  Result Value Ref Range   Specimen Description BLOOD LEFT WRIST    Special Requests BOTTLES DRAWN AEROBIC AND ANAEROBIC 1CC    Culture NO GROWTH 2 DAYS    Report Status PENDING   Urinalysis complete, with microscopic ([facility] only)     Status: Abnormal   Collection Time: 04/19/16  3:04 PM  Result Value Ref Range   Color, Urine YELLOW (A) YELLOW   APPearance CLOUDY (A) CLEAR   Glucose, UA NEGATIVE NEGATIVE mg/dL   Bilirubin Urine NEGATIVE NEGATIVE   Ketones, ur NEGATIVE NEGATIVE mg/dL   Specific Gravity, Urine 1.008 1.005 - 1.030   Hgb urine dipstick 1+ (A) NEGATIVE   pH 8.0 5.0 - 8.0   Protein, ur 100 (A) NEGATIVE mg/dL   Nitrite NEGATIVE NEGATIVE   Leukocytes, UA 3+ (A) NEGATIVE   RBC / HPF TOO NUMEROUS TO COUNT 0 - 5 RBC/hpf   WBC, UA TOO NUMEROUS TO COUNT 0 - 5 WBC/hpf   Bacteria, UA FEW (A) NONE SEEN   Squamous Epithelial / LPF 0-5 (A) NONE SEEN   WBC Clumps PRESENT    Mucous PRESENT   Urine culture     Status: Abnormal   Collection Time: 04/19/16  3:04 PM  Result Value Ref Range   Specimen Description URINE, RANDOM    Special Requests NONE    Culture >=100,000 COLONIES/mL PROTEUS MIRABILIS (A)    Report Status 04/21/2016 FINAL    Organism ID, Bacteria  PROTEUS MIRABILIS (A)       Susceptibility   Proteus mirabilis - MIC*    AMPICILLIN >=32 RESISTANT Resistant     CEFAZOLIN <=4 SENSITIVE Sensitive     CEFTRIAXONE <=1 SENSITIVE Sensitive     CIPROFLOXACIN >=4 RESISTANT Resistant     GENTAMICIN <=1 SENSITIVE Sensitive     IMIPENEM 2 SENSITIVE Sensitive     NITROFURANTOIN 128 RESISTANT Resistant     TRIMETH/SULFA >=320 RESISTANT Resistant     AMPICILLIN/SULBACTAM 4 SENSITIVE Sensitive     PIP/TAZO <=4 SENSITIVE Sensitive     * >=100,000 COLONIES/mL PROTEUS MIRABILIS  Type and screen [facility]     Status: None   Collection Time: 04/19/16  3:46 PM  Result Value Ref Range   ABO/RH(D) O POS    Antibody Screen NEG    Sample Expiration 04/22/2016   Blood Culture (routine x 2)     Status: None (Preliminary result)   Collection Time: 04/19/16  3:46 PM  Result Value Ref Range   Specimen Description BLOOD RIGHT ANTECUBITAL    Special Requests BOTTLES DRAWN AEROBIC AND ANAEROBIC  2CC    Culture NO GROWTH 2 DAYS    Report Status PENDING   Protime-INR     Status: None   Collection Time: 04/19/16  3:46 PM  Result Value Ref Range   Prothrombin Time 14.6 11.4 - 15.0 seconds   INR 1.12   APTT     Status: Abnormal   Collection Time: 04/19/16  3:46 PM  Result Value Ref Range   aPTT 37 (H) 24 - 36 seconds    Comment:  IF BASELINE aPTT IS ELEVATED, SUGGEST PATIENT RISK ASSESSMENT BE USED TO DETERMINE APPROPRIATE ANTICOAGULANT THERAPY.   Troponin I     Status: None   Collection Time: 04/19/16  3:46 PM  Result Value Ref Range   Troponin I 0.03 <0.031 ng/mL    Comment:        NO INDICATION OF MYOCARDIAL INJURY.   Brain natriuretic peptide     Status: None   Collection Time: 04/19/16  3:46 PM  Result Value Ref Range   B Natriuretic Peptide 63.0 0.0 - 100.0 pg/mL  Comprehensive metabolic panel     Status: Abnormal   Collection Time: 04/19/16  3:46 PM  Result Value Ref Range   Sodium 125 (L) 135 - 145 mmol/L    Potassium 3.1 (L) 3.5 - 5.1 mmol/L   Chloride 86 (L) 101 - 111 mmol/L   CO2 23 22 - 32 mmol/L   Glucose, Bld 171 (H) 65 - 99 mg/dL   BUN 34 (H) 6 - 20 mg/dL   Creatinine, Ser 0.51 0.44 - 1.00 mg/dL   Calcium 8.6 (L) 8.9 - 10.3 mg/dL   Total Protein 6.9 6.5 - 8.1 g/dL   Albumin 2.8 (L) 3.5 - 5.0 g/dL   AST 13 (L) 15 - 41 U/L   ALT 8 (L) 14 - 54 U/L   Alkaline Phosphatase 94 38 - 126 U/L   Total Bilirubin 0.3 0.3 - 1.2 mg/dL   GFR calc non Af Amer >60 >60 mL/min   GFR calc Af Amer >60 >60 mL/min    Comment: (NOTE) The eGFR has been calculated using the CKD EPI equation. This calculation has not been validated in all clinical situations. eGFR's persistently <60 mL/min signify possible Chronic Kidney Disease.    Anion gap 16 (H) 5 - 15  Lactic acid, plasma     Status: None   Collection Time: 04/19/16  6:42 PM  Result Value Ref Range   Lactic Acid, Venous 1.5 0.5 - 2.0 mmol/L  Basic metabolic panel     Status: Abnormal   Collection Time: 04/20/16  6:21 AM  Result Value Ref Range   Sodium 133 (L) 135 - 145 mmol/L    Comment: RESULTS VERIFIED BY REPEAT TESTING   Potassium 3.8 3.5 - 5.1 mmol/L   Chloride 99 (L) 101 - 111 mmol/L   CO2 22 22 - 32 mmol/L   Glucose, Bld 100 (H) 65 - 99 mg/dL   BUN 19 6 - 20 mg/dL   Creatinine, Ser 0.40 (L) 0.44 - 1.00 mg/dL   Calcium 8.5 (L) 8.9 - 10.3 mg/dL   GFR calc non Af Amer >60 >60 mL/min   GFR calc Af Amer >60 >60 mL/min    Comment: (NOTE) The eGFR has been calculated using the CKD EPI equation. This calculation has not been validated in all clinical situations. eGFR's persistently <60 mL/min signify possible Chronic Kidney Disease.    Anion gap 12 5 - 15  CBC     Status: Abnormal   Collection Time: 04/20/16  7:33 AM  Result Value Ref Range   WBC 13.7 (H) 3.6 - 11.0 K/uL   RBC 2.90 (L) 3.80 - 5.20 MIL/uL   Hemoglobin 9.3 (L) 12.0 - 16.0 g/dL   HCT 27.9 (L) 35.0 - 47.0 %   MCV 95.9 80.0 - 100.0 fL   MCH 32.1 26.0 - 34.0 pg   MCHC  33.5 32.0 - 36.0 g/dL   RDW 17.0 (H) 11.5 - 14.5 %  Platelets 363 150 - 440 K/uL   No components found for: ESR, C REACTIVE PROTEIN MICRO: Recent Results (from the past 720 hour(s))  Urine culture     Status: Abnormal   Collection Time: 03/30/16 10:23 AM  Result Value Ref Range Status   Specimen Description URINE, RANDOM  Final   Special Requests NONE  Final   Culture (A)  Final    >=100,000 COLONIES/mL ESCHERICHIA COLI Confirmed Extended Spectrum Beta-Lactamase Producer (ESBL) Performed at [facility]    Report Status 04/02/2016 FINAL  Final   Organism ID, Bacteria ESCHERICHIA COLI (A)  Final      Susceptibility   Escherichia coli - MIC*    AMPICILLIN >=32 RESISTANT Resistant     CEFAZOLIN >=64 RESISTANT Resistant     CEFTRIAXONE >=64 RESISTANT Resistant     CIPROFLOXACIN >=4 RESISTANT Resistant     GENTAMICIN <=1 SENSITIVE Sensitive     IMIPENEM <=0.25 SENSITIVE Sensitive     NITROFURANTOIN <=16 SENSITIVE Sensitive     TRIMETH/SULFA >=320 RESISTANT Resistant     AMPICILLIN/SULBACTAM >=32 RESISTANT Resistant     PIP/TAZO 16 SENSITIVE Sensitive     Extended ESBL POSITIVE Resistant     * >=100,000 COLONIES/mL ESCHERICHIA COLI  Urine culture     Status: Abnormal   Collection Time: 03/30/16 10:23 AM  Result Value Ref Range Status   Specimen Description URINE, RANDOM  Final   Special Requests Normal  Final   Culture (A)  Final    >=100,000 COLONIES/mL ESCHERICHIA COLI Confirmed Extended Spectrum Beta-Lactamase Producer (ESBL) Performed at [facility]    Report Status 04/02/2016 FINAL  Final   Organism ID, Bacteria ESCHERICHIA COLI (A)  Final      Susceptibility   Escherichia coli - MIC*    AMPICILLIN >=32 RESISTANT Resistant     CEFAZOLIN >=64 RESISTANT Resistant     CEFTRIAXONE >=64 RESISTANT Resistant     CIPROFLOXACIN >=4 RESISTANT Resistant     GENTAMICIN <=1 SENSITIVE Sensitive     IMIPENEM <=0.25 SENSITIVE Sensitive     NITROFURANTOIN <=16  SENSITIVE Sensitive     TRIMETH/SULFA >=320 RESISTANT Resistant     AMPICILLIN/SULBACTAM >=32 RESISTANT Resistant     PIP/TAZO >=128 RESISTANT Resistant     Extended ESBL POSITIVE Resistant     * >=100,000 COLONIES/mL ESCHERICHIA COLI  MRSA PCR Screening     Status: None   Collection Time: 04/01/16  1:40 AM  Result Value Ref Range Status   MRSA by PCR NEGATIVE NEGATIVE Final    Comment:        The GeneXpert MRSA Assay (FDA approved for NASAL specimens only), is one component of a comprehensive MRSA colonization surveillance program. It is not intended to diagnose MRSA infection nor to guide or monitor treatment for MRSA infections.   Blood Culture (routine x 2)     Status: None (Preliminary result)   Collection Time: 04/19/16  3:04 PM  Result Value Ref Range Status   Specimen Description BLOOD LEFT WRIST  Final   Special Requests BOTTLES DRAWN AEROBIC AND ANAEROBIC 1CC  Final   Culture NO GROWTH 2 DAYS  Final   Report Status PENDING  Incomplete  Urine culture     Status: Abnormal   Collection Time: 04/19/16  3:04 PM  Result Value Ref Range Status   Specimen Description URINE, RANDOM  Final   Special Requests NONE  Final   Culture >=100,000 COLONIES/mL PROTEUS MIRABILIS (A)  Final   Report Status 04/21/2016  FINAL  Final   Organism ID, Bacteria PROTEUS MIRABILIS (A)  Final      Susceptibility   Proteus mirabilis - MIC*    AMPICILLIN >=32 RESISTANT Resistant     CEFAZOLIN <=4 SENSITIVE Sensitive     CEFTRIAXONE <=1 SENSITIVE Sensitive     CIPROFLOXACIN >=4 RESISTANT Resistant     GENTAMICIN <=1 SENSITIVE Sensitive     IMIPENEM 2 SENSITIVE Sensitive     NITROFURANTOIN 128 RESISTANT Resistant     TRIMETH/SULFA >=320 RESISTANT Resistant     AMPICILLIN/SULBACTAM 4 SENSITIVE Sensitive     PIP/TAZO <=4 SENSITIVE Sensitive     * >=100,000 COLONIES/mL PROTEUS MIRABILIS  Blood Culture (routine x 2)     Status: None (Preliminary result)   Collection Time: 04/19/16  3:46 PM   Result Value Ref Range Status   Specimen Description BLOOD RIGHT ANTECUBITAL  Final   Special Requests BOTTLES DRAWN AEROBIC AND ANAEROBIC  2CC  Final   Culture NO GROWTH 2 DAYS  Final   Report Status PENDING  Incomplete    IMAGING: Dg Chest Port 1 View  04/19/2016  CLINICAL DATA:  Sepsis EXAM: PORTABLE CHEST 1 VIEW COMPARISON:  07/06/2015 FINDINGS: Cardiomediastinal silhouette is stable. No infiltrate or pulmonary edema. Again noted chronic eventration of the left posterior hemidiaphragm. Stable degenerative changes bilateral shoulders. Mild left basilar atelectasis. IMPRESSION: No active disease.  No significant change. Electronically Signed   By: Lahoma Crocker M.D.   On: 04/19/2016 15:39   06/2015 IMPRESSION: Persistent marked RIGHT hydronephrosis and hydroureter terminating at the very distal RIGHT ureter near the ureterovesical junction.  Distended urinary bladder with mild wall thickening.  Chronic atelectasis LEFT lower lobe, slightly improved since previous exam.  Assessment:   Stacey Torres is a 82 y.o. female with recurrent UTIs, Cerebral palsy, bedbound, incontient and urological hx of prior severe chronic hydornephrosis,  Now with second admission in the last month for UTI, this time with proteus, prior with ESBL E coli I suspect she may need a chronic foley, given her prior hx. Has not had renal imaging this admission.  Recommendations Can change abx to ancef while inpt then dc on keflex  Will consult urology as likely will have recurrent infections and may need chronic foley.  Thank you very much for allowing me to participate in the care of this patient. Please call with questions.   Cheral Marker. Ola Spurr, MD

## 2016-04-21 NOTE — Clinical Social Work Note (Signed)
Clinical Social Work Assessment  Patient Details  Name: Stacey Torres MRN: [id number] Date of Birth: July 18, 1929  Date of referral:  04/21/16               Reason for consult:  Facility Placement                Permission sought to share information with:    Permission granted to share information::     Name::        Agency::     Relationship::     Contact Information:     Housing/Transportation Living arrangements for the past 2 months:  [place] of Information:  Other (Comment Required) (niece: Marilynn [id number] [phone]) Patient Interpreter Needed:  None Criminal Activity/Legal Involvement Pertinent to Current Situation/Hospitalization:  No - Comment as needed Significant Relationships:  Other Family Members Lives with:  Facility Resident Do you feel safe going back to the place where you live?    Need for family participation in patient care:  Yes (Comment)  Care giving concerns:  None; patient resides at [facility] as a long term care resident.   Social Worker assessment / plan:  CSW contacted patient's niece: Ms. Hassell Done this morning via phone and she confirmed that patient has resided at [facility] for approximately 4 years. She stated when it is time the family does wish for her to return and also wishes for [facility] to do the transport if they are available. If not available, then she is ok with EMS. CSW confirmed with Crystal at [facility] tha they can take patient back at discharge.  Employment status:  Retired Nurse, adult PT Recommendations:  Not assessed at this time Information / Referral to community resources:     Patient/Family's Response to care:  Patient's niece expressed appreciation for CSW phone call.  Patient/Family's Understanding of and Emotional Response to Diagnosis, Current Treatment, and Prognosis:  Patient's niece stated that she received an update via patient's nurse yesterday and  intends to visit patient today and she will get an update again.  Emotional Assessment Appearance:  Appears stated age Attitude/Demeanor/Rapport:   (pleasantly confused) Affect (typically observed):  Calm Orientation:  Fluctuating Orientation (Suspected and/or reported Sundowners) Alcohol / Substance use:  Not Applicable Psych involvement (Current and /or in the community):  No (Comment)  Discharge Needs  Concerns to be addressed:  Care Coordination Readmission within the last 30 days:  No Current discharge risk:  None Barriers to Discharge:  No Barriers Identified   Shela Leff, LCSW 04/21/2016, 11:02 AM

## 2016-04-21 NOTE — Plan of Care (Signed)
Problem: Nutrition: Goal: Adequate nutrition will be maintained Outcome: Not Progressing Pt has a poor appetite.

## 2016-04-22 MED ORDER — CEPHALEXIN 500 MG PO CAPS
500.0000 mg | ORAL_CAPSULE | Freq: Three times a day (TID) | ORAL | Status: DC
Start: 2016-04-22 — End: 2016-05-09

## 2016-04-22 MED ORDER — POTASSIUM CHLORIDE CRYS ER 20 MEQ PO TBCR: 20.0000 meq | EXTENDED_RELEASE_TABLET | Freq: Every day | ORAL | Status: AC

## 2016-04-22 NOTE — Discharge Summary (Signed)
[facility] NAME: Stacey Torres    MR#:  [id number]  DATE OF BIRTH:  May 10, 1929  DATE OF ADMISSION:  04/19/2016 ADMITTING PHYSICIAN: Vaughan Basta, MD  DATE OF DISCHARGE: No discharge date for patient encounter.  PRIMARY CARE PHYSICIAN: Viviana Simpler, MD   ADMISSION DIAGNOSIS:  Sepsis secondary to UTI ([place]) [A41.9, N39.0]  DISCHARGE DIAGNOSIS:  Principal Problem:   ESBL (extended spectrum beta-lactamase) producing bacteria infection Active Problems:   Sepsis ([place])   Pressure ulcer   SECONDARY DIAGNOSIS:   Past Medical History  Diagnosis Date  . Cerebral palsy, hemiplegic ([place])     pt is intelligant  . Neurologic gait dysfunction   . Spinal stenosis   . Urge urinary incontinence   . Hypertension   . Arthritis   . Neurogenic bladder   . Hydronephrosis, right   . Chronic cystitis   . Gross hematuria   . RA (rheumatoid arthritis) (H[facility])     advanced  . Flexion contracture joint of multiple sites     secondary to advanced ra  . Slurred speech   . Cancer ([place]) skin ca  . Seasonal allergies   . UTI (lower urinary tract infection)   . Sepsis due to urinary tract infection ([place])   . Cough   . RA (rheumatoid arthritis) ([place])   . Edema, lower extremity   . Cataract   . History of ESBL E. coli infection      ADMITTING HISTORY  HISTORY OF PRESENT ILLNESS: Stacey Torres is a 82 y.o. female was admitted 2 weeks ago- with UTI, given 2 days of IV zosyn, and d/c on Cefuroxime- lives in NH and started feeling weak again. Found to have sepsis in ER with UTI. On following Cx from last UTI- it shows ESBL - resistant to ceftriaxone.  HOSPITAL COURSE:   Stacey Torres is a 82 y.o. female was admitted 2 weeks ago- with UTI, given 2 days of IV zosyn, and d/c on Cefuroxime- lives in NH and started feeling weak again. Found to have sepsis in ER with UTI. On following Cx from last UTI- it shows ESBL -  resistant to ceftriaxone.  * Sepsis due to UTI , Proteus and urine cultures.  Found ESBL in last Cx report. No ESBL on urine cultures this admission.  Initially on IV Zosyn on admission. Patient has his recurrent UTI secondary to neurogenic bladder. White count down from 24,000---->13,000 Seen by Dr. Ola Spurr and started on IV ancef in hospital. Recommended Keflex at discharge. We will treat for 5 more days after discharge. Dr. Ola Spurr discussed case with urology Dr. Erlene Quan who did not recommend any further investigations. Patient has mild chronic hydronephrosis which is stable. Does not need Foley as per urology.  * Hyponatremia due to dehydration.  Resolved  * Hypokalemia due to decreased oral intake. Resolved  * Weakness due to UTI and dehydration- improving Patient is bedbound at baseline and gets into wheelchair with assistance.  * Hypertension  Metoprolol  Stable for discharge back to nursing home.  Patient does have severe protein calorie malnutrition. If she continues to have low calorie intake she will need to see palliative care and have hospice services in the future.  CONSULTS OBTAINED:  Treatment Team:  Leonel Ramsay, MD Hollice Espy, MD  DRUG ALLERGIES:   Allergies  Allergen Reactions  . Aspirin Other (See Comments)    Gi bleeding  . Macrobid [Nitrofurantoin] Other (See Comments)  Reaction:  Unknown     DISCHARGE MEDICATIONS:   Current Discharge Medication List    START taking these medications   Details  cephALEXin (KEFLEX) 500 MG capsule Take 1 capsule (500 mg total) by mouth 3 (three) times daily. Qty: 15 capsule, Refills: 0    potassium chloride SA (K-DUR,KLOR-CON) 20 MEQ tablet Take 1 tablet (20 mEq total) by mouth daily.      CONTINUE these medications which have NOT CHANGED   Details  acetaminophen (TYLENOL) 650 MG CR tablet Take 650 mg by mouth 3 (three) times daily.     ALPRAZolam (XANAX) 0.25 MG tablet Take 1 tablet  (0.25 mg total) by mouth 2 (two) times daily as needed. Qty: 10 tablet, Refills: 0    Ascorbic Acid (VITAMIN C) 1000 MG tablet Take 1,000 mg by mouth daily.    DULoxetine (CYMBALTA) 60 MG capsule Take 60 mg by mouth daily.     feeding supplement, ENSURE ENLIVE, (ENSURE ENLIVE) LIQD Take 237 mLs by mouth 2 (two) times daily between meals. Qty: 237 mL, Refills: 12    fluticasone (FLONASE) 50 MCG/ACT nasal spray Place 2 sprays into both nostrils 2 (two) times daily.    folic acid (FOLVITE) 1 MG tablet Take 1 mg by mouth daily.    HYDROcodone-acetaminophen (NORCO/VICODIN) 5-325 MG per tablet Take 2 tablets by mouth 2 (two) times daily. Take at 4 pm and 8 pm.    hydroxychloroquine (PLAQUENIL) 200 MG tablet Take 200 mg by mouth 2 (two) times daily.    Hypromellose (ARTIFICIAL TEARS OP) Apply 2 drops to eye 2 (two) times daily as needed (for dry eyes).    loratadine (CLARITIN) 10 MG tablet Take 10 mg by mouth daily.     metoprolol succinate (TOPROL-XL) 50 MG 24 hr tablet Take 50 mg by mouth every morning. *Hold if B/P is < than 110/60.)    mirtazapine (REMERON) 30 MG tablet Take 30 mg by mouth at bedtime.    montelukast (SINGULAIR) 10 MG tablet Take 10 mg by mouth every evening.    Multiple Vitamin (THEREMS) TABS Take 1 tablet by mouth daily.    omeprazole (PRILOSEC) 20 MG capsule Take 1 capsule (20 mg total) by mouth daily. Qty: 60 capsule, Refills: 0    ondansetron (ZOFRAN) 4 MG tablet Take 4 mg by mouth every 4 (four) hours as needed for nausea or vomiting.     polyethylene glycol (MIRALAX / GLYCOLAX) packet Take 17 g by mouth daily as needed for mild constipation or moderate constipation.     primidone (MYSOLINE) 50 MG tablet Take 50 mg by mouth 2 (two) times daily.     sennosides-docusate sodium (SENOKOT-S) 8.6-50 MG tablet Take 1 tablet by mouth at bedtime.    traZODone (DESYREL) 50 MG tablet Take 50 mg by mouth at bedtime.      STOP taking these medications      cefUROXime (CEFTIN) 250 MG tablet         Today   VITAL SIGNS:  Blood pressure 129/63, pulse 88, temperature 98.2 F (36.8 C), temperature source Oral, resp. rate 24, height 5\' 5"  (1.651 m), weight 49.896 kg (110 lb), SpO2 100 %.  I/O:   Intake/Output Summary (Last 24 hours) at 04/22/16 1125 Last data filed at 04/22/16 0757  Gross per 24 hour  Intake 1896.1 ml  Output      0 ml  Net 1896.1 ml    PHYSICAL EXAMINATION:  Physical Exam  GENERAL:  82 y.o.-year-old patient lying  in the bed with no acute distress.  LUNGS: Normal breath sounds bilaterally, no wheezing, rales,rhonchi or crepitation. No use of accessory muscles of respiration.  CARDIOVASCULAR: S1, S2 normal. No murmurs, rubs, or gallops.  ABDOMEN: Soft, non-tender, non-distended. Bowel sounds present. No organomegaly or mass.  NEUROLOGIC: contractures and muscle wasting PSYCHIATRIC: The patient is alert and awake SKIN: Pressure ulcer  DATA REVIEW:   CBC  Recent Labs Lab 04/20/16 0733  WBC 13.7*  HGB 9.3*  HCT 27.9*  PLT 363    Chemistries   Recent Labs Lab 04/19/16 1546 04/20/16 0621  NA 125* 133*  K 3.1* 3.8  CL 86* 99*  CO2 23 22  GLUCOSE 171* 100*  BUN 34* 19  CREATININE 0.51 0.40*  CALCIUM 8.6* 8.5*  AST 13*  --   ALT 8*  --   ALKPHOS 94  --   BILITOT 0.3  --     Cardiac Enzymes  Recent Labs Lab 04/19/16 1546  TROPONINI 0.03    Microbiology Results  Results for orders placed or performed during the hospital encounter of 04/19/16  Blood Culture (routine x 2)     Status: None (Preliminary result)   Collection Time: 04/19/16  3:04 PM  Result Value Ref Range Status   Specimen Description BLOOD LEFT WRIST  Final   Special Requests BOTTLES DRAWN AEROBIC AND ANAEROBIC 1CC  Final   Culture NO GROWTH 3 DAYS  Final   Report Status PENDING  Incomplete  Urine culture     Status: Abnormal   Collection Time: 04/19/16  3:04 PM  Result Value Ref Range Status   Specimen Description URINE,  RANDOM  Final   Special Requests NONE  Final   Culture >=100,000 COLONIES/mL PROTEUS MIRABILIS (A)  Final   Report Status 04/21/2016 FINAL  Final   Organism ID, Bacteria PROTEUS MIRABILIS (A)  Final      Susceptibility   Proteus mirabilis - MIC*    AMPICILLIN >=32 RESISTANT Resistant     CEFAZOLIN <=4 SENSITIVE Sensitive     CEFTRIAXONE <=1 SENSITIVE Sensitive     CIPROFLOXACIN >=4 RESISTANT Resistant     GENTAMICIN <=1 SENSITIVE Sensitive     IMIPENEM 2 SENSITIVE Sensitive     NITROFURANTOIN 128 RESISTANT Resistant     TRIMETH/SULFA >=320 RESISTANT Resistant     AMPICILLIN/SULBACTAM 4 SENSITIVE Sensitive     PIP/TAZO <=4 SENSITIVE Sensitive     * >=100,000 COLONIES/mL PROTEUS MIRABILIS  Blood Culture (routine x 2)     Status: None (Preliminary result)   Collection Time: 04/19/16  3:46 PM  Result Value Ref Range Status   Specimen Description BLOOD RIGHT ANTECUBITAL  Final   Special Requests BOTTLES DRAWN AEROBIC AND ANAEROBIC  2CC  Final   Culture NO GROWTH 3 DAYS  Final   Report Status PENDING  Incomplete    RADIOLOGY:  No results found.  Follow up with PCP in 1 week.  Management plans discussed with the patient, family and they are in agreement.  CODE STATUS:     Code Status Orders        Start     Ordered   04/19/16 1834  Do not attempt resuscitation (DNR)   Continuous    Question Answer Comment  In the event of cardiac or respiratory ARREST Do not call a "code blue"   In the event of cardiac or respiratory ARREST Do not perform Intubation, CPR, defibrillation or ACLS   In the event of cardiac or respiratory ARREST  Use medication by any route, position, wound care, and other measures to relive pain and suffering. May use oxygen, suction and manual treatment of airway obstruction as needed for comfort.      04/19/16 1834    Code Status History    Date Active Date Inactive Code Status Order ID Comments User Context   03/30/2016 11:59 AM 04/01/2016  8:59 PM DNR  [id number]  Hillary Bow, MD ED   07/06/2015 10:04 PM 07/08/2015  7:05 PM DNR [id number]  Lytle Butte, MD ED   04/01/2015 10:08 PM 04/05/2015  5:54 PM DNR [id number]  Lytle Butte, MD ED    Advance Directive Documentation        Most Recent Value   Type of Advance Directive  Out of facility DNR (pink MOST or yellow form)   Pre-existing out of facility DNR order (yellow form or pink MOST form)  Physician notified to receive inpatient order, Yellow form placed in chart (order not valid for inpatient use)   "MOST" Form in Place?        TOTAL TIME TAKING CARE OF THIS PATIENT ON DAY OF DISCHARGE: more than 30 minutes.   Hillary Bow R M.D on 04/22/2016 at 11:25 AM  Between 7am to 6pm - Pager - [phone]  After 6pm go to [email or web address] - password [email or web address] [facility]  Office  [phone]  CC: Primary care physician; Viviana Simpler, MD  Note: This dictation was prepared with Dragon dictation along with smaller phrase technology. Any transcriptional errors that result from this process are unintentional.

## 2016-04-22 NOTE — Care Management Important Message (Signed)
Important Message  Patient Details  Name: Stacey Torres MRN: [id number] Date of Birth: 01-22-1929   Medicare Important Message Given:  Yes    Beverly Sessions, RN 04/22/2016, 12:14 PM

## 2016-04-22 NOTE — Discharge Instructions (Signed)
°  DIET:  Resume as before  DISCHARGE CONDITION:  Stable  ACTIVITY:  Activity as tolerated  OXYGEN:  Home Oxygen: No.   Oxygen Delivery: room air  DISCHARGE LOCATION:  nursing home   If you experience worsening of your admission symptoms, develop shortness of breath, life threatening emergency, suicidal or homicidal thoughts you must seek medical attention immediately by calling 911 or calling your MD immediately  if symptoms less severe.  You Must read complete instructions/literature along with all the possible adverse reactions/side effects for all the Medicines you take and that have been prescribed to you. Take any new Medicines after you have completely understood and accpet all the possible adverse reactions/side effects.   Please note  You were cared for by a hospitalist during your hospital stay. If you have any questions about your discharge medications or the care you received while you were in the hospital after you are discharged, you can call the unit and asked to speak with the hospitalist on call if the hospitalist that took care of you is not available. Once you are discharged, your primary care physician will handle any further medical issues. Please note that NO REFILLS for any discharge medications will be authorized once you are discharged, as it is imperative that you return to your primary care physician (or establish a relationship with a primary care physician if you do not have one) for your aftercare needs so that they can reassess your need for medications and monitor your lab values.

## 2016-04-24 DIAGNOSIS — R339 Retention of urine, unspecified: Secondary | ICD-10-CM | POA: Diagnosis not present

## 2016-04-24 DIAGNOSIS — E440 Moderate protein-calorie malnutrition: Secondary | ICD-10-CM | POA: Diagnosis not present

## 2016-04-24 DIAGNOSIS — R6520 Severe sepsis without septic shock: Secondary | ICD-10-CM | POA: Diagnosis not present

## 2016-04-24 DIAGNOSIS — B964 Proteus (mirabilis) (morganii) as the cause of diseases classified elsewhere: Secondary | ICD-10-CM | POA: Diagnosis not present

## 2016-04-24 LAB — CULTURE, BLOOD (ROUTINE X 2)
CULTURE: NO GROWTH
CULTURE: NO GROWTH

## 2016-05-01 IMAGING — CT CT ABD-PELV W/ CM
1 of 3 series · 14 of 32 positions shown, 19 images · IV contrast (omnipaque)
Comparison: 04/03/2015

CLINICAL DATA: Vomiting for 2-3 days, leukocytosis, recurrent UTI,
history cerebral palsy, hypertension, neurogenic bladder, chronic
cystitis

EXAM:
CT ABDOMEN AND PELVIS WITH CONTRAST
TECHNIQUE: Multidetector CT imaging of the abdomen and pelvis was performed
using the standard protocol following bolus administration of
intravenous contrast.
CONTRAST:  80mL OMNIPAQUE IOHEXOL 300 MG/ML SOLN IV. No oral
contrast administered.

[Series 2: routine abd pel with · axial · 0.67mm/px · z∈[-564,-139]mm · 14 of 97 slices shown, 19 images]
[im 6/97  soft-tissue]
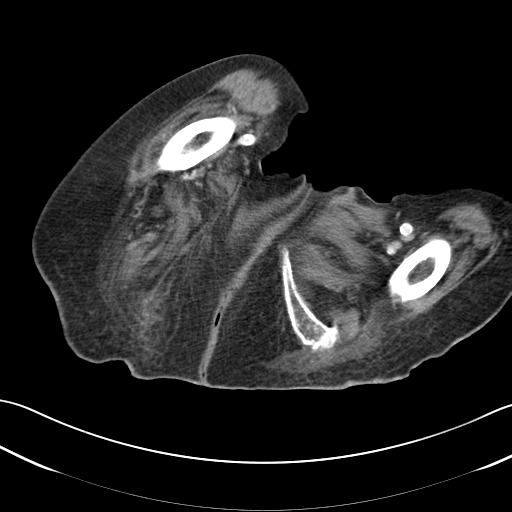
[im 6/97  bone]
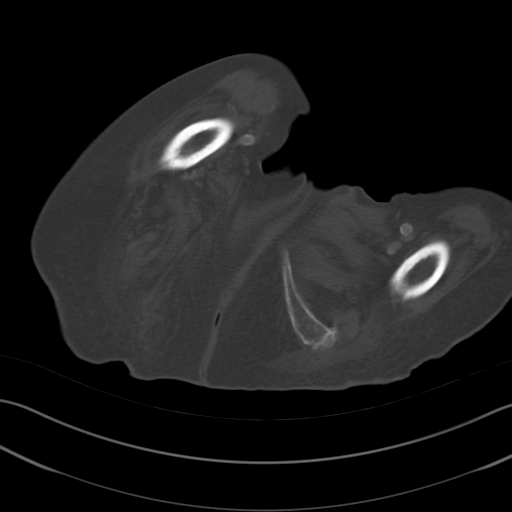
[im 16/97  soft-tissue]
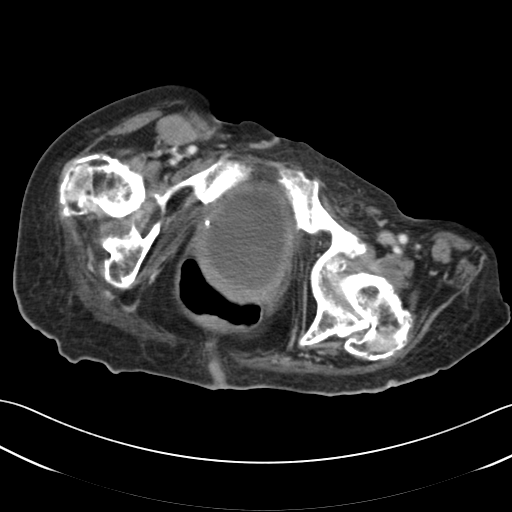
[im 21/97  soft-tissue]
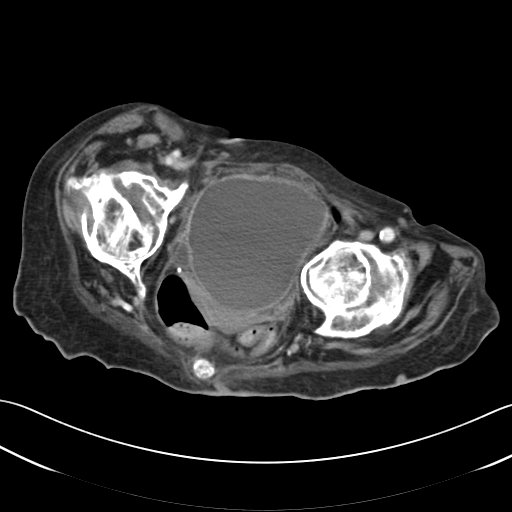
[im 26/97  soft-tissue]
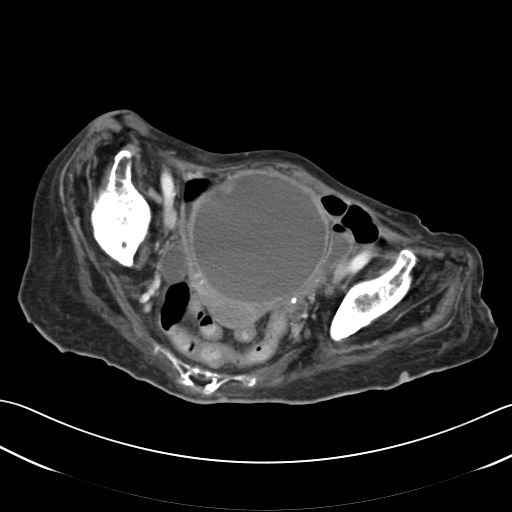
[im 36/97  soft-tissue]
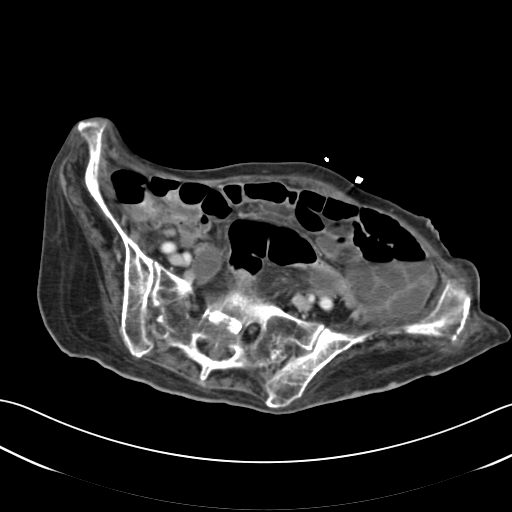
[im 41/97  soft-tissue]
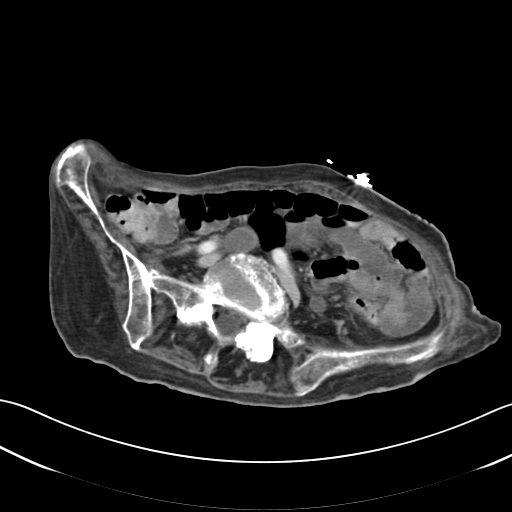
[im 51/97  soft-tissue]
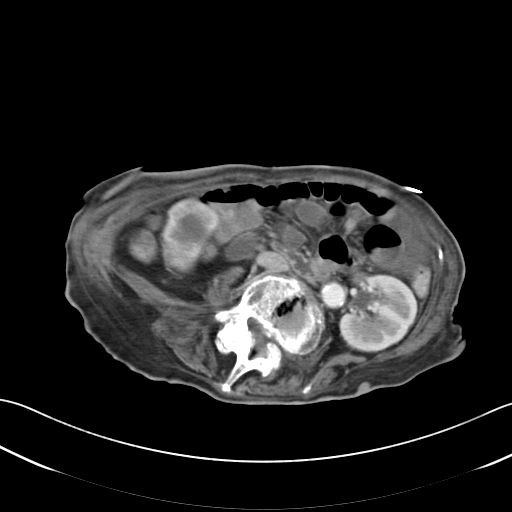
[im 56/97  soft-tissue]
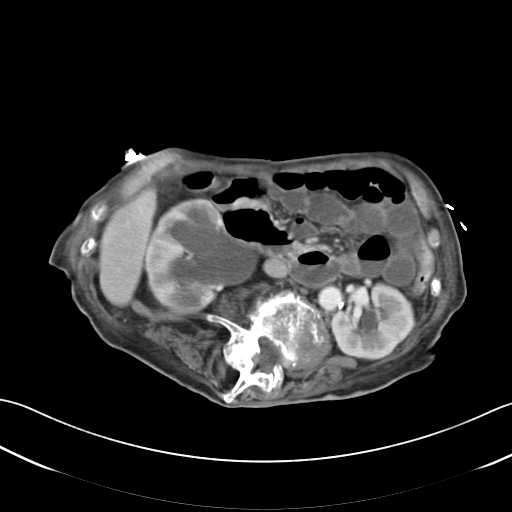
[im 61/97  soft-tissue]
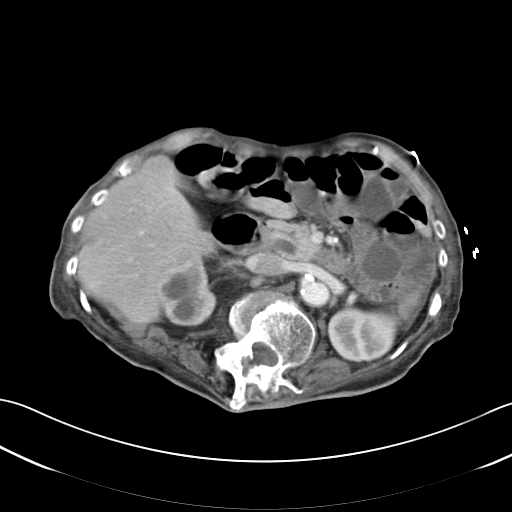
[im 61/97  bone]
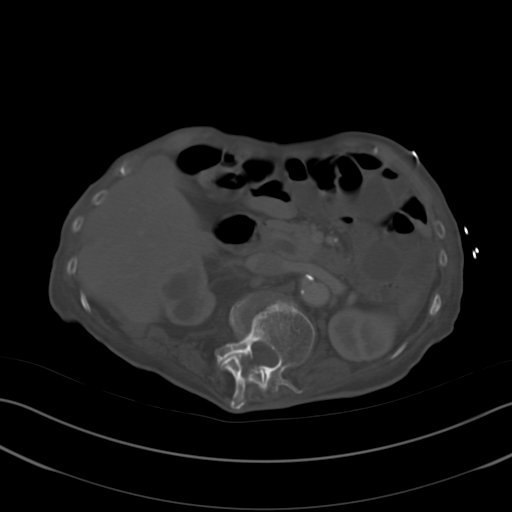
[im 71/97  soft-tissue]
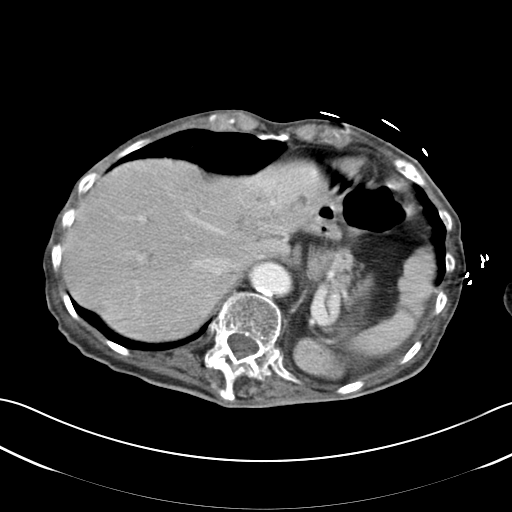
[im 76/97  soft-tissue]
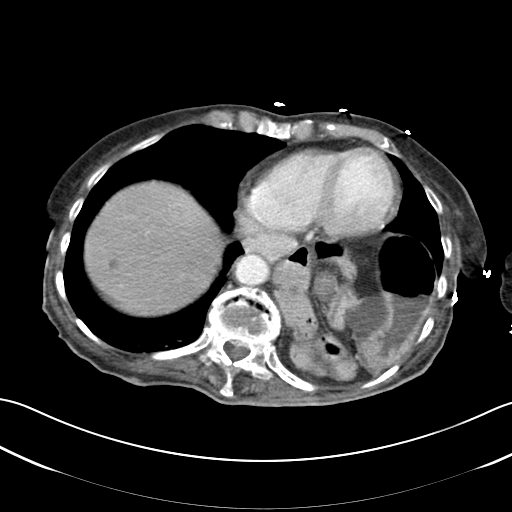
[im 76/97  lung]
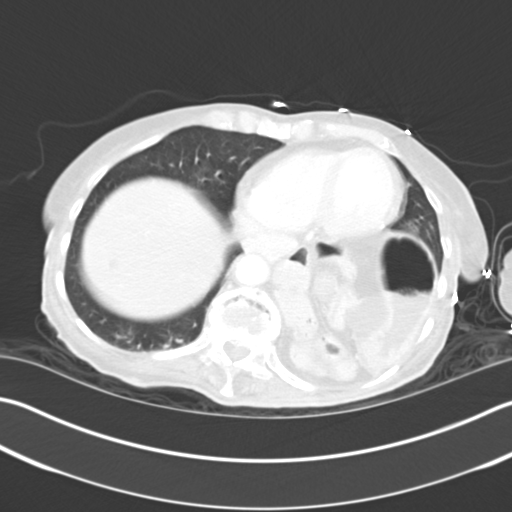
[im 81/97  soft-tissue]
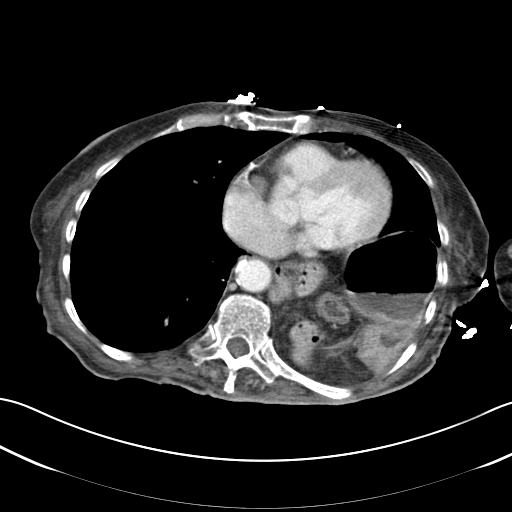
[im 81/97  lung]
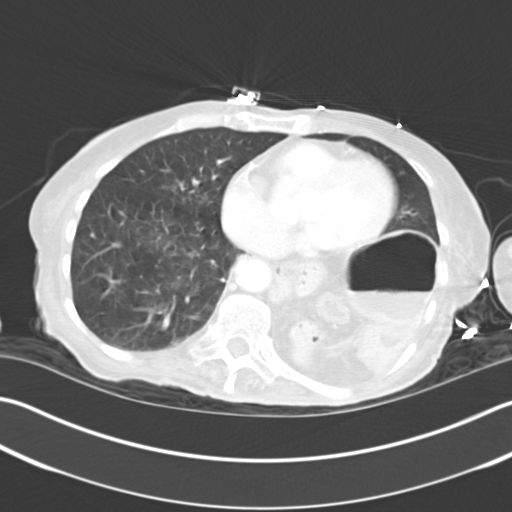
[im 86/97  lung]
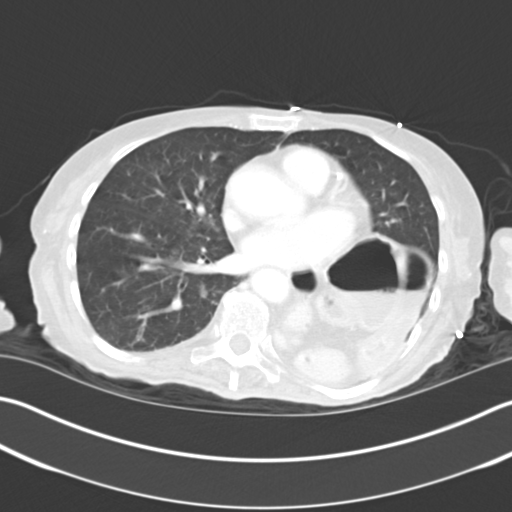
[im 91/97  soft-tissue]
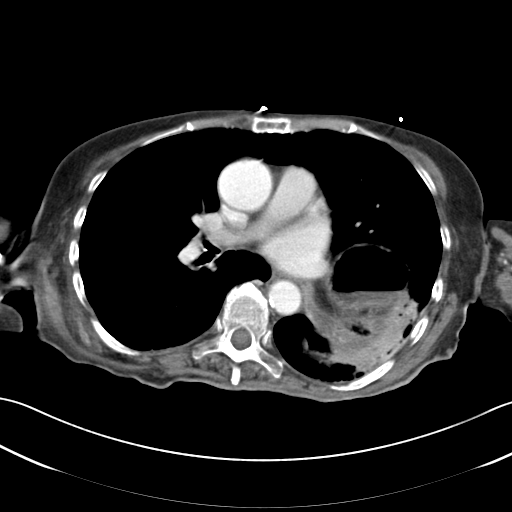
[im 91/97  lung]
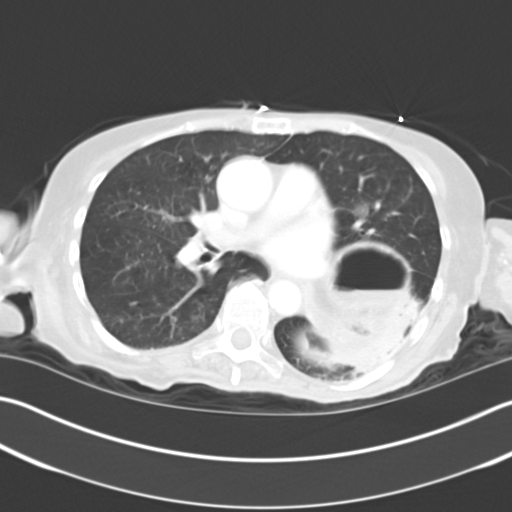

[14 of 32 positions shown; findings below may reference images not displayed]

FINDINGS: Eventration of LEFT diaphragm with decreased LEFT basilar
atelectasis.

3 mm RIGHT lower lobe nodule image 11, new.

Persistent marked RIGHT hydronephrosis and hydroureter with RIGHT
ureteral dilatation extending to very near the ureterovesical
junction.

Mild cortical thinning RIGHT kidney.

Tiny nonobstructing calculus inferior pole LEFT kidney without LEFT
hydronephrosis or mass.

Scattered atherosclerotic calcifications.

Tiny hepatic cysts.

Liver, spleen, pancreas, kidneys, and adrenal glands otherwise
unremarkable.

Mild bladder wall thickening with bladder distention.

Atrophic uterus and adnexa.

Stomach and bowel loops grossly unremarkable for exam lacking GI
contrast.

No mass, adenopathy, free air or free fluid.

Marked osseous demineralization with degenerative changes of both
hip joints.

Scoliosis and degenerative changes of the lumbar spine with
compression deformities at superior L5, L1, and T11 grossly
unchanged.
IMPRESSION: Persistent marked RIGHT hydronephrosis and hydroureter terminating
at the very distal RIGHT ureter near the ureterovesical junction.

Distended urinary bladder with mild wall thickening.

Chronic atelectasis LEFT lower lobe, slightly improved since
previous exam.

## 2016-05-06 ENCOUNTER — Emergency Department: Payer: Medicare Other

## 2016-05-06 ENCOUNTER — Encounter: Payer: Self-pay | Admitting: *Deleted

## 2016-05-06 ENCOUNTER — Inpatient Hospital Stay
Admission: EM | Admit: 2016-05-06 | Discharge: 2016-05-10 | DRG: 872 | Disposition: A | Discharge status: Skilled Nursing Facility | Payer: Medicare Other | Attending: Internal Medicine | Admitting: Internal Medicine

## 2016-05-06 DIAGNOSIS — A4189 Other specified sepsis: Secondary | ICD-10-CM | POA: Diagnosis not present

## 2016-05-06 DIAGNOSIS — R627 Adult failure to thrive: Secondary | ICD-10-CM | POA: Diagnosis present

## 2016-05-06 DIAGNOSIS — A419 Sepsis, unspecified organism: Secondary | ICD-10-CM

## 2016-05-06 DIAGNOSIS — R112 Nausea with vomiting, unspecified: Secondary | ICD-10-CM

## 2016-05-06 DIAGNOSIS — M4800 Spinal stenosis, site unspecified: Secondary | ICD-10-CM | POA: Diagnosis present

## 2016-05-06 DIAGNOSIS — Z792 Long term (current) use of antibiotics: Secondary | ICD-10-CM

## 2016-05-06 DIAGNOSIS — I10 Essential (primary) hypertension: Secondary | ICD-10-CM | POA: Diagnosis present

## 2016-05-06 DIAGNOSIS — Z803 Family history of malignant neoplasm of breast: Secondary | ICD-10-CM

## 2016-05-06 DIAGNOSIS — L89899 Pressure ulcer of other site, unspecified stage: Secondary | ICD-10-CM | POA: Diagnosis present

## 2016-05-06 DIAGNOSIS — Z9049 Acquired absence of other specified parts of digestive tract: Secondary | ICD-10-CM

## 2016-05-06 DIAGNOSIS — E871 Hypo-osmolality and hyponatremia: Secondary | ICD-10-CM | POA: Diagnosis present

## 2016-05-06 DIAGNOSIS — R000 Tachycardia, unspecified: Secondary | ICD-10-CM | POA: Diagnosis present

## 2016-05-06 DIAGNOSIS — Z66 Do not resuscitate: Secondary | ICD-10-CM | POA: Diagnosis present

## 2016-05-06 DIAGNOSIS — N390 Urinary tract infection, site not specified: Secondary | ICD-10-CM | POA: Diagnosis present

## 2016-05-06 DIAGNOSIS — Z515 Encounter for palliative care: Secondary | ICD-10-CM | POA: Diagnosis present

## 2016-05-06 DIAGNOSIS — Z882 Allergy status to sulfonamides status: Secondary | ICD-10-CM

## 2016-05-06 DIAGNOSIS — Z883 Allergy status to other anti-infective agents status: Secondary | ICD-10-CM

## 2016-05-06 DIAGNOSIS — D638 Anemia in other chronic diseases classified elsewhere: Secondary | ICD-10-CM | POA: Diagnosis present

## 2016-05-06 DIAGNOSIS — N319 Neuromuscular dysfunction of bladder, unspecified: Secondary | ICD-10-CM | POA: Diagnosis present

## 2016-05-06 DIAGNOSIS — G809 Cerebral palsy, unspecified: Secondary | ICD-10-CM | POA: Diagnosis present

## 2016-05-06 DIAGNOSIS — Z79899 Other long term (current) drug therapy: Secondary | ICD-10-CM

## 2016-05-06 DIAGNOSIS — Z8249 Family history of ischemic heart disease and other diseases of the circulatory system: Secondary | ICD-10-CM

## 2016-05-06 DIAGNOSIS — Z87891 Personal history of nicotine dependence: Secondary | ICD-10-CM

## 2016-05-06 DIAGNOSIS — E875 Hyperkalemia: Secondary | ICD-10-CM | POA: Diagnosis present

## 2016-05-06 DIAGNOSIS — E860 Dehydration: Secondary | ICD-10-CM

## 2016-05-06 DIAGNOSIS — E876 Hypokalemia: Secondary | ICD-10-CM | POA: Diagnosis present

## 2016-05-06 DIAGNOSIS — Z886 Allergy status to analgesic agent status: Secondary | ICD-10-CM

## 2016-05-06 DIAGNOSIS — Z96651 Presence of right artificial knee joint: Secondary | ICD-10-CM | POA: Diagnosis present

## 2016-05-06 DIAGNOSIS — M069 Rheumatoid arthritis, unspecified: Secondary | ICD-10-CM | POA: Diagnosis present

## 2016-05-06 LAB — COMPREHENSIVE METABOLIC PANEL
ALK PHOS: 87 U/L (ref 38–126)
ALT: 6 U/L — AB (ref 14–54)
AST: 14 U/L — ABNORMAL LOW (ref 15–41)
Albumin: 2.6 g/dL — ABNORMAL LOW (ref 3.5–5.0)
Anion gap: 11 (ref 5–15)
BILIRUBIN TOTAL: 0.1 mg/dL — AB (ref 0.3–1.2)
BUN: 65 mg/dL — AB (ref 6–20)
CALCIUM: 8.9 mg/dL (ref 8.9–10.3)
CO2: 17 mmol/L — ABNORMAL LOW (ref 22–32)
CREATININE: 0.75 mg/dL (ref 0.44–1.00)
Chloride: 100 mmol/L — ABNORMAL LOW (ref 101–111)
GFR calc non Af Amer: >60 mL/min (ref >60)
Glucose, Bld: 104 mg/dL — ABNORMAL HIGH (ref 65–99)
Potassium: 6.1 mmol/L — ABNORMAL HIGH (ref 3.5–5.1)
SODIUM: 128 mmol/L — AB (ref 135–145)
TOTAL PROTEIN: 6.7 g/dL (ref 6.5–8.1)

## 2016-05-06 LAB — CBC
HCT: 29 % — ABNORMAL LOW (ref 35.0–47.0)
Hemoglobin: 9.9 g/dL — ABNORMAL LOW (ref 12.0–16.0)
MCH: 34.5 pg — ABNORMAL HIGH (ref 26.0–34.0)
MCHC: 34 g/dL (ref 32.0–36.0)
MCV: 101.5 fL — ABNORMAL HIGH (ref 80.0–100.0)
PLATELETS: 333 10*3/uL (ref 150–440)
RBC: 2.85 MIL/uL — ABNORMAL LOW (ref 3.80–5.20)
RDW: 17.2 % — AB (ref 11.5–14.5)
WBC: 19.1 10*3/uL — AB (ref 3.6–11.0)

## 2016-05-06 MED ORDER — SODIUM CHLORIDE 0.9 % IV BOLUS (SEPSIS)
1000.0000 mL | Freq: Once | INTRAVENOUS | Status: AC
  Administered 2016-05-06: 1000 mL via INTRAVENOUS

## 2016-05-06 MED ORDER — SODIUM CHLORIDE 0.9 % IV BOLUS (SEPSIS)
500.0000 mL | Freq: Once | INTRAVENOUS | Status: AC
  Administered 2016-05-06: 500 mL via INTRAVENOUS

## 2016-05-06 NOTE — ED Notes (Addendum)
Still unable to obtain IV access at this time, charge Animator at bedside. MD Marcelene Butte made aware at this time. Will start fluids once IV access established.

## 2016-05-06 NOTE — ED Notes (Signed)
This RN attempted x 2 times for IV, another RN attempted x 1. Will continue to try for IV access.

## 2016-05-06 NOTE — ED Notes (Signed)
IV placed, patent and tolerating fluids but unable to draw blood for labs from IV. Looking for site to straight stick at this time.

## 2016-05-06 NOTE — ED Notes (Signed)
Pt to ED from [facility] with vomiting x 3 days, per EMS niece stated today was coffee ground emesis sent to ED for further evaluation for GI bleed.. Pt denies any other associated s/s. Vitals stable, NAD note at this time.

## 2016-05-07 DIAGNOSIS — M4800 Spinal stenosis, site unspecified: Secondary | ICD-10-CM | POA: Diagnosis present

## 2016-05-07 DIAGNOSIS — N390 Urinary tract infection, site not specified: Secondary | ICD-10-CM | POA: Diagnosis present

## 2016-05-07 DIAGNOSIS — Z886 Allergy status to analgesic agent status: Secondary | ICD-10-CM | POA: Diagnosis not present

## 2016-05-07 DIAGNOSIS — Z66 Do not resuscitate: Secondary | ICD-10-CM | POA: Diagnosis present

## 2016-05-07 DIAGNOSIS — R627 Adult failure to thrive: Secondary | ICD-10-CM | POA: Diagnosis present

## 2016-05-07 DIAGNOSIS — Z79899 Other long term (current) drug therapy: Secondary | ICD-10-CM | POA: Diagnosis not present

## 2016-05-07 DIAGNOSIS — E875 Hyperkalemia: Secondary | ICD-10-CM | POA: Diagnosis present

## 2016-05-07 DIAGNOSIS — M069 Rheumatoid arthritis, unspecified: Secondary | ICD-10-CM | POA: Diagnosis present

## 2016-05-07 DIAGNOSIS — A4189 Other specified sepsis: Secondary | ICD-10-CM | POA: Diagnosis present

## 2016-05-07 DIAGNOSIS — Z9049 Acquired absence of other specified parts of digestive tract: Secondary | ICD-10-CM | POA: Diagnosis not present

## 2016-05-07 DIAGNOSIS — G809 Cerebral palsy, unspecified: Secondary | ICD-10-CM | POA: Diagnosis present

## 2016-05-07 DIAGNOSIS — A419 Sepsis, unspecified organism: Secondary | ICD-10-CM | POA: Diagnosis present

## 2016-05-07 DIAGNOSIS — D638 Anemia in other chronic diseases classified elsewhere: Secondary | ICD-10-CM | POA: Diagnosis present

## 2016-05-07 DIAGNOSIS — Z96651 Presence of right artificial knee joint: Secondary | ICD-10-CM | POA: Diagnosis present

## 2016-05-07 DIAGNOSIS — E876 Hypokalemia: Secondary | ICD-10-CM | POA: Diagnosis present

## 2016-05-07 DIAGNOSIS — Z515 Encounter for palliative care: Secondary | ICD-10-CM | POA: Diagnosis present

## 2016-05-07 DIAGNOSIS — Z87891 Personal history of nicotine dependence: Secondary | ICD-10-CM | POA: Diagnosis not present

## 2016-05-07 DIAGNOSIS — Z883 Allergy status to other anti-infective agents status: Secondary | ICD-10-CM | POA: Diagnosis not present

## 2016-05-07 DIAGNOSIS — E860 Dehydration: Secondary | ICD-10-CM | POA: Diagnosis present

## 2016-05-07 DIAGNOSIS — I10 Essential (primary) hypertension: Secondary | ICD-10-CM | POA: Diagnosis present

## 2016-05-07 DIAGNOSIS — L89899 Pressure ulcer of other site, unspecified stage: Secondary | ICD-10-CM | POA: Diagnosis present

## 2016-05-07 DIAGNOSIS — Z8249 Family history of ischemic heart disease and other diseases of the circulatory system: Secondary | ICD-10-CM | POA: Diagnosis not present

## 2016-05-07 DIAGNOSIS — R000 Tachycardia, unspecified: Secondary | ICD-10-CM | POA: Diagnosis present

## 2016-05-07 DIAGNOSIS — Z792 Long term (current) use of antibiotics: Secondary | ICD-10-CM | POA: Diagnosis not present

## 2016-05-07 DIAGNOSIS — N319 Neuromuscular dysfunction of bladder, unspecified: Secondary | ICD-10-CM | POA: Diagnosis present

## 2016-05-07 DIAGNOSIS — Z803 Family history of malignant neoplasm of breast: Secondary | ICD-10-CM | POA: Diagnosis not present

## 2016-05-07 DIAGNOSIS — Z882 Allergy status to sulfonamides status: Secondary | ICD-10-CM | POA: Diagnosis not present

## 2016-05-07 DIAGNOSIS — E871 Hypo-osmolality and hyponatremia: Secondary | ICD-10-CM | POA: Diagnosis present

## 2016-05-07 LAB — COMPREHENSIVE METABOLIC PANEL
ALK PHOS: 72 U/L (ref 38–126)
ALT: 6 U/L — AB (ref 14–54)
ANION GAP: 9 (ref 5–15)
AST: 17 U/L (ref 15–41)
Albumin: 2.2 g/dL — ABNORMAL LOW (ref 3.5–5.0)
BILIRUBIN TOTAL: 0.5 mg/dL (ref 0.3–1.2)
BUN: 62 mg/dL — ABNORMAL HIGH (ref 6–20)
CALCIUM: 8.8 mg/dL — AB (ref 8.9–10.3)
CO2: 19 mmol/L — ABNORMAL LOW (ref 22–32)
CREATININE: 0.69 mg/dL (ref 0.44–1.00)
Chloride: 101 mmol/L (ref 101–111)
GFR calc non Af Amer: >60 mL/min (ref >60)
Glucose, Bld: 126 mg/dL — ABNORMAL HIGH (ref 65–99)
Potassium: 5.1 mmol/L (ref 3.5–5.1)
Sodium: 129 mmol/L — ABNORMAL LOW (ref 135–145)
TOTAL PROTEIN: 5.9 g/dL — AB (ref 6.5–8.1)

## 2016-05-07 LAB — PROCALCITONIN: PROCALCITONIN: 2.53 ng/mL

## 2016-05-07 LAB — URINALYSIS COMPLETE WITH MICROSCOPIC (ARMC ONLY)
BILIRUBIN URINE: NEGATIVE
GLUCOSE, UA: NEGATIVE mg/dL
Nitrite: NEGATIVE
PH: 8 (ref 5.0–8.0)
Protein, ur: 100 mg/dL — AB
Specific Gravity, Urine: 1.015 (ref 1.005–1.030)

## 2016-05-07 LAB — CBC WITH DIFFERENTIAL/PLATELET
Basophils Absolute: 0 10*3/uL (ref 0–0.1)
Basophils Relative: 0 %
Eosinophils Absolute: 0 10*3/uL (ref 0–0.7)
Eosinophils Relative: 0 %
HEMATOCRIT: 23.5 % — AB (ref 35.0–47.0)
HEMOGLOBIN: 8.1 g/dL — AB (ref 12.0–16.0)
LYMPHS ABS: 1.1 10*3/uL (ref 1.0–3.6)
MCH: 33.8 pg (ref 26.0–34.0)
MCHC: 34.3 g/dL (ref 32.0–36.0)
MCV: 98.3 fL (ref 80.0–100.0)
Monocytes Absolute: 1.2 10*3/uL — ABNORMAL HIGH (ref 0.2–0.9)
NEUTROS ABS: 16.7 10*3/uL — AB (ref 1.4–6.5)
Platelets: 305 10*3/uL (ref 150–440)
RBC: 2.39 MIL/uL — ABNORMAL LOW (ref 3.80–5.20)
RDW: 17.6 % — ABNORMAL HIGH (ref 11.5–14.5)
WBC: 19.1 10*3/uL — ABNORMAL HIGH (ref 3.6–11.0)

## 2016-05-07 LAB — LACTIC ACID, PLASMA
LACTIC ACID, VENOUS: 1.2 mmol/L (ref 0.5–1.9)
Lactic Acid, Venous: 0.9 mmol/L (ref 0.5–1.9)
Lactic Acid, Venous: 1.4 mmol/L (ref 0.5–1.9)

## 2016-05-07 LAB — PROTIME-INR
INR: 1.49
Prothrombin Time: 18.1 seconds — ABNORMAL HIGH (ref 11.4–15.0)

## 2016-05-07 LAB — MRSA PCR SCREENING: MRSA by PCR: NEGATIVE

## 2016-05-07 LAB — APTT: aPTT: 41 seconds — ABNORMAL HIGH (ref 24–36)

## 2016-05-07 MED ORDER — ALPRAZOLAM 0.25 MG PO TABS
0.2500 mg | ORAL_TABLET | Freq: Two times a day (BID) | ORAL | Status: DC | PRN
  Administered 2016-05-07: 0.25 mg via ORAL
  Filled 2016-05-07: qty 1

## 2016-05-07 MED ORDER — PIPERACILLIN-TAZOBACTAM 4.5 G IVPB
4.5000 g | Freq: Three times a day (TID) | INTRAVENOUS | Status: DC
  Filled 2016-05-07 (×2): qty 100

## 2016-05-07 MED ORDER — DULOXETINE HCL 30 MG PO CPEP
60.0000 mg | ORAL_CAPSULE | Freq: Every day | ORAL | Status: DC
  Administered 2016-05-07 – 2016-05-10 (×3): 60 mg via ORAL
  Filled 2016-05-07 (×3): qty 2

## 2016-05-07 MED ORDER — PIPERACILLIN-TAZOBACTAM 3.375 G IVPB
INTRAVENOUS | Status: AC
  Administered 2016-05-07: 3.375 g via INTRAVENOUS
  Filled 2016-05-07: qty 50

## 2016-05-07 MED ORDER — PROMETHAZINE HCL 25 MG RE SUPP
12.5000 mg | Freq: Four times a day (QID) | RECTAL | Status: DC | PRN
  Filled 2016-05-07: qty 1

## 2016-05-07 MED ORDER — ONDANSETRON HCL 4 MG/2ML IJ SOLN
INTRAMUSCULAR | Status: AC
  Administered 2016-05-07: 4 mg via INTRAVENOUS
  Filled 2016-05-07: qty 2

## 2016-05-07 MED ORDER — ARTIFICIAL TEARS OP OINT
TOPICAL_OINTMENT | Freq: Three times a day (TID) | OPHTHALMIC | Status: DC
  Administered 2016-05-07 – 2016-05-10 (×10): via OPHTHALMIC
  Filled 2016-05-07: qty 3.5

## 2016-05-07 MED ORDER — HYDROCODONE-ACETAMINOPHEN 5-325 MG PO TABS
2.0000 | ORAL_TABLET | Freq: Two times a day (BID) | ORAL | Status: DC
  Administered 2016-05-07 – 2016-05-10 (×6): 2 via ORAL
  Filled 2016-05-07 (×6): qty 2

## 2016-05-07 MED ORDER — VANCOMYCIN HCL IN DEXTROSE 1-5 GM/200ML-% IV SOLN
1000.0000 mg | Freq: Once | INTRAVENOUS | Status: AC
  Administered 2016-05-07: 1000 mg via INTRAVENOUS

## 2016-05-07 MED ORDER — ACETAMINOPHEN 650 MG RE SUPP: 650.0000 mg | Freq: Four times a day (QID) | RECTAL | Status: DC | PRN

## 2016-05-07 MED ORDER — TRAZODONE HCL 50 MG PO TABS
50.0000 mg | ORAL_TABLET | Freq: Every day | ORAL | Status: DC
  Administered 2016-05-07: 50 mg via ORAL
  Filled 2016-05-07: qty 1

## 2016-05-07 MED ORDER — DEXTROSE 5 % IV SOLN
1.0000 g | Freq: Once | INTRAVENOUS | Status: AC
  Administered 2016-05-07: 1 g via INTRAVENOUS
  Filled 2016-05-07: qty 10

## 2016-05-07 MED ORDER — ENSURE ENLIVE PO LIQD
237.0000 mL | Freq: Three times a day (TID) | ORAL | Status: DC
  Administered 2016-05-07 – 2016-05-09 (×5): 237 mL via ORAL

## 2016-05-07 MED ORDER — FOLIC ACID 1 MG PO TABS
1.0000 mg | ORAL_TABLET | Freq: Every day | ORAL | Status: DC
  Administered 2016-05-07 – 2016-05-10 (×3): 1 mg via ORAL
  Filled 2016-05-07 (×3): qty 1

## 2016-05-07 MED ORDER — METOPROLOL SUCCINATE ER 50 MG PO TB24
50.0000 mg | ORAL_TABLET | ORAL | Status: DC
  Administered 2016-05-07 – 2016-05-10 (×4): 50 mg via ORAL
  Filled 2016-05-07 (×4): qty 1

## 2016-05-07 MED ORDER — SODIUM CHLORIDE 0.9 % IV SOLN
1.0000 g | Freq: Two times a day (BID) | INTRAVENOUS | Status: DC
  Administered 2016-05-07 – 2016-05-08 (×4): 1 g via INTRAVENOUS
  Filled 2016-05-07 (×7): qty 1

## 2016-05-07 MED ORDER — PIPERACILLIN-TAZOBACTAM 3.375 G IVPB
3.3750 g | Freq: Three times a day (TID) | INTRAVENOUS | Status: DC
  Filled 2016-05-07 (×2): qty 50

## 2016-05-07 MED ORDER — MIRTAZAPINE 15 MG PO TABS
30.0000 mg | ORAL_TABLET | Freq: Every day | ORAL | Status: DC
  Administered 2016-05-07: 30 mg via ORAL
  Filled 2016-05-07: qty 2

## 2016-05-07 MED ORDER — VANCOMYCIN HCL IN DEXTROSE 750-5 MG/150ML-% IV SOLN: 750.0000 mg | INTRAVENOUS | Status: DC

## 2016-05-07 MED ORDER — POLYETHYLENE GLYCOL 3350 17 G PO PACK: 17.0000 g | PACK | Freq: Every day | ORAL | Status: DC | PRN

## 2016-05-07 MED ORDER — SODIUM POLYSTYRENE SULFONATE 15 GM/60ML PO SUSP
30.0000 g | Freq: Once | ORAL | Status: AC
  Administered 2016-05-07: 30 g via ORAL
  Filled 2016-05-07: qty 120

## 2016-05-07 MED ORDER — ACETAMINOPHEN 325 MG PO TABS: 650.0000 mg | ORAL_TABLET | Freq: Four times a day (QID) | ORAL | Status: DC | PRN

## 2016-05-07 MED ORDER — ONDANSETRON HCL 4 MG PO TABS: 4.0000 mg | ORAL_TABLET | Freq: Four times a day (QID) | ORAL | Status: DC | PRN

## 2016-05-07 MED ORDER — VANCOMYCIN HCL IN DEXTROSE 1-5 GM/200ML-% IV SOLN
INTRAVENOUS | Status: AC
  Administered 2016-05-07: 1000 mg via INTRAVENOUS
  Filled 2016-05-07: qty 200

## 2016-05-07 MED ORDER — ONDANSETRON HCL 4 MG/2ML IJ SOLN
4.0000 mg | Freq: Once | INTRAMUSCULAR | Status: AC
  Administered 2016-05-07: 4 mg via INTRAVENOUS

## 2016-05-07 MED ORDER — ONDANSETRON HCL 4 MG/2ML IJ SOLN: 4.0000 mg | Freq: Four times a day (QID) | INTRAMUSCULAR | Status: DC | PRN

## 2016-05-07 MED ORDER — SODIUM CHLORIDE 0.9 % IV SOLN
INTRAVENOUS | Status: DC
  Administered 2016-05-07 – 2016-05-09 (×3): via INTRAVENOUS

## 2016-05-07 MED ORDER — PIPERACILLIN-TAZOBACTAM 3.375 G IVPB 30 MIN
3.3750 g | Freq: Once | INTRAVENOUS | Status: AC
  Administered 2016-05-07: 3.375 g via INTRAVENOUS

## 2016-05-07 MED ORDER — PRIMIDONE 50 MG PO TABS
50.0000 mg | ORAL_TABLET | Freq: Two times a day (BID) | ORAL | Status: DC
  Administered 2016-05-07 – 2016-05-10 (×6): 50 mg via ORAL
  Filled 2016-05-07 (×7): qty 1

## 2016-05-07 MED ORDER — FLUTICASONE PROPIONATE 50 MCG/ACT NA SUSP
2.0000 | Freq: Two times a day (BID) | NASAL | Status: DC
  Administered 2016-05-07 – 2016-05-10 (×6): 2 via NASAL
  Filled 2016-05-07: qty 16

## 2016-05-07 NOTE — Progress Notes (Signed)
Initial Nutrition Assessment  DOCUMENTATION CODES:   Severe malnutrition in context of chronic illness, Underweight  INTERVENTION:   -Recommend downgrading to Dysphagia III diet order and will recommend SLP evaluation if pt at risk for aspiration -Recommend Ensure Enlive po TID, each supplement provides 350 kcal and 20 grams of protein and Magic Cup BID -Pt requiring assistance at meal times, CNA aware and assisted this am   NUTRITION DIAGNOSIS:   Malnutrition related to chronic illness as evidenced by energy intake < or equal to 75% for > or equal to 1 month, severe depletion of body fat, severe depletion of muscle mass, percent weight loss.  GOAL:   Patient will meet greater than or equal to 90% of their needs  MONITOR:   PO intake, Supplement acceptance, Labs, Weight trends, I & O's, Skin  REASON FOR ASSESSMENT:   Malnutrition Screening Tool    ASSESSMENT:   Pt admitted with vomitting with sepsis. Pt with h/o CP from [facility].  Past Medical History  Diagnosis Date  . Cerebral palsy, hemiplegic ([place])     pt is intelligant  . Neurologic gait dysfunction   . Spinal stenosis   . Urge urinary incontinence   . Hypertension   . Arthritis   . Neurogenic bladder   . Hydronephrosis, right   . Chronic cystitis   . Gross hematuria   . RA (rheumatoid arthritis) ([facility])     advanced  . Flexion contracture joint of multiple sites     secondary to advanced ra  . Slurred speech   . Cancer ([place]) skin ca  . Seasonal allergies   . UTI (lower urinary tract infection)   . Sepsis due to urinary tract infection ([place])   . Cough   . RA (rheumatoid arthritis) ([place])   . Edema, lower extremity   . Cataract   . History of ESBL E. coli infection     Diet Order:  DIET DYS 3 Room service appropriate?: Yes with Assist; Fluid consistency:: Thin    Pt ate 0% of breakfast this morning, reports she is not hungry. Pt with poor dentition and missing teeth, unable to chew bagel on  breakfast this am. RD assisted pt to drink some coffee, no difficulty observed. Pt did report she likes yogurt.  Pt reports very poor appetite. Unable to elaborate more however RD noted in paper chart, last physician note, reported pt with poor appetite. RD notes pt taking Medpass BID as well as Ensure Clear BID from [facility] report.   Medications: Folic acid, Remeron, NS at 1mL/hr  Labs: Na 128, K 6.1, BUN 65, glucose 104   Gastrointestinal Profile: Last BM:  05/07/2016   Nutrition-Focused Physical Exam Findings: Nutrition-Focused physical exam completed. Findings are moderate-severe fat depletion, severe muscle depletion, and no edema.     Weight Change: Pt weight 106lbs on last admission on 04/01/2016 (12% weight loss in one month)   Skin:  Reviewed, no issues (Stage I and II pressure ulcers documented last adx 6/1)   Height:   Ht Readings from Last 1 Encounters:  05/06/16 5\' 4"  (1.626 m)    Weight:   Wt Readings from Last 1 Encounters:  05/07/16 93 lb (42.185 kg)   Wt Readings from Last 10 Encounters:  05/07/16 93 lb (42.185 kg)  04/19/16 110 lb (49.896 kg)  04/01/16 106 lb 14.4 oz (48.49 kg)  09/24/15 105 lb (47.628 kg)  07/06/15 95 lb (43.092 kg)  04/02/15 113 lb 12.8 oz (51.619 kg)  01/12/14  124 lb (56.246 kg)  11/18/13 135 lb (61.236 kg)    Ideal Body Weight:   55kg  BMI:  Body mass index is 15.96 kg/(m^2).  Estimated Nutritional Needs:   Kcal:  1650-1925kcals  Protein:  66-85g protein  Fluid:  >/= 1.7L fluid  EDUCATION NEEDS:   No education needs identified at this time  Dwyane Luo, RD, LDN Pager [phone] Weekend/On-Call Pager [phone]

## 2016-05-07 NOTE — H&P (Signed)
[place] [facility] [place] NAME: Stacey Torres    MR#:  [id number]  DATE OF BIRTH:  [birth year]  DATE OF ADMISSION:  05/06/2016  PRIMARY CARE PHYSICIAN: Viviana Simpler, MD   REQUESTING/REFERRING PHYSICIAN:   CHIEF COMPLAINT:   Chief Complaint  Patient presents with  . Emesis    HISTORY OF PRESENT ILLNESS: Stacey Torres  is a 82 y.o. female with a known history of Cerebral palsy, spinal stenosis, urinary incontinence, hypertension, neurogenic bladder, chronic cystitis, rheumatoid arthritis, urinary tract infection in the past was referred from [facility] for nausea and vomiting. Patient was referred for coffee ground emesis according to transfer note but no evidence of any coffee ground emesis in the emergency room. Hemoglobin has been stable when compared to the old records. Patient has cerebral palsy and retarded and unable to give any history. Patient WBC count is elevated and patient was given IV antibiotics in the emergency room. Not much history could be obtained from the patient. Patient is dependent on activities of daily living and instrumental activities of daily living. Evaluation in the emergency room showed elevated potassium and low sodium and patient appeared dehydrated. Patient was given IV fluids based on sepsis protocol in the emergency room. Hospitalist service was consulted for further care of the patient.  PAST MEDICAL HISTORY:   Past Medical History  Diagnosis Date  . Cerebral palsy, hemiplegic ([place])     pt is intelligant  . Neurologic gait dysfunction   . Spinal stenosis   . Urge urinary incontinence   . Hypertension   . Arthritis   . Neurogenic bladder   . Hydronephrosis, right   . Chronic cystitis   . Gross hematuria   . RA (rheumatoid arthritis) (HCC)     advanced  . Flexion contracture joint of multiple sites     secondary to advanced ra  . Slurred speech   . Cancer ([place]) skin ca  .  Seasonal allergies   . UTI (lower urinary tract infection)   . Sepsis due to urinary tract infection ([place])   . Cough   . RA (rheumatoid arthritis) ([place])   . Edema, lower extremity   . Cataract   . History of ESBL E. coli infection     PAST SURGICAL HISTORY: Past Surgical History  Procedure Laterality Date  . Total knee arthroplasty Right 2005  . Cholecystectomy  1995  . Cystoscopy with retrograde pyelogram, ureteroscopy and stent placement Bilateral 11/21/2013    Procedure: CYSTOSCOPY WITH RIGHT RETROGRADE PYELOGRAM, RIGHT FLEXIBLE and rigid URETEROSCOPY  PLACEMENT;  Surgeon: Bernestine Amass, MD;  Location: [facility];  Service: Urology;  Laterality: Bilateral;  . Cystogram  11/21/2013    Procedure: CYSTOGRAM;  Surgeon: Bernestine Amass, MD;  Location: [facility];  Service: Urology;;  . Dg toes rt foot multiple specif ([facility] hx)    . Hysteroscopy w/d&c N/A 05/09/2015    Procedure: DILATATION AND CURETTAGE Pollyann Glen, Myosure;  Surgeon: Gillis Ends, MD;  Location: [facility];  Service: Gynecology;  Laterality: N/A;  . Joint replacement    . Cataract extraction w/phaco Right 09/24/2015    Procedure: CATARACT EXTRACTION PHACO AND INTRAOCULAR LENS PLACEMENT (IOC);  Surgeon: Estill Cotta, MD;  Location: [facility];  Service: Ophthalmology;  Laterality: Right;  [place] 01:53 AP% 26.0 CDE 50.20 fluid pack lot #[id number]    SOCIAL HISTORY:  Social History  Substance Use Topics  . Smoking status: Former Smoker  Quit date: 11/18/1958  . Smokeless tobacco: Never Used  . Alcohol Use: No    FAMILY HISTORY:  Family History  Problem Relation Age of Onset  . CAD Mother   . Deep vein thrombosis Father   . Breast cancer Sister     DRUG ALLERGIES:  Allergies  Allergen Reactions  . Aspirin Other (See Comments)    Gi bleeding  . Macrobid [Nitrofurantoin] Other (See Comments)    Reaction:  Unknown     REVIEW OF SYSTEMS:  Unable to give much  history secondary to cerebral palsy.  MEDICATIONS AT HOME:  Prior to Admission medications   Medication Sig Start Date End Date Taking? Authorizing Provider  acetaminophen (TYLENOL) 650 MG CR tablet Take 650 mg by mouth 3 (three) times daily.    Yes Historical Provider, MD  ALPRAZolam (XANAX) 0.25 MG tablet Take 1 tablet (0.25 mg total) by mouth 2 (two) times daily as needed. Patient taking differently: Take 0.25 mg by mouth See admin instructions. Take 0.25 mg by mouth at 4 pm and 8 pm 04/05/15  Yes Nicholes Mango, MD  Ascorbic Acid (VITAMIN C) 1000 MG tablet Take 1,000 mg by mouth daily. Reported on 05/06/2016   Yes Historical Provider, MD  cephALEXin (KEFLEX) 500 MG capsule Take 1 capsule (500 mg total) by mouth 3 (three) times daily. 04/22/16  Yes Srikar Sudini, MD  DULoxetine (CYMBALTA) 60 MG capsule Take 60 mg by mouth daily.    Yes Historical Provider, MD  feeding supplement, ENSURE ENLIVE, (ENSURE ENLIVE) LIQD Take 237 mLs by mouth 2 (two) times daily between meals. 04/05/15  Yes Nicholes Mango, MD  fluticasone (FLONASE) 50 MCG/ACT nasal spray Place 2 sprays into both nostrils 2 (two) times daily.   Yes Historical Provider, MD  folic acid (FOLVITE) 1 MG tablet Take 1 mg by mouth daily.   Yes Historical Provider, MD  HYDROcodone-acetaminophen (NORCO/VICODIN) 5-325 MG per tablet Take 2 tablets by mouth 2 (two) times daily. Take at 4 pm and 8 pm.   Yes Historical Provider, MD  hydroxychloroquine (PLAQUENIL) 200 MG tablet Take 200 mg by mouth 2 (two) times daily.   Yes Historical Provider, MD  Hypromellose (ARTIFICIAL TEARS OP) Apply 2 drops to eye 2 (two) times daily as needed (for dry eyes).   Yes Historical Provider, MD  loratadine (CLARITIN) 10 MG tablet Take 10 mg by mouth daily.    Yes Historical Provider, MD  metoprolol succinate (TOPROL-XL) 50 MG 24 hr tablet Take 50 mg by mouth every morning. *Hold if B/P is < than 110/60.)   Yes Historical Provider, MD  mirtazapine (REMERON) 30 MG tablet Take  30 mg by mouth at bedtime.   Yes Historical Provider, MD  montelukast (SINGULAIR) 10 MG tablet Take 10 mg by mouth every evening.   Yes Historical Provider, MD  Multiple Vitamin (THEREMS) TABS Take 1 tablet by mouth daily.   Yes Historical Provider, MD  omeprazole (PRILOSEC) 20 MG capsule Take 1 capsule (20 mg total) by mouth daily. 07/08/15  Yes Gladstone Lighter, MD  ondansetron (ZOFRAN) 4 MG tablet Take 4 mg by mouth every 4 (four) hours as needed for nausea or vomiting.    Yes Historical Provider, MD  polyethylene glycol (MIRALAX / GLYCOLAX) packet Take 17 g by mouth daily as needed for mild constipation or moderate constipation.    Yes Historical Provider, MD  potassium chloride SA (K-DUR,KLOR-CON) 20 MEQ tablet Take 1 tablet (20 mEq total) by mouth daily. 04/22/16  Yes Srikar Sudini,  MD  primidone (MYSOLINE) 50 MG tablet Take 50 mg by mouth 2 (two) times daily.    Yes Historical Provider, MD  promethazine (PHENERGAN) 12.5 MG suppository Place 12.5 mg rectally every 6 (six) hours as needed for nausea or vomiting.   Yes Historical Provider, MD  sennosides-docusate sodium (SENOKOT-S) 8.6-50 MG tablet Take 1 tablet by mouth at bedtime.   Yes Historical Provider, MD  traZODone (DESYREL) 50 MG tablet Take 50 mg by mouth at bedtime.   Yes Historical Provider, MD      PHYSICAL EXAMINATION:   VITAL SIGNS: Blood pressure 123/79, pulse 120, temperature 97.9 F (36.6 C), temperature source Oral, resp. rate 18, height 5\' 4"  (1.626 m), weight 45.36 kg (100 lb), SpO2 100 %.  GENERAL:  82 y.o.-year-old patient lying in the bed with no acute distress.  EYES: Pupils equal, round, reactive to light and accommodation. No scleral icterus. Extraocular muscles intact.  HEENT: Head atraumatic, normocephalic. Oropharynx dry and nasopharynx clear.  NECK:  Supple, no jugular venous distention. No thyroid enlargement, no tenderness.  LUNGS: Normal breath sounds bilaterally, no wheezing, rales,rhonchi or crepitation.  No use of accessory muscles of respiration.  CARDIOVASCULAR: S1, S2 normal. No murmurs, rubs, or gallops.  ABDOMEN: Soft, nontender, nondistended. Bowel sounds present. No organomegaly or mass.  EXTREMITIES: No pedal edema, cyanosis, or clubbing. Contracted NEUROLOGIC: has cerebral palsy, moves all extremities. Not completely oriented to time,place and person. PSYCHIATRIC: could not be assessed SKIN: No obvious rash, lesion, or ulcer.   LABORATORY PANEL:   CBC  Recent Labs Lab 05/06/16 2011  WBC 19.1*  HGB 9.9*  HCT 29.0*  PLT 333  MCV 101.5*  MCH 34.5*  MCHC 34.0  RDW 17.2*   ------------------------------------------------------------------------------------------------------------------  Chemistries   Recent Labs Lab 05/06/16 2011  NA 128*  K 6.1*  CL 100*  CO2 17*  GLUCOSE 104*  BUN 65*  CREATININE 0.75  CALCIUM 8.9  AST 14*  ALT 6*  ALKPHOS 87  BILITOT 0.1*   ------------------------------------------------------------------------------------------------------------------ estimated creatinine clearance is 37.5 mL/min (by C-G formula based on Cr of 0.75). ------------------------------------------------------------------------------------------------------------------ No results for input(s): TSH, T4TOTAL, T3FREE, THYROIDAB in the last 72 hours.  Invalid input(s): FREET3   Coagulation profile No results for input(s): INR, PROTIME in the last 168 hours. ------------------------------------------------------------------------------------------------------------------- No results for input(s): DDIMER in the last 72 hours. -------------------------------------------------------------------------------------------------------------------  Cardiac Enzymes No results for input(s): CKMB, TROPONINI, MYOGLOBIN in the last 168 hours.  Invalid input(s):  CK ------------------------------------------------------------------------------------------------------------------ Invalid input(s): POCBNP  ---------------------------------------------------------------------------------------------------------------  Urinalysis    Component Value Date/Time   COLORURINE AMBER* 05/06/2016 2331   APPEARANCEUR TURBID* 05/06/2016 2331   LABSPEC 1.015 05/06/2016 2331   PHURINE 8.0 05/06/2016 2331   GLUCOSEU NEGATIVE 05/06/2016 2331   HGBUR 1+* 05/06/2016 2331   BILIRUBINUR NEGATIVE 05/06/2016 2331   KETONESUR TRACE* 05/06/2016 2331   PROTEINUR 100* 05/06/2016 2331   NITRITE NEGATIVE 05/06/2016 2331   LEUKOCYTESUR 2+* 05/06/2016 2331     RADIOLOGY: Dg Chest Port 1 View  05/07/2016  CLINICAL DATA:  Sepsis.  Hypertension. EXAM: PORTABLE CHEST 1 VIEW COMPARISON:  04/19/2016, 07/06/2015. FINDINGS: Unchanged left diaphragmatic hernia. The lungs are clear. Normal pulmonary vasculature. No large effusions. Hilar and mediastinal contours are unremarkable and unchanged. Severe chronic changes about the shoulders, stable. IMPRESSION: No acute cardiopulmonary findings. Electronically Signed   By: Andreas Newport M.D.   On: 05/07/2016 00:40    EKG: Orders placed or performed during the hospital encounter of 05/06/16  . ED EKG within  10 minutes  . ED EKG within 10 minutes  . EKG 12-Lead  . EKG 12-Lead    IMPRESSION AND PLAN: 82 year old elderly female patient with history of cerebral palsy, spinal stenosis, chronic cystitis, rheumatoid arthritis presented to the emergency room after she was referred from clinics facility for nausea and vomiting. Admitting diagnosis 1. Sepsis 2. Nausea and vomiting 3. Hyponatremia 4. Hyperkalemia 5. Dehydration Treatment plan Admit patient to telemetry Patient DO NOT RESUSCITATE with CODE STATUS IV fluids hydration Start patient on IV vancomycin and IV Zosyn antibiotic Antiemetic medication Oral Kayexalate for  hyperkalemia Follow-up sodium and potassium level DVT prophylaxis sequential compression device to both lower extremities Follow-up hemoglobin and hematocrit. Supportive care.  All the records are reviewed and case discussed with ED provider. Management plans discussed with the patient, family and they are in agreement.  CODE STATUS:DNR Code Status History    Date Active Date Inactive Code Status Order ID Comments User Context   04/19/2016  6:34 PM 04/22/2016  8:28 PM DNR [id number]  Vaughan Basta, MD Inpatient   03/30/2016 11:59 AM 04/01/2016  8:59 PM DNR [id number]  Hillary Bow, MD ED   07/06/2015 10:04 PM 07/08/2015  7:05 PM DNR [id number]  Lytle Butte, MD ED   04/01/2015 10:08 PM 04/05/2015  5:54 PM DNR [id number]  Lytle Butte, MD ED    Questions for Most Recent Historical Code Status (Order [id number])    Question Answer Comment   In the event of cardiac or respiratory ARREST Do not call a "code blue"    In the event of cardiac or respiratory ARREST Do not perform Intubation, CPR, defibrillation or ACLS    In the event of cardiac or respiratory ARREST Use medication by any route, position, wound care, and other measures to relive pain and suffering. May use oxygen, suction and manual treatment of airway obstruction as needed for comfort.     Advance Directive Documentation        Most Recent Value   Type of Advance Directive  Out of facility DNR (pink MOST or yellow form)   Pre-existing out of facility DNR order (yellow form or pink MOST form)  Physician notified to receive inpatient order, Yellow form placed in chart (order not valid for inpatient use)   "MOST" Form in Place?         TOTAL TIME TAKING CARE OF THIS PATIENT: 50 minutes.    Saundra Shelling M.D on 05/07/2016 at 2:28 AM  Between 7am to 6pm - Pager - [phone]  After 6pm go to [email or web address] - password [email or web address] [place] [facility]  Office  [phone]  CC: Primary care physician; Viviana Simpler, MD

## 2016-05-07 NOTE — Progress Notes (Signed)
Patient ID: Stacey Torres, female   DOB: 1929/10/27, 82 y.o.   MRN: [id number] [place] [facility] [place] NAME: Countess Dewey    MR#:  [id number]  DATE OF BIRTH:  1929-05-19  SUBJECTIVE:  Hypotensive/tachycardic. Not able to get much history REVIEW OF SYSTEMS:   Review of Systems  Constitutional: Negative for fever, chills and weight loss.  HENT: Negative for ear discharge, ear pain and nosebleeds.   Eyes: Negative for blurred vision, pain and discharge.  Respiratory: Negative for sputum production, shortness of breath, wheezing and stridor.   Cardiovascular: Negative for chest pain, palpitations, orthopnea and PND.  Gastrointestinal: Negative for nausea, vomiting, abdominal pain and diarrhea.  Genitourinary: Positive for dysuria and urgency. Negative for frequency.  Musculoskeletal: Positive for back pain. Negative for joint pain.       Severe arthritis with contractors and upper extremity  Neurological: Positive for weakness. Negative for sensory change, speech change and focal weakness.  Psychiatric/Behavioral: Negative for depression and hallucinations. The patient is not nervous/anxious.   All other systems reviewed and are negative.  Tolerating Diet:yes Tolerating PT: Bedbound  DRUG ALLERGIES:   Allergies  Allergen Reactions  . Aspirin Other (See Comments)    Gi bleeding  . Macrobid [Nitrofurantoin] Other (See Comments)    Reaction:  Unknown     VITALS:  Blood pressure 103/66, pulse 109, temperature 98.1 F (36.7 C), temperature source Oral, resp. rate 20, height 5\' 4"  (1.626 m), weight 42.185 kg (93 lb), SpO2 88 %.  PHYSICAL EXAMINATION:   Physical Exam  GENERAL:  82 y.o.-year-old patient lying in the bed with no acute distress. Thin cachectic malnourished EYES: Pupils equal, round, reactive to light and accommodation. No scleral icterus. Extraocular muscles intact.  HEENT: Head atraumatic, normocephalic.  Oropharynx and nasopharynx clear. Poor dentition NECK:  Supple, no jugular venous distention. No thyroid enlargement, no tenderness.  LUNGS: Normal breath sounds bilaterally, no wheezing, rales, rhonchi. No use of accessory muscles of respiration.  CARDIOVASCULAR: S1, S2 normal. No murmurs, rubs, or gallops.  ABDOMEN: Soft, nontender, nondistended. Bowel sounds present. No organomegaly or mass.  EXTREMITIES: No cyanosis, clubbing or edema b/l.    NEUROLOGIC: Cranial nerves II through XII are intact. No focal Motor or sensory deficits b/l. Subjective weakness  PSYCHIATRIC:  patient is alert and oriented x 3.  SKIN: Patient has chronic ulcers secondary to being bedbound  patient has heel protectors bilateral lower extremity LABORATORY PANEL:  CBC  Recent Labs Lab 05/07/16 0945  WBC 19.1*  HGB 8.1*  HCT 23.5*  PLT 305    Chemistries   Recent Labs Lab 05/07/16 0945  NA 129*  K 5.1  CL 101  CO2 19*  GLUCOSE 126*  BUN 62*  CREATININE 0.69  CALCIUM 8.8*  AST 17  ALT 6*  ALKPHOS 72  BILITOT 0.5   Cardiac Enzymes No results for input(s): TROPONINI in the last 168 hours. RADIOLOGY:  Dg Chest Port 1 View  05/07/2016  CLINICAL DATA:  Sepsis.  Hypertension. EXAM: PORTABLE CHEST 1 VIEW COMPARISON:  04/19/2016, 07/06/2015. FINDINGS: Unchanged left diaphragmatic hernia. The lungs are clear. Normal pulmonary vasculature. No large effusions. Hilar and mediastinal contours are unremarkable and unchanged. Severe chronic changes about the shoulders, stable. IMPRESSION: No acute cardiopulmonary findings. Electronically Signed   By: Andreas Newport M.D.   On: 05/07/2016 00:40   ASSESSMENT AND PLAN:  82 year old elderly female patient with history of cerebral palsy, spinal stenosis, chronic cystitis, rheumatoid arthritis  presented to the emergency room after she was referred from clinics facility for nausea and vomiting.  1. Sepsis: due to UTI, considering h/o ESBL in urine in past c/s,  will start meropenem  2. Nausea and vomiting: none while here. Monitor  3. Hyponatremia: improving with IVF. Likely due to dehydration  4. Hyperkalemia: resolved, s/p kayexalte  5. Dehydration: continue IVF  6. Anemia of Chronic Dz: monitor Hb  Palliative care c/s  Case discussed with Care Management/Social Worker. Management plans discussed with the nursing.  CODE STATUS: DO NOT RESUSCITATE    TOTAL TIME TAKING CARE OF THIS PATIENT: 28s.    POSSIBLE D/C IN 1-2  DAYS, DEPENDING ON CLINICAL CONDITION.  Note: This dictation was prepared with Dragon dictation along with smaller phrase technology. Any transcriptional errors that result from this process are unintentional.  [facility], Yared Susan M.D on 05/07/2016 at 5:12 PM  Between 7am to 6pm - Pager - [phone]  After 6pm go to [email or web address] - password [email or web address] [place] [facility]  Office  [phone]  CC: Primary care physician; Viviana Simpler, MD

## 2016-05-07 NOTE — Care Management (Addendum)
REadmit from [facility] facility reported for vomiting of coffee ground emesis.  There was no evidence of this in the ED.  She is admitted for sepsis with a white count of 19.1.  Placed CSW consult.  It appears patient is a long term care resident at the facility.  Palliative care consult is pending

## 2016-05-07 NOTE — Progress Notes (Signed)
Pharmacy Antibiotic Note  Stacey Torres is a 82 y.o. female admitted on 05/06/2016 with sepsis.  Pharmacy has been consulted for vancomycin and Zosyn dosing.  Plan: DW 45.4kg  Vd 32L kei 0.036 hr-1  T1/2 19 hours. Vancomycin 750 mg q 24 hours ordered with stacked dosing. Level before 5th dose. Goal trough 15-20.  Zosyn 4.5 grams q 8 hours ordered for Pseudomonas risk of recent abx exposure.   Height: 5\' 4"  (162.6 cm) Weight: 100 lb (45.36 kg) IBW/kg (Calculated) : 54.7  Temp (24hrs), Avg:97.9 F (36.6 C), Min:97.9 F (36.6 C), Max:97.9 F (36.6 C)   Recent Labs Lab 05/06/16 2011 05/06/16 2354 05/07/16 0542  WBC 19.1*  --   --   CREATININE 0.75  --   --   LATICACIDVEN  --  1.2 0.9    Estimated Creatinine Clearance: 37.5 mL/min (by C-G formula based on Cr of 0.75).    Allergies  Allergen Reactions  . Aspirin Other (See Comments)    Gi bleeding  . Macrobid [Nitrofurantoin] Other (See Comments)    Reaction:  Unknown     Antimicrobials this admission: vancomycin  >>  Zosyn  >>   Dose adjustments this admission:   Microbiology results: 6/28 BCx: pending   6/27 CXR: no acute disease 6/27 UA: LE(+) NO2(-) WBC TNTC  Thank you for allowing pharmacy to be a part of this patient's care.  Khyson Sebesta S 05/07/2016 6:41 AM

## 2016-05-07 NOTE — NC FL2 (Signed)
[place] LEVEL OF CARE SCREENING TOOL     IDENTIFICATION  Patient Name: Stacey Torres Birthdate: 1929-02-28 Sex: female Admission Date (Current Location): 05/06/2016  [place] and [place] Number:  Engineering geologist and Address:  [facility], [address] [place], [place] [address]      Provider Number: [id number]  Attending Physician Name and Address:  Max Sane, MD  Relative Name and Phone Number:       Current Level of Care: Hospital Recommended Level of Care: [place] Prior Approval Number:    Date Approved/Denied:   PASRR Number: [id number]  Discharge Plan: SNF    Current Diagnoses: Patient Active Problem List   Diagnosis Date Noted  . Pressure ulcer 04/20/2016  . ESBL (extended spectrum beta-lactamase) producing bacteria infection 04/19/2016  . UTI (lower urinary tract infection) 03/30/2016  . Protein-calorie malnutrition, severe ([place]) 07/07/2015  . Sepsis ([place]) 07/06/2015  . Upper GI bleed 07/06/2015  . Hyperkalemia 07/06/2015  . Endometrial thickening on ultra sound   . Malnutrition of moderate degree ([place]) 04/02/2015  . Rheumatoid arthritis ([place]) 04/01/2015  . Essential hypertension 04/01/2015  . Hydronephrosis of right kidney 11/21/2013    Orientation RESPIRATION BLADDER Height & Weight     Self  Normal Continent Weight: 93 lb (42.185 kg) Height:  5\' 4"  (162.6 cm)  BEHAVIORAL SYMPTOMS/MOOD NEUROLOGICAL BOWEL NUTRITION STATUS   (none)   Continent Diet (dysphagia 3)  AMBULATORY STATUS COMMUNICATION OF NEEDS Skin   Total Care Verbally PU Stage and Appropriate Care                       Personal Care Assistance Level of Assistance  Total care Bathing Assistance: Maximum assistance Feeding assistance: Maximum assistance Dressing Assistance: Maximum assistance Total Care Assistance: Maximum assistance   Functional Limitations Info             SPECIAL CARE FACTORS  FREQUENCY                       Contractures Contractures Info: Not present    Additional Factors Info  Code Status, Isolation Precautions Code Status Info: DNR Allergies Info: ASA, macrobid           Current Medications (05/07/2016):  This is the current hospital active medication list Current Facility-Administered Medications  Medication Dose Route Frequency Provider Last Rate Last Dose  . 0.9 %  sodium chloride infusion   Intravenous Continuous Saundra Shelling, MD 75 mL/hr at 05/07/16 1509    . acetaminophen (TYLENOL) tablet 650 mg  650 mg Oral Q6H PRN Saundra Shelling, MD       Or  . acetaminophen (TYLENOL) suppository 650 mg  650 mg Rectal Q6H PRN Pavan Pyreddy, MD      . ALPRAZolam Duanne Moron) tablet 0.25 mg  0.25 mg Oral BID PRN Saundra Shelling, MD      . artificial tears (LACRILUBE) ophthalmic ointment   Both Eyes Q8H Pavan Pyreddy, MD      . DULoxetine (CYMBALTA) DR capsule 60 mg  60 mg Oral Daily Saundra Shelling, MD   60 mg at 05/07/16 0948  . feeding supplement (ENSURE ENLIVE) (ENSURE ENLIVE) liquid 237 mL  237 mL Oral TID BM Max Sane, MD   237 mL at 05/07/16 1502  . fluticasone (FLONASE) 50 MCG/ACT nasal spray 2 spray  2 spray Each Nare BID Saundra Shelling, MD   2 spray at 05/07/16 0952  . folic acid (  FOLVITE) tablet 1 mg  1 mg Oral Daily Saundra Shelling, MD   1 mg at 05/07/16 0948  . HYDROcodone-acetaminophen (NORCO/VICODIN) 5-325 MG per tablet 2 tablet  2 tablet Oral BID Saundra Shelling, MD   2 tablet at 05/07/16 0948  . meropenem (MERREM) 1 g in sodium chloride 0.9 % 100 mL IVPB  1 g Intravenous Q12H Vipul Shah, MD   1 g at 05/07/16 1501  . metoprolol succinate (TOPROL-XL) 24 hr tablet 50 mg  50 mg Oral Lesia Sago, MD   50 mg at 05/07/16 0824  . mirtazapine (REMERON) tablet 30 mg  30 mg Oral QHS Pavan Pyreddy, MD      . ondansetron (ZOFRAN) tablet 4 mg  4 mg Oral Q6H PRN Saundra Shelling, MD       Or  . ondansetron (ZOFRAN) injection 4 mg  4 mg Intravenous Q6H PRN Pavan  Pyreddy, MD      . polyethylene glycol (MIRALAX / GLYCOLAX) packet 17 g  17 g Oral Daily PRN Saundra Shelling, MD      . primidone (MYSOLINE) tablet 50 mg  50 mg Oral BID Saundra Shelling, MD   50 mg at 05/07/16 0952  . promethazine (PHENERGAN) suppository 12.5 mg  12.5 mg Rectal Q6H PRN Saundra Shelling, MD      . traZODone (DESYREL) tablet 50 mg  50 mg Oral QHS Saundra Shelling, MD         Discharge Medications: Please see discharge summary for a list of discharge medications.  Relevant Imaging Results:  Relevant Lab Results:   Additional Information    Shela Leff, LCSW

## 2016-05-07 NOTE — Progress Notes (Signed)
Patient appear to be tired, congested, MD notified, patient place on contact precaution for ESBL, MD notified. IV fluid infusing due to low NA.

## 2016-05-07 NOTE — ED Notes (Signed)
MD at bedside. 

## 2016-05-07 NOTE — ED Provider Notes (Signed)
Time Seen: Approximately *2100  I have reviewed the triage notes  Chief Complaint: Emesis   History of Present Illness: Stacey Torres is a 82 y.o. female *who has a history of cerebral palsy. The patient's had recent issues with urinary incontinence and a neurogenic bladder. She appears to be having frequent urinary tract infections and was transported here by EMS for evaluation. Patient's currently a resident at [facility]. She apparently said some nausea and vomiting over the last 3 days with decreased oral food and fluid intake. There may have been some dark emesis. No coffee grounds was identified. Patient's not had any melena or hematochezia.   Past Medical History  Diagnosis Date  . Cerebral palsy, hemiplegic ([place])     pt is intelligant  . Neurologic gait dysfunction   . Spinal stenosis   . Urge urinary incontinence   . Hypertension   . Arthritis   . Neurogenic bladder   . Hydronephrosis, right   . Chronic cystitis   . Gross hematuria   . RA (rheumatoid arthritis) ([facility])     advanced  . Flexion contracture joint of multiple sites     secondary to advanced ra  . Slurred speech   . Cancer ([place]) skin ca  . Seasonal allergies   . UTI (lower urinary tract infection)   . Sepsis due to urinary tract infection ([place])   . Cough   . RA (rheumatoid arthritis) ([place])   . Edema, lower extremity   . Cataract   . History of ESBL E. coli infection     Patient Active Problem List   Diagnosis Date Noted  . Pressure ulcer 04/20/2016  . ESBL (extended spectrum beta-lactamase) producing bacteria infection 04/19/2016  . UTI (lower urinary tract infection) 03/30/2016  . Protein-calorie malnutrition, severe ([place]) 07/07/2015  . Sepsis ([place]) 07/06/2015  . Upper GI bleed 07/06/2015  . Hyperkalemia 07/06/2015  . Endometrial thickening on ultra sound   . Malnutrition of moderate degree ([place]) 04/02/2015  . Rheumatoid arthritis ([place]) 04/01/2015  . Essential hypertension 04/01/2015  .  Hydronephrosis of right kidney 11/21/2013    Past Surgical History  Procedure Laterality Date  . Total knee arthroplasty Right 2005  . Cholecystectomy  1995  . Cystoscopy with retrograde pyelogram, ureteroscopy and stent placement Bilateral 11/21/2013    Procedure: CYSTOSCOPY WITH RIGHT RETROGRADE PYELOGRAM, RIGHT FLEXIBLE and rigid URETEROSCOPY  PLACEMENT;  Surgeon: Bernestine Amass, MD;  Location: [facility];  Service: Urology;  Laterality: Bilateral;  . Cystogram  11/21/2013    Procedure: CYSTOGRAM;  Surgeon: Bernestine Amass, MD;  Location: [facility];  Service: Urology;;  . Dg toes rt foot multiple specif ([facility] hx)    . Hysteroscopy w/d&c N/A 05/09/2015    Procedure: DILATATION AND CURETTAGE Pollyann Glen, Myosure;  Surgeon: Gillis Ends, MD;  Location: [facility];  Service: Gynecology;  Laterality: N/A;  . Joint replacement    . Cataract extraction w/phaco Right 09/24/2015    Procedure: CATARACT EXTRACTION PHACO AND INTRAOCULAR LENS PLACEMENT (IOC);  Surgeon: Estill Cotta, MD;  Location: [facility];  Service: Ophthalmology;  Laterality: Right;  [place] 01:53 AP% 26.0 CDE 50.20 fluid pack lot [id number]    Past Surgical History  Procedure Laterality Date  . Total knee arthroplasty Right 2005  . Cholecystectomy  1995  . Cystoscopy with retrograde pyelogram, ureteroscopy and stent placement Bilateral 11/21/2013    Procedure: CYSTOSCOPY WITH RIGHT RETROGRADE PYELOGRAM, RIGHT FLEXIBLE and rigid URETEROSCOPY  PLACEMENT;  Surgeon: Camelia Eng  Risa Grill, MD;  Location: [facility];  Service: Urology;  Laterality: Bilateral;  . Cystogram  11/21/2013    Procedure: CYSTOGRAM;  Surgeon: Bernestine Amass, MD;  Location: [facility];  Service: Urology;;  . Dg toes rt foot multiple specif ([facility] hx)    . Hysteroscopy w/d&c N/A 05/09/2015    Procedure: DILATATION AND CURETTAGE Pollyann Glen, Myosure;  Surgeon: Gillis Ends, MD;  Location:  [facility];  Service: Gynecology;  Laterality: N/A;  . Joint replacement    . Cataract extraction w/phaco Right 09/24/2015    Procedure: CATARACT EXTRACTION PHACO AND INTRAOCULAR LENS PLACEMENT (IOC);  Surgeon: Estill Cotta, MD;  Location: [facility];  Service: Ophthalmology;  Laterality: Right;  [place] 01:53 AP% 26.0 CDE 50.20 fluid pack lot [id number]    Current Outpatient Rx  Name  Route  Sig  Dispense  Refill  . acetaminophen (TYLENOL) 650 MG CR tablet   Oral   Take 650 mg by mouth 3 (three) times daily.          Marland Kitchen ALPRAZolam (XANAX) 0.25 MG tablet   Oral   Take 1 tablet (0.25 mg total) by mouth 2 (two) times daily as needed. Patient taking differently: Take 0.25 mg by mouth See admin instructions. Take 0.25 mg by mouth at 4 pm and 8 pm   10 tablet   0   . Ascorbic Acid (VITAMIN C) 1000 MG tablet   Oral   Take 1,000 mg by mouth daily. Reported on 05/06/2016         . cephALEXin (KEFLEX) 500 MG capsule   Oral   Take 1 capsule (500 mg total) by mouth 3 (three) times daily.   15 capsule   0   . DULoxetine (CYMBALTA) 60 MG capsule   Oral   Take 60 mg by mouth daily.          . feeding supplement, ENSURE ENLIVE, (ENSURE ENLIVE) LIQD   Oral   Take 237 mLs by mouth 2 (two) times daily between meals.   237 mL   12   . fluticasone (FLONASE) 50 MCG/ACT nasal spray   Each Nare   Place 2 sprays into both nostrils 2 (two) times daily.         . folic acid (FOLVITE) 1 MG tablet   Oral   Take 1 mg by mouth daily.         Marland Kitchen HYDROcodone-acetaminophen (NORCO/VICODIN) 5-325 MG per tablet   Oral   Take 2 tablets by mouth 2 (two) times daily. Take at 4 pm and 8 pm.         . hydroxychloroquine (PLAQUENIL) 200 MG tablet   Oral   Take 200 mg by mouth 2 (two) times daily.         . Hypromellose (ARTIFICIAL TEARS OP)   Ophthalmic   Apply 2 drops to eye 2 (two) times daily as needed (for dry eyes).         Marland Kitchen loratadine (CLARITIN) 10 MG tablet   Oral   Take 10 mg  by mouth daily.          . metoprolol succinate (TOPROL-XL) 50 MG 24 hr tablet   Oral   Take 50 mg by mouth every morning. *Hold if B/P is < than 110/60.)         . mirtazapine (REMERON) 30 MG tablet   Oral   Take 30 mg by mouth at bedtime.         Marland Kitchen  montelukast (SINGULAIR) 10 MG tablet   Oral   Take 10 mg by mouth every evening.         . Multiple Vitamin (THEREMS) TABS   Oral   Take 1 tablet by mouth daily.         Marland Kitchen omeprazole (PRILOSEC) 20 MG capsule   Oral   Take 1 capsule (20 mg total) by mouth daily.   60 capsule   0   . ondansetron (ZOFRAN) 4 MG tablet   Oral   Take 4 mg by mouth every 4 (four) hours as needed for nausea or vomiting.          . polyethylene glycol (MIRALAX / GLYCOLAX) packet   Oral   Take 17 g by mouth daily as needed for mild constipation or moderate constipation.          . potassium chloride SA (K-DUR,KLOR-CON) 20 MEQ tablet   Oral   Take 1 tablet (20 mEq total) by mouth daily.         . primidone (MYSOLINE) 50 MG tablet   Oral   Take 50 mg by mouth 2 (two) times daily.          . promethazine (PHENERGAN) 12.5 MG suppository   Rectal   Place 12.5 mg rectally every 6 (six) hours as needed for nausea or vomiting.         . sennosides-docusate sodium (SENOKOT-S) 8.6-50 MG tablet   Oral   Take 1 tablet by mouth at bedtime.         . traZODone (DESYREL) 50 MG tablet   Oral   Take 50 mg by mouth at bedtime.           Allergies:  Aspirin and Macrobid  Family History: Family History  Problem Relation Age of Onset  . CAD Mother   . Deep vein thrombosis Father   . Breast cancer Sister     Social History: Social History  Substance Use Topics  . Smoking status: Former Smoker    Quit date: 11/18/1958  . Smokeless tobacco: Never Used  . Alcohol Use: No     Review of Systems:   10 point review of systems was performed and was otherwise negative:  Constitutional: No fever Eyes: No visual  disturbances ENT: No sore throat, ear pain Cardiac: No chest pain Respiratory: No shortness of breath, wheezing, or stridor Abdomen: No abdominal pain, no vomiting, No diarrhea Endocrine: No weight loss, No night sweats Extremities: No peripheral edema, cyanosis Skin: No rashes, easy bruising Neurologic: No focal weakness, trouble with speech or swollowing Urologic: No dysuria, Hematuria, or urinary frequency   Physical Exam:  ED Triage Vitals  Enc Vitals Group     BP 05/06/16 2007 138/93 mmHg     Pulse Rate 05/06/16 2012 136     Resp 05/06/16 2111 20     Temp 05/06/16 2007 97.9 F (36.6 C)     Temp Source 05/06/16 2007 Oral     SpO2 05/06/16 2111 96 %     Weight 05/06/16 2007 100 lb (45.36 kg)     Height 05/06/16 2007 5\' 4"  (1.626 m)     Head Cir --      Peak Flow --      Pain Score 05/06/16 2008 0     Pain Loc --      Pain Edu? --      Excl. in [place]? --     General: Awake , Alert , and Oriented  times 3; Patient will respond to some commands to obvious effects of cerebral palsy. Head: Normal cephalic , atraumatic Eyes: Pupils equal , round, reactive to light. Nose/Throat: No nasal drainage, patent upper airway without erythema or exudate. Dry mucous membranes Neck: Supple, Full range of motion, No anterior adenopathy or palpable thyroid masses Lungs: Clear to ascultation without wheezes , rhonchi, or rales Heart: Regular rate, regular rhythm without murmurs , gallops , or rubs Abdomen: Soft, non tender without rebound, guarding , or rigidity; bowel sounds positive and symmetric in all 4 quadrants. No organomegaly .        Extremities: 2 plus symmetric pulses. No edema, clubbing or cyanosis Neurologic: normal ambulation, Motor symmetric without deficits, sensory intact Skin: warm, dry, no rashes   Labs:   All laboratory work was reviewed including any pertinent negatives or positives listed below:  Labs Reviewed  COMPREHENSIVE METABOLIC PANEL - Abnormal; Notable for  the following:    Sodium 128 (*)    Potassium 6.1 (*)    Chloride 100 (*)    CO2 17 (*)    Glucose, Bld 104 (*)    BUN 65 (*)    Albumin 2.6 (*)    AST 14 (*)    ALT 6 (*)    Total Bilirubin 0.1 (*)    All other components within normal limits  CBC - Abnormal; Notable for the following:    WBC 19.1 (*)    RBC 2.85 (*)    Hemoglobin 9.9 (*)    HCT 29.0 (*)    MCV 101.5 (*)    MCH 34.5 (*)    RDW 17.2 (*)    All other components within normal limits  CULTURE, BLOOD (ROUTINE X 2)  CULTURE, BLOOD (ROUTINE X 2)  LACTIC ACID, PLASMA  URINALYSIS COMPLETEWITH MICROSCOPIC ([facility] ONLY)  LACTIC ACID, PLASMA  TYPE AND SCREEN  Patient's white count significantly elevated. Her hemoglobin seems to be stable. Sodium low at 128 and also has findings of dehydration with a significantly elevated BUN  EKG:  ED ECG REPORT I, Daymon Larsen, the attending physician, personally viewed and interpreted this ECG.  Date: 05/07/2016 EKG Time: *2014 Rate: *135 Rhythm: normal sinus rhythm QRS Axis: normal Intervals: normal ST/T Wave abnormalities: Nonspecific T-wave abnormalities Conduction Disturbances: none Narrative Interpretation: unremarkable   Radiology:      DG Chest Port 1 View (Final result) Result time: 05/07/16 00:40:55   Final result by Rad Results In Interface (05/07/16 00:40:55)   Narrative:   CLINICAL DATA: Sepsis. Hypertension.  EXAM: PORTABLE CHEST 1 VIEW  COMPARISON: 04/19/2016, 07/06/2015.  FINDINGS: Unchanged left diaphragmatic hernia. The lungs are clear. Normal pulmonary vasculature. No large effusions. Hilar and mediastinal contours are unremarkable and unchanged.  Severe chronic changes about the shoulders, stable.  IMPRESSION: No acute cardiopulmonary findings.       I personally reviewed the radiologic studies   CRITICAL CARE Performed by: Daymon Larsen   Total critical care time: 31 minutes  Critical care time was exclusive of  separately billable procedures and treating other patients.  Critical care was necessary to treat or prevent imminent or life-threatening deterioration.  Critical care was time spent personally by me on the following activities: development of treatment plan with patient and/or surrogate as well as nursing, discussions with consultants, evaluation of patient's response to treatment, examination of patient, obtaining history from patient or surrogate, ordering and performing treatments and interventions, ordering and review of laboratory studies, ordering and review of radiographic  studies, pulse oximetry and re-evaluation of patient's condition. Initiation evaluation and treatment for acute sepsis. Patient's currently hemodynamically stable other than persistent tachycardia blood pressures remained stable.  ED Course: * Patient's stay here was uneventful and she was started on IV fluid resuscitation. The assumption is that her urine is infected were still waiting for the urine and is been in the lab now for least an hour. Likely the urine I went ahead and started IV antibiotics. Patient's also had fluid resuscitation. Her lactic acid at this time is negative.    Assessment: * Sepsis   Final Clinical Impression:  Final diagnoses:  Sepsis, due to unspecified organism [facility])  Dehydration     Plan:  Inpatient management           Daymon Larsen, MD 05/07/16 [phone]

## 2016-05-07 NOTE — Clinical Social Work Note (Signed)
Clinical Social Work Assessment  Patient Details  Name: Stacey Torres MRN: [id number] Date of Birth: [birth year]  Date of referral:  05/07/16               Reason for consult:  Facility Placement                Permission sought to share information with:    Permission granted to share information::     Name::        Agency::     Relationship::     Contact Information:     Housing/Transportation Living arrangements for the past 2 months:  [place] of Information:    Patient Interpreter Needed:  None Criminal Activity/Legal Involvement Pertinent to Current Situation/Hospitalization:  No - Comment as needed Significant Relationships:  Other(Comment) (niece: Marilynn [id number] [phone]) Lives with:  Facility Resident Do you feel safe going back to the place where you live?    Need for family participation in patient care:     Care giving concerns:  none   Social Worker assessment / plan:  Patient is known to this CSW from a previous admission she had this month. Patient is a long term resident at [facility] and has been there for 4 years. Patient's responsible party is her niece: Darrick Meigs. CSW has left a message for Ms. Fox to return call. Ms. Hassell Done also works at [employer]. Seth Bake at [facility] states that patient can return with no issues.   Employment status:  Retired Nurse, adult PT Recommendations:  Not assessed at this time Information / Referral to community resources:     Patient/Family's Response to care:  CSW unable to reach Ms. Fox and patient is unable to participate in an assessment.  Patient/Family's Understanding of and Emotional Response to Diagnosis, Current Treatment, and Prognosis:  CSW unable to reach Ms. Fox and patient is unable to participate in an assessment.  Emotional Assessment Appearance:    Attitude/Demeanor/Rapport:  Unable to Assess Affect (typically observed):  Unable to Assess Orientation:   Oriented to Self Alcohol / Substance use:  Not Applicable Psych involvement (Current and /or in the community):  No (Comment)  Discharge Needs  Concerns to be addressed:  Care Coordination Readmission within the last 30 days:  Yes Current discharge risk:  None Barriers to Discharge:  No Barriers Identified   Shela Leff, LCSW 05/07/2016, 4:26 PM

## 2016-05-07 NOTE — Progress Notes (Addendum)
Pharmacy Antibiotic Note  Stacey Torres is a 82 y.o. female admitted on 05/06/2016 with urosepsis.  Patient on vancomycin and zosyn, discussed with attending MD. Suspect urinary source, patient with recent ESBL e. Coli UTI in May, will discontinue vancomycin and change zosyn to meropenem for now, deescalate based on cultures.   Plan: Meropenem 1gm IV Q12H (renal dose adjustment of 1gm Q8H)  Height: 5\' 4"  (162.6 cm) Weight: 93 lb (42.185 kg) IBW/kg (Calculated) : 54.7  Temp (24hrs), Avg:98.3 F (36.8 C), Min:97.9 F (36.6 C), Max:98.9 F (37.2 C)   Recent Labs Lab 05/06/16 2011 05/06/16 2354 05/07/16 0542 05/07/16 0945  WBC 19.1*  --   --  19.1*  CREATININE 0.75  --   --  0.69  LATICACIDVEN  --  1.2 0.9 1.4    Estimated Creatinine Clearance: 34.9 mL/min (by C-G formula based on Cr of 0.69).    Allergies  Allergen Reactions  . Aspirin Other (See Comments)    Gi bleeding  . Macrobid [Nitrofurantoin] Other (See Comments)    Reaction:  Unknown     Antimicrobials this admission: Ceftriaxone x 1 6/28 Vancomycin x 1 6/28 Zosyn x 1 6/28 Meropenem 6/28 >>  Dose adjustments this admission:   Microbiology results: 6/28 BCx: pending 6/28 Urine:  6/27 CXR: no acute disease 6/27 UA: LE(+) NO2(-) WBC TNTC 6/26 MRSA PCR: negative  Thank you for allowing pharmacy to be a part of this patient's care.  Othelia Riederer C 05/07/2016 2:32 PM

## 2016-05-08 LAB — BASIC METABOLIC PANEL
ANION GAP: 6 (ref 5–15)
BUN: 40 mg/dL — ABNORMAL HIGH (ref 6–20)
CHLORIDE: 108 mmol/L (ref 101–111)
CO2: 19 mmol/L — AB (ref 22–32)
Calcium: 7.7 mg/dL — ABNORMAL LOW (ref 8.9–10.3)
Creatinine, Ser: 0.41 mg/dL — ABNORMAL LOW (ref 0.44–1.00)
GFR calc non Af Amer: >60 mL/min (ref >60)
GLUCOSE: 94 mg/dL (ref 65–99)
POTASSIUM: 3.2 mmol/L — AB (ref 3.5–5.1)
Sodium: 133 mmol/L — ABNORMAL LOW (ref 135–145)

## 2016-05-08 LAB — CBC
HEMATOCRIT: 18.7 % — AB (ref 35.0–47.0)
HEMOGLOBIN: 6.2 g/dL — AB (ref 12.0–16.0)
MCH: 33.2 pg (ref 26.0–34.0)
MCHC: 33.4 g/dL (ref 32.0–36.0)
MCV: 99.4 fL (ref 80.0–100.0)
Platelets: 249 10*3/uL (ref 150–440)
RBC: 1.88 MIL/uL — ABNORMAL LOW (ref 3.80–5.20)
RDW: 17.4 % — ABNORMAL HIGH (ref 11.5–14.5)
WBC: 12 10*3/uL — AB (ref 3.6–11.0)

## 2016-05-08 LAB — FERRITIN: FERRITIN: 705 ng/mL — AB (ref 11–307)

## 2016-05-08 LAB — PREPARE RBC (CROSSMATCH)

## 2016-05-08 MED ORDER — PANTOPRAZOLE SODIUM 40 MG PO TBEC
40.0000 mg | DELAYED_RELEASE_TABLET | Freq: Every day | ORAL | Status: DC
  Administered 2016-05-08 – 2016-05-10 (×3): 40 mg via ORAL
  Filled 2016-05-08 (×3): qty 1

## 2016-05-08 MED ORDER — QUETIAPINE FUMARATE 25 MG PO TABS
25.0000 mg | ORAL_TABLET | Freq: Every day | ORAL | Status: DC
  Administered 2016-05-08 – 2016-05-09 (×2): 25 mg via ORAL
  Filled 2016-05-08 (×2): qty 1

## 2016-05-08 MED ORDER — SENNOSIDES-DOCUSATE SODIUM 8.6-50 MG PO TABS
1.0000 | ORAL_TABLET | Freq: Every day | ORAL | Status: DC
  Administered 2016-05-08 – 2016-05-09 (×2): 1 via ORAL
  Filled 2016-05-08 (×2): qty 1

## 2016-05-08 MED ORDER — SODIUM CHLORIDE 0.9 % IV SOLN: Freq: Once | INTRAVENOUS | Status: DC

## 2016-05-08 MED ORDER — ALPRAZOLAM 0.25 MG PO TABS
0.2500 mg | ORAL_TABLET | Freq: Two times a day (BID) | ORAL | Status: DC
  Administered 2016-05-08 – 2016-05-09 (×4): 0.25 mg via ORAL
  Filled 2016-05-08 (×4): qty 1

## 2016-05-08 NOTE — Progress Notes (Deleted)
CSW was informed by [facility] that patient will return home with Hospice. Informed Seth Bake- Admissions Coordinator at [facility] of above. CSW is signing off but is available if a CSW need were to arise.  Ernest Pine, MSW, [place], [place] Clinical Social Worker [phone]

## 2016-05-08 NOTE — Clinical Documentation Improvement (Signed)
Hospitalist  Can the diagnosis of pressure ulcer be further specified?   Document if pressure ulcer with stage is Present on Admission   Document Site with laterality - Elbow, Back (upper/lower), Sacral, Hip, Buttock, Ankle, Heel, Head, Other (Specify)  Pressure Ulcer Stage - Stage1, Stage 2, Stage 3, Stage 4, Unstageable, Unspecified, Unable to Clinically Determine  Document any associated diagnoses/conditions such as: with gangrene  Other  Clinically Undetermined    Supporting Information: Chronic ulcers secondary to being bedbound per 6/28 progress notes.   Please exercise your independent, professional judgment when responding. A specific answer is not anticipated or expected.   Thank You,  [place] [phone]

## 2016-05-08 NOTE — Progress Notes (Signed)
Notified Dr. Jannifer Franklin of 6 beat run of [place].

## 2016-05-08 NOTE — Progress Notes (Signed)
Patient ID: Stacey Torres, female   DOB: 10/14/1929, 82 y.o.   MRN: [id number] [facility] PROGRESS NOTE  Carynn Rhett [id number] DOB: 10/21/1929 DOA: 05/06/2016 PCP: Viviana Simpler, MD  HPI/Subjective: Patient feels okay. Offers no complaints. As per nurse, last night she received a lot of medications to try to sleep. She does answer some yes or no questions.  Objective: Filed Vitals:   05/08/16 1122 05/08/16 1151  BP: 86/41 106/53  Pulse: 66 71  Temp: 97.6 F (36.4 C) 97.5 F (36.4 C)  Resp: 17 19    Filed Weights   05/06/16 2007 05/07/16 0645 05/08/16 0630  Weight: 45.36 kg (100 lb) 42.185 kg (93 lb) 43.954 kg (96 lb 14.4 oz)    ROS: Review of Systems  Constitutional: Negative for fever and chills.  Eyes: Negative for blurred vision.  Respiratory: Negative for cough and shortness of breath.   Cardiovascular: Negative for chest pain.  Gastrointestinal: Negative for nausea, vomiting, abdominal pain, diarrhea and constipation.  Genitourinary: Negative for dysuria.  Musculoskeletal: Negative for joint pain.  Neurological: Negative for dizziness and headaches.   Exam: Physical Exam  HENT:  Nose: No mucosal edema.  Mouth/Throat: No oropharyngeal exudate or posterior oropharyngeal edema.  Eyes: EOM and lids are normal. Pupils are equal, round, and reactive to light.  Conjunctiva pale  Neck: No JVD present. Carotid bruit is not present. No edema present. No thyroid mass and no thyromegaly present.  Cardiovascular: S1 normal and S2 normal.  Exam reveals no gallop.   No murmur heard. Pulses:      Dorsalis pedis pulses are 2+ on the right side, and 2+ on the left side.  Respiratory: No respiratory distress. She has no wheezes. She has no rhonchi. She has no rales.  GI: Soft. Bowel sounds are normal. There is no tenderness.  Musculoskeletal:       Right ankle: She exhibits swelling.       Left ankle: She exhibits swelling.  Lymphadenopathy:    She has no  cervical adenopathy.  Neurological: She is alert.  Skin: Skin is warm. No rash noted. Nails show no clubbing.  No decubiti seen on buttock or heels. She does have a scab on her toe.  Psychiatric: She has a normal mood and affect.      Data Reviewed: Basic Metabolic Panel:  Recent Labs Lab 05/06/16 2011 05/07/16 0945 05/08/16 0422  NA 128* 129* 133*  K 6.1* 5.1 3.2*  CL 100* 101 108  CO2 17* 19* 19*  GLUCOSE 104* 126* 94  BUN 65* 62* 40*  CREATININE 0.75 0.69 0.41*  CALCIUM 8.9 8.8* 7.7*   Liver Function Tests:  Recent Labs Lab 05/06/16 2011 05/07/16 0945  AST 14* 17  ALT 6* 6*  ALKPHOS 87 72  BILITOT 0.1* 0.5  PROT 6.7 5.9*  ALBUMIN 2.6* 2.2*   CBC:  Recent Labs Lab 05/06/16 2011 05/07/16 0945 05/08/16 0422  WBC 19.1* 19.1* 12.0*  NEUTROABS  --  16.7*  --   HGB 9.9* 8.1* 6.2*  HCT 29.0* 23.5* 18.7*  MCV 101.5* 98.3 99.4  PLT 333 305 249     Recent Results (from the past 240 hour(s))  Culture, blood (Routine X 2) w Reflex to ID Panel     Status: None (Preliminary result)   Collection Time: 05/06/16 11:50 PM  Result Value Ref Range Status   Specimen Description BLOOD LEFT THUMB  Final   Special Requests   Final    BOTTLES DRAWN AEROBIC AND  ANAEROBIC  2CCAERO, 3CCANA   Culture NO GROWTH 1 DAY  Final   Report Status PENDING  Incomplete  Culture, blood (Routine X 2) w Reflex to ID Panel     Status: None (Preliminary result)   Collection Time: 05/06/16 11:54 PM  Result Value Ref Range Status   Specimen Description BLOOD RIGHT HAND  Final   Special Requests   Final    BOTTLES DRAWN AEROBIC AND ANAEROBIC  4CCAERO, 4CCANA   Culture NO GROWTH 1 DAY  Final   Report Status PENDING  Incomplete  MRSA PCR Screening     Status: None   Collection Time: 05/07/16  7:02 AM  Result Value Ref Range Status   MRSA by PCR NEGATIVE NEGATIVE Final    Comment:        The GeneXpert MRSA Assay (FDA approved for NASAL specimens only), is one component of  a comprehensive MRSA colonization surveillance program. It is not intended to diagnose MRSA infection nor to guide or monitor treatment for MRSA infections.      Studies: Dg Chest Port 1 View  05/07/2016  CLINICAL DATA:  Sepsis.  Hypertension. EXAM: PORTABLE CHEST 1 VIEW COMPARISON:  04/19/2016, 07/06/2015. FINDINGS: Unchanged left diaphragmatic hernia. The lungs are clear. Normal pulmonary vasculature. No large effusions. Hilar and mediastinal contours are unremarkable and unchanged. Severe chronic changes about the shoulders, stable. IMPRESSION: No acute cardiopulmonary findings. Electronically Signed   By: Andreas Newport M.D.   On: 05/07/2016 00:40    Scheduled Meds: . sodium chloride   Intravenous Once  . ALPRAZolam  0.25 mg Oral BID  . artificial tears   Both Eyes Q8H  . DULoxetine  60 mg Oral Daily  . feeding supplement (ENSURE ENLIVE)  237 mL Oral TID BM  . fluticasone  2 spray Each Nare BID  . folic acid  1 mg Oral Daily  . HYDROcodone-acetaminophen  2 tablet Oral BID  . meropenem (MERREM) IV  1 g Intravenous Q12H  . metoprolol succinate  50 mg Oral BH-q7a  . pantoprazole  40 mg Oral Daily  . primidone  50 mg Oral BID  . QUEtiapine  25 mg Oral QHS  . senna-docusate  1 tablet Oral QHS   Continuous Infusions: . sodium chloride 30 mL/hr at 05/08/16 1008    Assessment/Plan:  1. Sepsis present on admission. Still awaiting urine culture results. Patient on aggressive antibiotics with meropenem secondary to previous history of ESBL in the urine. 2. Anemia likely of chronic disease. Hemoglobin dropped down to 6.2 with IV fluid hydration. Transfuse 1 unit of packed red blood cells today. Consent from patient and from niece. 3. Relative hypotension hold metoprolol 4. Hyponatremia. Improving with IV fluids 5. Dehydration continue IV fluids 6. Palliative care consultation to discuss goals of care. Patient already a DO NOT RESUSCITATE. I spoke with niece about this. As per  niece the patient does not drink a lot as outpatient.  Code Status:     Code Status Orders        Start     Ordered   05/07/16 0645  Do not attempt resuscitation (DNR)   Continuous    Question Answer Comment  In the event of cardiac or respiratory ARREST Do not call a "code blue"   In the event of cardiac or respiratory ARREST Do not perform Intubation, CPR, defibrillation or ACLS   In the event of cardiac or respiratory ARREST Use medication by any route, position, wound care, and other measures to relive  pain and suffering. May use oxygen, suction and manual treatment of airway obstruction as needed for comfort.      05/07/16 0644    Code Status History    Date Active Date Inactive Code Status Order ID Comments User Context   04/19/2016  6:34 PM 04/22/2016  8:28 PM DNR [id number]  Vaughan Basta, MD Inpatient   03/30/2016 11:59 AM 04/01/2016  8:59 PM DNR [id number]  Hillary Bow, MD ED   07/06/2015 10:04 PM 07/08/2015  7:05 PM DNR [id number]  Lytle Butte, MD ED   04/01/2015 10:08 PM 04/05/2015  5:54 PM DNR [id number]  Lytle Butte, MD ED    Advance Directive Documentation        Most Recent Value   Type of Advance Directive  Out of facility DNR (pink MOST or yellow form)   Pre-existing out of facility DNR order (yellow form or pink MOST form)  Physician notified to receive inpatient order, Yellow form placed in chart (order not valid for inpatient use)   "MOST" Form in Place?       Family Communication: Niece on the phone Disposition Plan: Potential disposition after urine culture reported with sensitivities  Consultants:  Palliative care  Antibiotics:  Meropenem  Time spent: 25 minutes  [place], [place]

## 2016-05-08 NOTE — Care Management (Signed)
Barrier to discharge- hemoglobin 6.2 and required transfusion.  Continue to anticipate return to [facility] under long term care .

## 2016-05-08 NOTE — Progress Notes (Signed)
Sent dr. Leslye Peer a page to make aware patients hgb dropped from 8.1 to 6.2. Waiting for orders.

## 2016-05-09 LAB — CBC
HEMATOCRIT: 24.4 % — AB (ref 35.0–47.0)
HEMOGLOBIN: 8.4 g/dL — AB (ref 12.0–16.0)
MCH: 33.4 pg (ref 26.0–34.0)
MCHC: 34.4 g/dL (ref 32.0–36.0)
MCV: 97 fL (ref 80.0–100.0)
Platelets: 271 10*3/uL (ref 150–440)
RBC: 2.52 MIL/uL — ABNORMAL LOW (ref 3.80–5.20)
RDW: 17.6 % — ABNORMAL HIGH (ref 11.5–14.5)
WBC: 10.1 10*3/uL (ref 3.6–11.0)

## 2016-05-09 LAB — BASIC METABOLIC PANEL
Anion gap: 6 (ref 5–15)
BUN: 27 mg/dL — ABNORMAL HIGH (ref 6–20)
CHLORIDE: 106 mmol/L (ref 101–111)
CO2: 24 mmol/L (ref 22–32)
CREATININE: 0.34 mg/dL — AB (ref 0.44–1.00)
Calcium: 7.7 mg/dL — ABNORMAL LOW (ref 8.9–10.3)
GFR calc non Af Amer: >60 mL/min (ref >60)
Glucose, Bld: 88 mg/dL (ref 65–99)
Potassium: 2.7 mmol/L — CL (ref 3.5–5.1)
Sodium: 136 mmol/L (ref 135–145)

## 2016-05-09 LAB — TYPE AND SCREEN
ABO/RH(D): O POS
ANTIBODY SCREEN: NEGATIVE
UNIT DIVISION: 0

## 2016-05-09 LAB — URINE CULTURE: Culture: >=100000 — AB

## 2016-05-09 MED ORDER — CEPHALEXIN 500 MG PO CAPS: 500.0000 mg | ORAL_CAPSULE | Freq: Three times a day (TID) | ORAL | Status: AC

## 2016-05-09 MED ORDER — DILTIAZEM HCL ER COATED BEADS 120 MG PO CP24
120.0000 mg | ORAL_CAPSULE | Freq: Every day | ORAL | Status: DC
  Administered 2016-05-09: 120 mg via ORAL
  Filled 2016-05-09: qty 1

## 2016-05-09 MED ORDER — MAGNESIUM SULFATE 2 GM/50ML IV SOLN
2.0000 g | Freq: Once | INTRAVENOUS | Status: AC
  Administered 2016-05-09: 2 g via INTRAVENOUS
  Filled 2016-05-09: qty 50

## 2016-05-09 MED ORDER — ALPRAZOLAM 0.25 MG PO TABS: 0.2500 mg | ORAL_TABLET | ORAL | Status: AC

## 2016-05-09 MED ORDER — DILTIAZEM HCL ER COATED BEADS 120 MG PO CP24: 120.0000 mg | ORAL_CAPSULE | Freq: Every day | ORAL | Status: AC

## 2016-05-09 MED ORDER — QUETIAPINE FUMARATE 25 MG PO TABS: 25.0000 mg | ORAL_TABLET | Freq: Every day | ORAL | Status: AC

## 2016-05-09 MED ORDER — DEXTROSE 5 % IV SOLN
1.0000 g | INTRAVENOUS | Status: DC
  Administered 2016-05-09 – 2016-05-10 (×2): 1 g via INTRAVENOUS
  Filled 2016-05-09 (×3): qty 10

## 2016-05-09 MED ORDER — POTASSIUM CHLORIDE CRYS ER 20 MEQ PO TBCR
40.0000 meq | EXTENDED_RELEASE_TABLET | Freq: Once | ORAL | Status: AC
  Administered 2016-05-09: 40 meq via ORAL
  Filled 2016-05-09: qty 2

## 2016-05-09 MED ORDER — POTASSIUM CHLORIDE 10 MEQ/100ML IV SOLN
10.0000 meq | INTRAVENOUS | Status: AC
  Administered 2016-05-09 (×4): 10 meq via INTRAVENOUS
  Filled 2016-05-09 (×4): qty 100

## 2016-05-09 MED ORDER — HYDROCODONE-ACETAMINOPHEN 5-325 MG PO TABS: 2.0000 | ORAL_TABLET | Freq: Two times a day (BID) | ORAL | Status: AC

## 2016-05-09 NOTE — Progress Notes (Addendum)
CSW was informed by MD Leslye Peer that patient would discharge over the weekend. CSW requested MD to complete discharge summary. CSW faxed discharge summary via [place]. Attempted to call Marilynn Rail (patient's niece) she did not answer and her voicemail box was full. CSW will continue to follow and assist.  Ernest Pine, MSW, [place], [place] Social Worker [phone]

## 2016-05-09 NOTE — Progress Notes (Signed)
Notified Dr. Diamond of K level of 2.7.   

## 2016-05-09 NOTE — Progress Notes (Signed)
Pharmacy Antibiotic Note  Stacey Torres is a 82 y.o. female admitted on 05/06/2016 with urosepsis.  Patient on vancomycin and zosyn, discussed with attending MD. Suspect urinary source, patient with recent ESBL e. Coli UTI in May, will discontinue vancomycin and change zosyn to meropenem for now, deescalate based on cultures.   Plan: Meropenem 1gm IV Q12H (renal dose adjustment of 1gm Q8H)  6/30: Urine cx: Proteus mirabilis, sens to Unasyn , Ceftriaxone, cefazolin, gent, imipenem, Zosyn.   Per MD abx changed to Ceftriaxone  Height: 5\' 4"  (162.6 cm) Weight: 99 lb 3.2 oz (44.997 kg) IBW/kg (Calculated) : 54.7  Temp (24hrs), Avg:97.8 F (36.6 C), Min:97.5 F (36.4 C), Max:98.1 F (36.7 C)   Recent Labs Lab 05/06/16 2011 05/06/16 2354 05/07/16 0542 05/07/16 0945 05/08/16 0422 05/09/16 0426  WBC 19.1*  --   --  19.1* 12.0* 10.1  CREATININE 0.75  --   --  0.69 0.41* 0.34*  LATICACIDVEN  --  1.2 0.9 1.4  --   --     Estimated Creatinine Clearance: 37.2 mL/min (by C-G formula based on Cr of 0.34).    Allergies  Allergen Reactions  . Aspirin Other (See Comments)    Gi bleeding  . Macrobid [Nitrofurantoin] Other (See Comments)    Reaction:  Unknown     Antimicrobials this admission: Ceftriaxone x 1 6/28 Vancomycin x 1 6/28 Zosyn x 1 6/28 Meropenem 6/28 >> 6/30 CTX 6/30  Dose adjustments this admission:   Microbiology results: 6/28 BCx: pending 6/28 Urine: PROTEUS MIRABILIS  6/27 CXR: no acute disease 6/27 UA: LE(+) NO2(-) WBC TNTC 6/26 MRSA PCR: negative  Thank you for allowing pharmacy to be a part of this patient's care.  Thales Knipple A 05/09/2016 10:25 AM

## 2016-05-09 NOTE — Discharge Summary (Addendum)
[facility] NAME: Stacey Torres    MR#:  [id number]  DATE OF BIRTH:  November 23, 1928  DATE OF ADMISSION:  05/06/2016 ADMITTING PHYSICIAN: Saundra Shelling, MD  DATE OF DISCHARGE:  05/10/2016  PRIMARY CARE PHYSICIAN: Viviana Simpler, MD    ADMISSION DIAGNOSIS:  Dehydration [E86.0] Sepsis, due to unspecified organism ([place]) [A41.9]  DISCHARGE DIAGNOSIS:  Active Problems:   Sepsis ([place])   SECONDARY DIAGNOSIS:   Past Medical History  Diagnosis Date  . Cerebral palsy, hemiplegic ([place])     pt is intelligant  . Neurologic gait dysfunction   . Spinal stenosis   . Urge urinary incontinence   . Hypertension   . Arthritis   . Neurogenic bladder   . Hydronephrosis, right   . Chronic cystitis   . Gross hematuria   . RA (rheumatoid arthritis) ([facility])     advanced  . Flexion contracture joint of multiple sites     secondary to advanced ra  . Slurred speech   . Cancer ([place]) skin ca  . Seasonal allergies   . UTI (lower urinary tract infection)   . Sepsis due to urinary tract infection ([place])   . Cough   . RA (rheumatoid arthritis) ([place])   . Edema, lower extremity   . Cataract   . History of ESBL E. coli infection     HOSPITAL COURSE:   1. Clinical sepsis present on admission. Urinary tract infection with Proteus. Initially patient was on meropenem and this was switched over to Rocephin. Can go on Keflex upon discharge home for completion of course. 2. Anemia of chronic disease. Hemoglobin responded to one unit of packed red blood cells. Stop IV fluid hydration. 3. SVT, NSVT and relative hypotension. Continue metoprolol and added Cardizem CD at night 4. Hyponatremia. Improved with IV fluid hydration 5. Dehydration. Patient was given IV fluids during the hospital course 6. Failure to thrive. Follow up with Palliative care Team at [facility] for goals of care. 7. Hypokalemia replaced yesterday. Oral magnesium and potassium upon  discharge. 8. Dysphagia 1 diet with nectar thick liquids  DISCHARGE CONDITIONS:   Fair  CONSULTS OBTAINED:  None  DRUG ALLERGIES:   Allergies  Allergen Reactions  . Aspirin Other (See Comments)    Gi bleeding  . Macrobid [Nitrofurantoin] Other (See Comments)    Reaction:  Unknown     DISCHARGE MEDICATIONS:   Current Discharge Medication List    START taking these medications   Details  diltiazem (CARDIZEM CD) 120 MG 24 hr capsule Take 1 capsule (120 mg total) by mouth at bedtime. Qty: 30 capsule, Refills: 0    magnesium oxide (MAG-OX) 400 (241.3 Mg) MG tablet Take 1 tablet (400 mg total) by mouth daily. Qty: 30 tablet, Refills: 0    QUEtiapine (SEROQUEL) 25 MG tablet Take 1 tablet (25 mg total) by mouth at bedtime. Qty: 30 tablet, Refills: 0      CONTINUE these medications which have CHANGED   Details  ALPRAZolam (XANAX) 0.25 MG tablet Take 1 tablet (0.25 mg total) by mouth See admin instructions. Take 0.25 mg by mouth at 4 pm and 8 pm Qty: 10 tablet, Refills: 0    cephALEXin (KEFLEX) 500 MG capsule Take 1 capsule (500 mg total) by mouth 3 (three) times daily. Qty: 15 capsule, Refills: 0    HYDROcodone-acetaminophen (NORCO/VICODIN) 5-325 MG tablet Take 2 tablets by mouth 2 (two) times daily. Take at 4 pm and 8 pm. Qty: 30  tablet, Refills: 0      CONTINUE these medications which have NOT CHANGED   Details  Ascorbic Acid (VITAMIN C) 1000 MG tablet Take 1,000 mg by mouth daily. Reported on 05/06/2016    DULoxetine (CYMBALTA) 60 MG capsule Take 60 mg by mouth daily.     feeding supplement, ENSURE ENLIVE, (ENSURE ENLIVE) LIQD Take 237 mLs by mouth 2 (two) times daily between meals. Qty: 237 mL, Refills: 12    fluticasone (FLONASE) 50 MCG/ACT nasal spray Place 2 sprays into both nostrils 2 (two) times daily.    folic acid (FOLVITE) 1 MG tablet Take 1 mg by mouth daily.    hydroxychloroquine (PLAQUENIL) 200 MG tablet Take 200 mg by mouth 2 (two) times daily.     Hypromellose (ARTIFICIAL TEARS OP) Apply 2 drops to eye 2 (two) times daily as needed (for dry eyes).    loratadine (CLARITIN) 10 MG tablet Take 10 mg by mouth daily.     metoprolol succinate (TOPROL-XL) 50 MG 24 hr tablet Take 50 mg by mouth every morning. *Hold if B/P is < than 110/60.)    montelukast (SINGULAIR) 10 MG tablet Take 10 mg by mouth every evening.    Multiple Vitamin (THEREMS) TABS Take 1 tablet by mouth daily.    omeprazole (PRILOSEC) 20 MG capsule Take 1 capsule (20 mg total) by mouth daily. Qty: 60 capsule, Refills: 0    ondansetron (ZOFRAN) 4 MG tablet Take 4 mg by mouth every 4 (four) hours as needed for nausea or vomiting.     polyethylene glycol (MIRALAX / GLYCOLAX) packet Take 17 g by mouth daily as needed for mild constipation or moderate constipation.     potassium chloride SA (K-DUR,KLOR-CON) 20 MEQ tablet Take 1 tablet (20 mEq total) by mouth daily.    primidone (MYSOLINE) 50 MG tablet Take 50 mg by mouth 2 (two) times daily.     promethazine (PHENERGAN) 12.5 MG suppository Place 12.5 mg rectally every 6 (six) hours as needed for nausea or vomiting.    sennosides-docusate sodium (SENOKOT-S) 8.6-50 MG tablet Take 1 tablet by mouth at bedtime.      STOP taking these medications     acetaminophen (TYLENOL) 650 MG CR tablet      mirtazapine (REMERON) 30 MG tablet      traZODone (DESYREL) 50 MG tablet          DISCHARGE INSTRUCTIONS:   Follow-up doctor at rehabilitation one day Follow-up with palliative care at facility  If you experience worsening of your admission symptoms, develop shortness of breath, life threatening emergency, suicidal or homicidal thoughts you must seek medical attention immediately by calling 911 or calling your MD immediately  if symptoms less severe.  You Must read complete instructions/literature along with all the possible adverse reactions/side effects for all the Medicines you take and that have been prescribed to you.  Take any new Medicines after you have completely understood and accept all the possible adverse reactions/side effects.   Please note  You were cared for by a hospitalist during your hospital stay. If you have any questions about your discharge medications or the care you received while you were in the hospital after you are discharged, you can call the unit and asked to speak with the hospitalist on call if the hospitalist that took care of you is not available. Once you are discharged, your primary care physician will handle any further medical issues. Please note that NO REFILLS for any discharge medications will be  authorized once you are discharged, as it is imperative that you return to your primary care physician (or establish a relationship with a primary care physician if you do not have one) for your aftercare needs so that they can reassess your need for medications and monitor your lab values.    Today   CHIEF COMPLAINT:   Chief Complaint  Patient presents with  . Emesis    HISTORY OF PRESENT ILLNESS:  Stacey Torres  is a 82 y.o. female presented with vomiting   VITAL SIGNS:  Blood pressure 120/85, pulse 85, temperature 98.7 F (37.1 C), temperature source Oral, resp. rate 16, height 5\' 4"  (1.626 m), weight 45.45 kg (100 lb 3.2 oz), SpO2 92 %.    PHYSICAL EXAMINATION:  GENERAL:  82 y.o.-year-old patient lying in the bed with no acute distress.  EYES: Pupils equal, round, reactive to light and accommodation. No scleral icterus. Extraocular muscles intact.  HEENT: Head atraumatic, normocephalic. Oropharynx and nasopharynx clear.  NECK:  Supple, no jugular venous distention. No thyroid enlargement, no tenderness.  LUNGS: Normal breath sounds bilaterally, no wheezing, rales,rhonchi or crepitation. No use of accessory muscles of respiration.  CARDIOVASCULAR: S1, S2 normal. No murmurs, rubs, or gallops.  ABDOMEN: Soft, non-tender, non-distended. Bowel sounds present. No  organomegaly or mass.  EXTREMITIES: No edema, cyanosis, or clubbing.  NEUROLOGIC: Cranial nerves II through XII are intact. Muscle strength 5/5 in all extremities. Sensation intact. Gait not checked.  PSYCHIATRIC: The patient is alert and oriented x 3.  SKIN: No obvious rash, lesion, or ulcer.   DATA REVIEW:   CBC  Recent Labs Lab 05/09/16 0426 05/10/16 0529  WBC 10.1  --   HGB 8.4* 9.4*  HCT 24.4*  --   PLT 271  --     Chemistries   Recent Labs Lab 05/07/16 0945  05/10/16 0529  NA 129*  < > 135  K 5.1  < > 3.8  CL 101  < > 107  CO2 19*  < > 21*  GLUCOSE 126*  < > 75  BUN 62*  < > 18  CREATININE 0.69  < > 0.35*  CALCIUM 8.8*  < > 7.8*  MG  --   --  2.0  AST 17  --   --   ALT 6*  --   --   ALKPHOS 72  --   --   BILITOT 0.5  --   --   < > = values in this interval not displayed.   Microbiology Results  Results for orders placed or performed during the hospital encounter of 05/06/16  Urine culture     Status: Abnormal   Collection Time: 05/06/16 11:31 PM  Result Value Ref Range Status   Specimen Description URINE, RANDOM  Final   Special Requests NONE  Final   Culture >=100,000 COLONIES/mL PROTEUS MIRABILIS (A)  Final   Report Status 05/09/2016 FINAL  Final   Organism ID, Bacteria PROTEUS MIRABILIS (A)  Final      Susceptibility   Proteus mirabilis - MIC*    AMPICILLIN >=32 RESISTANT Resistant     CEFAZOLIN 8 SENSITIVE Sensitive     CEFTRIAXONE <=1 SENSITIVE Sensitive     CIPROFLOXACIN >=4 RESISTANT Resistant     GENTAMICIN <=1 SENSITIVE Sensitive     IMIPENEM 2 SENSITIVE Sensitive     NITROFURANTOIN 128 RESISTANT Resistant     TRIMETH/SULFA >=320 RESISTANT Resistant     AMPICILLIN/SULBACTAM 4 SENSITIVE Sensitive     PIP/TAZO <=4  SENSITIVE Sensitive     * >=100,000 COLONIES/mL PROTEUS MIRABILIS  Culture, blood (Routine X 2) w Reflex to ID Panel     Status: None (Preliminary result)   Collection Time: 05/06/16 11:50 PM  Result Value Ref Range Status    Specimen Description BLOOD LEFT THUMB  Final   Special Requests   Final    BOTTLES DRAWN AEROBIC AND ANAEROBIC  2CCAERO, 3CCANA   Culture NO GROWTH 3 DAYS  Final   Report Status PENDING  Incomplete  Culture, blood (Routine X 2) w Reflex to ID Panel     Status: None (Preliminary result)   Collection Time: 05/06/16 11:54 PM  Result Value Ref Range Status   Specimen Description BLOOD RIGHT HAND  Final   Special Requests   Final    BOTTLES DRAWN AEROBIC AND ANAEROBIC  4CCAERO, 4CCANA   Culture NO GROWTH 3 DAYS  Final   Report Status PENDING  Incomplete  MRSA PCR Screening     Status: None   Collection Time: 05/07/16  7:02 AM  Result Value Ref Range Status   MRSA by PCR NEGATIVE NEGATIVE Final    Comment:        The GeneXpert MRSA Assay (FDA approved for NASAL specimens only), is one component of a comprehensive MRSA colonization surveillance program. It is not intended to diagnose MRSA infection nor to guide or monitor treatment for MRSA infections.     Management plans discussed with the patient, family and they are in agreement.  CODE STATUS:     Code Status Orders        Start     Ordered   05/07/16 0645  Do not attempt resuscitation (DNR)   Continuous    Question Answer Comment  In the event of cardiac or respiratory ARREST Do not call a "code blue"   In the event of cardiac or respiratory ARREST Do not perform Intubation, CPR, defibrillation or ACLS   In the event of cardiac or respiratory ARREST Use medication by any route, position, wound care, and other measures to relive pain and suffering. May use oxygen, suction and manual treatment of airway obstruction as needed for comfort.      05/07/16 0644    Code Status History    Date Active Date Inactive Code Status Order ID Comments User Context   04/19/2016  6:34 PM 04/22/2016  8:28 PM DNR [id number]  Vaughan Basta, MD Inpatient   03/30/2016 11:59 AM 04/01/2016  8:59 PM DNR [id number]  Hillary Bow, MD ED    07/06/2015 10:04 PM 07/08/2015  7:05 PM DNR [id number]  Lytle Butte, MD ED   04/01/2015 10:08 PM 04/05/2015  5:54 PM DNR [id number]  Lytle Butte, MD ED    Advance Directive Documentation        Most Recent Value   Type of Advance Directive  Out of facility DNR (pink MOST or yellow form)   Pre-existing out of facility DNR order (yellow form or pink MOST form)  Physician notified to receive inpatient order, Yellow form placed in chart (order not valid for inpatient use)   "MOST" Form in Place?        TOTAL TIME TAKING CARE OF THIS PATIENT: 35 minutes.    Loletha Grayer M.D on 05/10/2016 at 8:29 AM  Between 7am to 6pm - Pager - [phone]  After 6pm go to [email or web address] - password [email or web address]  [facility] Office  [phone]  CC: Primary care physician; Viviana Simpler, MD

## 2016-05-09 NOTE — Progress Notes (Signed)
Patient ID: Stacey Torres, female   DOB: June 08, 1929, 82 y.o.   MRN: [id number] Stacey Torres PROGRESS NOTE  Stacey Torres [id number] DOB: 06/29/1929 DOA: 05/06/2016 PCP: Viviana Simpler, MD  HPI/Subjective: Patient feels okay and offers no complaints.  Objective: Filed Vitals:   05/09/16 0359 05/09/16 1203  BP: 105/54 90/49  Pulse: 96 79  Temp: 98.1 F (36.7 C) 97.7 F (36.5 C)  Resp: 18 20    Filed Weights   05/07/16 0645 05/08/16 0630 05/09/16 0359  Weight: 42.185 kg (93 lb) 43.954 kg (96 lb 14.4 oz) 44.997 kg (99 lb 3.2 oz)    ROS: Review of Systems  Constitutional: Negative for fever and chills.  Eyes: Negative for blurred vision.  Respiratory: Negative for cough and shortness of breath.   Cardiovascular: Negative for chest pain.  Gastrointestinal: Positive for constipation. Negative for nausea, vomiting, abdominal pain and diarrhea.  Genitourinary: Negative for dysuria.  Musculoskeletal: Negative for joint pain.  Neurological: Negative for dizziness and headaches.   Exam: Physical Exam  HENT:  Nose: No mucosal edema.  Mouth/Throat: No oropharyngeal exudate or posterior oropharyngeal edema.  Eyes: EOM and lids are normal. Pupils are equal, round, and reactive to light.  Neck: No JVD present. Carotid bruit is not present. No edema present. No thyroid mass and no thyromegaly present.  Cardiovascular: S1 normal and S2 normal.  Exam reveals no gallop.   No murmur heard. Pulses:      Dorsalis pedis pulses are 2+ on the right side, and 2+ on the left side.  Respiratory: No respiratory distress. She has no wheezes. She has no rhonchi. She has no rales.  GI: Soft. Bowel sounds are normal. There is no tenderness.  Musculoskeletal:       Right ankle: She exhibits swelling.       Left ankle: She exhibits swelling.  Lymphadenopathy:    She has no cervical adenopathy.  Neurological: She is alert.  Skin: Skin is warm. No rash noted. Nails show no clubbing.   No decubiti seen on buttock or heels. She does have a scab on her toe.  Psychiatric: She has a normal mood and affect.      Data Reviewed: Basic Metabolic Panel:  Recent Labs Lab 05/06/16 2011 05/07/16 0945 05/08/16 0422 05/09/16 0426  NA 128* 129* 133* 136  K 6.1* 5.1 3.2* 2.7*  CL 100* 101 108 106  CO2 17* 19* 19* 24  GLUCOSE 104* 126* 94 88  BUN 65* 62* 40* 27*  CREATININE 0.75 0.69 0.41* 0.34*  CALCIUM 8.9 8.8* 7.7* 7.7*   Liver Function Tests:  Recent Labs Lab 05/06/16 2011 05/07/16 0945  AST 14* 17  ALT 6* 6*  ALKPHOS 87 72  BILITOT 0.1* 0.5  PROT 6.7 5.9*  ALBUMIN 2.6* 2.2*   CBC:  Recent Labs Lab 05/06/16 2011 05/07/16 0945 05/08/16 0422 05/09/16 0426  WBC 19.1* 19.1* 12.0* 10.1  NEUTROABS  --  16.7*  --   --   HGB 9.9* 8.1* 6.2* 8.4*  HCT 29.0* 23.5* 18.7* 24.4*  MCV 101.5* 98.3 99.4 97.0  PLT 333 305 249 271     Recent Results (from the past 240 hour(s))  Urine culture     Status: Abnormal   Collection Time: 05/06/16 11:31 PM  Result Value Ref Range Status   Specimen Description URINE, RANDOM  Final   Special Requests NONE  Final   Culture >=100,000 COLONIES/mL PROTEUS MIRABILIS (A)  Final   Report Status 05/09/2016 FINAL  Final  Organism ID, Bacteria PROTEUS MIRABILIS (A)  Final      Susceptibility   Proteus mirabilis - MIC*    AMPICILLIN >=32 RESISTANT Resistant     CEFAZOLIN 8 SENSITIVE Sensitive     CEFTRIAXONE <=1 SENSITIVE Sensitive     CIPROFLOXACIN >=4 RESISTANT Resistant     GENTAMICIN <=1 SENSITIVE Sensitive     IMIPENEM 2 SENSITIVE Sensitive     NITROFURANTOIN 128 RESISTANT Resistant     TRIMETH/SULFA >=320 RESISTANT Resistant     AMPICILLIN/SULBACTAM 4 SENSITIVE Sensitive     PIP/TAZO <=4 SENSITIVE Sensitive     * >=100,000 COLONIES/mL PROTEUS MIRABILIS  Culture, blood (Routine X 2) w Reflex to ID Panel     Status: None (Preliminary result)   Collection Time: 05/06/16 11:50 PM  Result Value Ref Range Status    Specimen Description BLOOD LEFT THUMB  Final   Special Requests   Final    BOTTLES DRAWN AEROBIC AND ANAEROBIC  2CCAERO, 3CCANA   Culture NO GROWTH 2 DAYS  Final   Report Status PENDING  Incomplete  Culture, blood (Routine X 2) w Reflex to ID Panel     Status: None (Preliminary result)   Collection Time: 05/06/16 11:54 PM  Result Value Ref Range Status   Specimen Description BLOOD RIGHT HAND  Final   Special Requests   Final    BOTTLES DRAWN AEROBIC AND ANAEROBIC  4CCAERO, 4CCANA   Culture NO GROWTH 2 DAYS  Final   Report Status PENDING  Incomplete  MRSA PCR Screening     Status: None   Collection Time: 05/07/16  7:02 AM  Result Value Ref Range Status   MRSA by PCR NEGATIVE NEGATIVE Final    Comment:        The GeneXpert MRSA Assay (FDA approved for NASAL specimens only), is one component of a comprehensive MRSA colonization surveillance program. It is not intended to diagnose MRSA infection nor to guide or monitor treatment for MRSA infections.       Scheduled Meds: . sodium chloride   Intravenous Once  . ALPRAZolam  0.25 mg Oral BID  . artificial tears   Both Eyes Q8H  . cefTRIAXone (ROCEPHIN)  IV  1 g Intravenous Q24H  . diltiazem  120 mg Oral QHS  . DULoxetine  60 mg Oral Daily  . feeding supplement (ENSURE ENLIVE)  237 mL Oral TID BM  . fluticasone  2 spray Each Nare BID  . folic acid  1 mg Oral Daily  . HYDROcodone-acetaminophen  2 tablet Oral BID  . metoprolol succinate  50 mg Oral BH-q7a  . pantoprazole  40 mg Oral Daily  . potassium chloride  10 mEq Intravenous Q1 Hr x 4  . primidone  50 mg Oral BID  . QUEtiapine  25 mg Oral QHS  . senna-docusate  1 tablet Oral QHS    Assessment/Plan:  1. Sepsis present on admission. Urinary tract infection with Proteus. Switch antibiotics to Rocephin. Likely Keflex upon discharge. 2. Anemia likely of chronic disease. Hemoglobin responded to transfusion. Stop IV fluid hydration 3. SVT and relative hypotension. Continue   metoprolol and add Cardizem CD at night  4. Hyponatremia. improved  5. Dehydration stop the IV fluids  6. We don't have palliative care consultation today. Potentially they can do that at [facility].  7.  hypokalemia. Replace magnesium and potassium. Check electrolytes tomorrow.  Code Status:     Code Status Orders        Start  Ordered   05/07/16 0645  Do not attempt resuscitation (DNR)   Continuous    Question Answer Comment  In the event of cardiac or respiratory ARREST Do not call a "code blue"   In the event of cardiac or respiratory ARREST Do not perform Intubation, CPR, defibrillation or ACLS   In the event of cardiac or respiratory ARREST Use medication by any route, position, wound care, and other measures to relive pain and suffering. May use oxygen, suction and manual treatment of airway obstruction as needed for comfort.      05/07/16 0644    Code Status History    Date Active Date Inactive Code Status Order ID Comments User Context   04/19/2016  6:34 PM 04/22/2016  8:28 PM DNR [id number]  Vaughan Basta, MD Inpatient   03/30/2016 11:59 AM 04/01/2016  8:59 PM DNR [id number]  Hillary Bow, MD ED   07/06/2015 10:04 PM 07/08/2015  7:05 PM DNR [id number]  Lytle Butte, MD ED   04/01/2015 10:08 PM 04/05/2015  5:54 PM DNR [id number]  Lytle Butte, MD ED    Advance Directive Documentation        Most Recent Value   Type of Advance Directive  Out of facility DNR ([facility] MOST or yellow form)   Pre-existing out of facility DNR order (yellow form or [facility] MOST form)  Physician notified to receive inpatient order, Yellow form placed in chart (order not valid for inpatient use)   "MOST" Form in Place?       Family Communication: Spoke with niece yesterday. I was unable to leave a message on her cell phone Disposition Plan: eventually back to [facility] [place] over the weekend   Antibiotics:  Rocephin   Time spent: 25 minutes  [place], [place]

## 2016-05-09 NOTE — Progress Notes (Deleted)
Clinical Social Worker was informed that patient will be medically ready to discharge to [facility]. Patient and her family are in a agreement with plan. CSW called Tiffany - Admissions Coordinator at [facility] to confirm that patient's bed is ready. Reviewed patient and approved her to return. All discharge information faxed to [facility] via [place]. Call to patient's sister-Betty, informed her patient would discharge to [facility]. Per Inez Catalina they'll be to [facility] at 2:30 PM to transport patient back to [facility].RN informed of above and patient will discharge to [facility] via family transport.  Ernest Pine, MSW, [place], [place] Clinical Social Worker [phone]

## 2016-05-09 NOTE — Care Management Important Message (Signed)
Important Message  Patient Details  Name: Stacey Torres MRN: [id number] Date of Birth: Mar 12, 1929   Medicare Important Message Given:  Yes    Jolly Mango, RN 05/09/2016, 9:49 AM

## 2016-05-09 NOTE — Progress Notes (Signed)
Speech Therapy Note: received order, reviewed chart notes. NSG staff reported coughing w/ thin liquids. Pt's diet consistency was modified until BSE could be completed. Recommended medications be given in a Puree for safer swallowing. Recommended aspiration precautions as well. ST services will f/u in morning.

## 2016-05-10 LAB — BASIC METABOLIC PANEL
Anion gap: 7 (ref 5–15)
BUN: 18 mg/dL (ref 6–20)
CALCIUM: 7.8 mg/dL — AB (ref 8.9–10.3)
CO2: 21 mmol/L — AB (ref 22–32)
CREATININE: 0.35 mg/dL — AB (ref 0.44–1.00)
Chloride: 107 mmol/L (ref 101–111)
GFR calc non Af Amer: >60 mL/min (ref >60)
GLUCOSE: 75 mg/dL (ref 65–99)
Potassium: 3.8 mmol/L (ref 3.5–5.1)
Sodium: 135 mmol/L (ref 135–145)

## 2016-05-10 LAB — HEMOGLOBIN: HEMOGLOBIN: 9.4 g/dL — AB (ref 12.0–16.0)

## 2016-05-10 LAB — MAGNESIUM: Magnesium: 2 mg/dL (ref 1.7–2.4)

## 2016-05-10 MED ORDER — MAGNESIUM OXIDE 400 (241.3 MG) MG PO TABS
400.0000 mg | ORAL_TABLET | Freq: Every day | ORAL | Status: DC
  Administered 2016-05-10: 400 mg via ORAL
  Filled 2016-05-10: qty 1

## 2016-05-10 MED ORDER — MAGNESIUM OXIDE 400 (241.3 MG) MG PO TABS: 400.0000 mg | ORAL_TABLET | Freq: Every day | ORAL | Status: AC

## 2016-05-10 NOTE — Progress Notes (Signed)
Clinical Social Worker was informed that patient will be medically ready to discharge to [facility]. Patient and family are in a agreement with plan. CSW called Seth Bake- Admissions Coordinator at [facility] confirm that patient's bed is ready. Provided patient's room number 325 and number to call for report [phone] . All discharge information faxed to SNF via [place]. Rx's and DNR added to discharge packet. RN will call report and patient will discharge to [facility] via EMS  Ernest Pine, MSW, [place], [place] Social Worker [phone]

## 2016-05-10 NOTE — Progress Notes (Signed)
Patient ID: Nanna Journigan, female   DOB: 01-29-1929, 82 y.o.   MRN: [id number] [facility] PROGRESS NOTE  Draxie Mcduffey [id number] DOB: 02/19/1929 DOA: 05/06/2016 PCP: Viviana Simpler, MD  HPI/Subjective: Patient offers no complaints. Had BM yesterday as per aide. Patient requesting oatmeal for breakfast.  Objective: Filed Vitals:   05/09/16 1944 05/10/16 0521  BP: 118/96 120/85  Pulse: 94 85  Temp: 98.1 F (36.7 C) 98.7 F (37.1 C)  Resp: 16 16    Filed Weights   05/08/16 0630 05/09/16 0359 05/10/16 0521  Weight: 43.954 kg (96 lb 14.4 oz) 44.997 kg (99 lb 3.2 oz) 45.45 kg (100 lb 3.2 oz)    ROS: Review of Systems  Constitutional: Negative for fever and chills.  Eyes: Negative for blurred vision.  Respiratory: Negative for cough and shortness of breath.   Cardiovascular: Negative for chest pain.  Gastrointestinal: Negative for nausea, vomiting, abdominal pain, diarrhea and constipation.  Genitourinary: Negative for dysuria.  Musculoskeletal: Negative for joint pain.  Neurological: Negative for dizziness and headaches.   Exam: Physical Exam  HENT:  Nose: No mucosal edema.  Mouth/Throat: No oropharyngeal exudate or posterior oropharyngeal edema.  Eyes: EOM and lids are normal. Pupils are equal, round, and reactive to light.  Neck: No JVD present. Carotid bruit is not present. No edema present. No thyroid mass and no thyromegaly present.  Cardiovascular: S1 normal and S2 normal.  Exam reveals no gallop.   No murmur heard. Pulses:      Dorsalis pedis pulses are 2+ on the right side, and 2+ on the left side.  Respiratory: No respiratory distress. She has no wheezes. She has no rhonchi. She has no rales.  GI: Soft. Bowel sounds are normal. There is no tenderness.  Musculoskeletal:       Right ankle: She exhibits swelling.       Left ankle: She exhibits swelling.  Lymphadenopathy:    She has no cervical adenopathy.  Neurological: She is alert.  Skin:  Skin is warm. No rash noted. Nails show no clubbing.  No decubiti seen on buttock or heels. She does have a scab on her toe.  Psychiatric: She has a normal mood and affect.      Data Reviewed: Basic Metabolic Panel:  Recent Labs Lab 05/06/16 2011 05/07/16 0945 05/08/16 0422 05/09/16 0426 05/10/16 0529  NA 128* 129* 133* 136 135  K 6.1* 5.1 3.2* 2.7* 3.8  CL 100* 101 108 106 107  CO2 17* 19* 19* 24 21*  GLUCOSE 104* 126* 94 88 75  BUN 65* 62* 40* 27* 18  CREATININE 0.75 0.69 0.41* 0.34* 0.35*  CALCIUM 8.9 8.8* 7.7* 7.7* 7.8*  MG  --   --   --   --  2.0   Liver Function Tests:  Recent Labs Lab 05/06/16 2011 05/07/16 0945  AST 14* 17  ALT 6* 6*  ALKPHOS 87 72  BILITOT 0.1* 0.5  PROT 6.7 5.9*  ALBUMIN 2.6* 2.2*   CBC:  Recent Labs Lab 05/06/16 2011 05/07/16 0945 05/08/16 0422 05/09/16 0426 05/10/16 0529  WBC 19.1* 19.1* 12.0* 10.1  --   NEUTROABS  --  16.7*  --   --   --   HGB 9.9* 8.1* 6.2* 8.4* 9.4*  HCT 29.0* 23.5* 18.7* 24.4*  --   MCV 101.5* 98.3 99.4 97.0  --   PLT 333 305 249 271  --      Recent Results (from the past 240 hour(s))  Urine culture  Status: Abnormal   Collection Time: 05/06/16 11:31 PM  Result Value Ref Range Status   Specimen Description URINE, RANDOM  Final   Special Requests NONE  Final   Culture >=100,000 COLONIES/mL PROTEUS MIRABILIS (A)  Final   Report Status 05/09/2016 FINAL  Final   Organism ID, Bacteria PROTEUS MIRABILIS (A)  Final      Susceptibility   Proteus mirabilis - MIC*    AMPICILLIN >=32 RESISTANT Resistant     CEFAZOLIN 8 SENSITIVE Sensitive     CEFTRIAXONE <=1 SENSITIVE Sensitive     CIPROFLOXACIN >=4 RESISTANT Resistant     GENTAMICIN <=1 SENSITIVE Sensitive     IMIPENEM 2 SENSITIVE Sensitive     NITROFURANTOIN 128 RESISTANT Resistant     TRIMETH/SULFA >=320 RESISTANT Resistant     AMPICILLIN/SULBACTAM 4 SENSITIVE Sensitive     PIP/TAZO <=4 SENSITIVE Sensitive     * >=100,000 COLONIES/mL PROTEUS  MIRABILIS  Culture, blood (Routine X 2) w Reflex to ID Panel     Status: None (Preliminary result)   Collection Time: 05/06/16 11:50 PM  Result Value Ref Range Status   Specimen Description BLOOD LEFT THUMB  Final   Special Requests   Final    BOTTLES DRAWN AEROBIC AND ANAEROBIC  2CCAERO, 3CCANA   Culture NO GROWTH 3 DAYS  Final   Report Status PENDING  Incomplete  Culture, blood (Routine X 2) w Reflex to ID Panel     Status: None (Preliminary result)   Collection Time: 05/06/16 11:54 PM  Result Value Ref Range Status   Specimen Description BLOOD RIGHT HAND  Final   Special Requests   Final    BOTTLES DRAWN AEROBIC AND ANAEROBIC  4CCAERO, 4CCANA   Culture NO GROWTH 3 DAYS  Final   Report Status PENDING  Incomplete  MRSA PCR Screening     Status: None   Collection Time: 05/07/16  7:02 AM  Result Value Ref Range Status   MRSA by PCR NEGATIVE NEGATIVE Final    Comment:        The GeneXpert MRSA Assay (FDA approved for NASAL specimens only), is one component of a comprehensive MRSA colonization surveillance program. It is not intended to diagnose MRSA infection nor to guide or monitor treatment for MRSA infections.       Scheduled Meds: . sodium chloride   Intravenous Once  . ALPRAZolam  0.25 mg Oral BID  . artificial tears   Both Eyes Q8H  . cefTRIAXone (ROCEPHIN)  IV  1 g Intravenous Q24H  . diltiazem  120 mg Oral QHS  . DULoxetine  60 mg Oral Daily  . feeding supplement (ENSURE ENLIVE)  237 mL Oral TID BM  . fluticasone  2 spray Each Nare BID  . folic acid  1 mg Oral Daily  . HYDROcodone-acetaminophen  2 tablet Oral BID  . magnesium oxide  400 mg Oral Daily  . metoprolol succinate  50 mg Oral BH-q7a  . pantoprazole  40 mg Oral Daily  . primidone  50 mg Oral BID  . QUEtiapine  25 mg Oral QHS  . senna-docusate  1 tablet Oral QHS    Assessment/Plan:  1. Sepsis present on admission. Urinary tract infection with Proteus. Switched antibiotics to Rocephin yesterday.  Keflex upon discharge. 2. Anemia likely of chronic disease. Hemoglobin responded to transfusion.  3. SVT, NSVT and relative hypotension. Continue  metoprolol and add Cardizem CD at night  4. Hyponatremia. improved  5. Dehydration improved 6. Palliative care f/u at [facility] for goals of care 7. hypokalemia. Replaced  Code Status:     Code Status Orders        Start     Ordered   05/07/16 0645  Do not attempt resuscitation (DNR)   Continuous    Question Answer Comment  In the event of cardiac or respiratory ARREST Do not call a "code blue"   In the event of cardiac or respiratory ARREST Do not perform Intubation, CPR, defibrillation or ACLS   In the event of cardiac or respiratory ARREST Use medication by any route, position, wound care, and other measures to relive pain and suffering. May use oxygen, suction and manual treatment of airway obstruction as needed for comfort.      05/07/16 0644    Code Status History    Date Active Date Inactive Code Status Order ID Comments User Context   04/19/2016  6:34 PM 04/22/2016  8:28 PM DNR [id number]  Vaughan Basta, MD Inpatient   03/30/2016 11:59 AM 04/01/2016  8:59 PM DNR [id number]  Hillary Bow, MD ED   07/06/2015 10:04 PM 07/08/2015  7:05 PM DNR [id number]  Lytle Butte, MD ED   04/01/2015 10:08 PM 04/05/2015  5:54 PM DNR [id number]  Lytle Butte, MD ED    Advance Directive Documentation        Most Recent Value   Type of Advance Directive  Out of facility DNR (pink MOST or yellow form)   Pre-existing out of facility DNR order (yellow form or pink MOST form)  Physician notified to receive inpatient order, Yellow form placed in chart (order not valid for inpatient use)   "MOST" Form in Place?       Family Communication: Spoke with niece today Disposition Plan:  to [facility] [place] today  Antibiotics:  Rocephin while here, Keflex upon d/c  Time spent: 35 minutes  [place], [place]

## 2016-05-10 NOTE — Evaluation (Signed)
Clinical/Bedside Swallow Evaluation Patient Details  Name: Stacey Torres MRN: [id number] Date of Birth: 21-Aug-1929  Today's Date: 05/10/2016 Time: SLP Start Time (ACUTE ONLY): 1015 SLP Stop Time (ACUTE ONLY): 1040 SLP Time Calculation (min) (ACUTE ONLY): 25 min  Past Medical History:  Past Medical History  Diagnosis Date  . Cerebral palsy, hemiplegic ([place])     pt is intelligant  . Neurologic gait dysfunction   . Spinal stenosis   . Urge urinary incontinence   . Hypertension   . Arthritis   . Neurogenic bladder   . Hydronephrosis, right   . Chronic cystitis   . Gross hematuria   . RA (rheumatoid arthritis) ([facility])     advanced  . Flexion contracture joint of multiple sites     secondary to advanced ra  . Slurred speech   . Cancer ([place]) skin ca  . Seasonal allergies   . UTI (lower urinary tract infection)   . Sepsis due to urinary tract infection ([place])   . Cough   . RA (rheumatoid arthritis) ([place])   . Edema, lower extremity   . Cataract   . History of ESBL E. coli infection    Past Surgical History:  Past Surgical History  Procedure Laterality Date  . Total knee arthroplasty Right 2005  . Cholecystectomy  1995  . Cystoscopy with retrograde pyelogram, ureteroscopy and stent placement Bilateral 11/21/2013    Procedure: CYSTOSCOPY WITH RIGHT RETROGRADE PYELOGRAM, RIGHT FLEXIBLE and rigid URETEROSCOPY  PLACEMENT;  Surgeon: Bernestine Amass, MD;  Location: [facility];  Service: Urology;  Laterality: Bilateral;  . Cystogram  11/21/2013    Procedure: CYSTOGRAM;  Surgeon: Bernestine Amass, MD;  Location: [facility];  Service: Urology;;  . Dg toes rt foot multiple specif ([facility] hx)    . Hysteroscopy w/d&c N/A 05/09/2015    Procedure: DILATATION AND CURETTAGE [place], Myosure;  Surgeon: Gillis Ends, MD;  Location: [facility];  Service: Gynecology;  Laterality: N/A;  . Joint replacement    . Cataract extraction w/phaco Right 09/24/2015   Procedure: CATARACT EXTRACTION PHACO AND INTRAOCULAR LENS PLACEMENT (IOC);  Surgeon: Estill Cotta, MD;  Location: [facility];  Service: Ophthalmology;  Laterality: Right;  [place] 01:53 AP% 26.0 CDE 50.20 fluid pack lot #[id number]   HPI:      Assessment / Plan / Recommendation Clinical Impression  pt presents with a moderate oral pharyngeal dysphagia characterized by coughing and throat clear with thin liquids. pt with no overt ssx aspiration noted with nectar thick liquids or puree trials. pt with delayed oral transit and min holding noted however with min cues pt will initiate swallow. pt recommended to remain on puree with nectar at this time.     Aspiration Risk  Moderate aspiration risk    Diet Recommendation Nectar-thick liquid;Dysphagia 1 (Puree)   Liquid Administration via: Cup;Spoon Medication Administration: Crushed with puree Supervision: Staff to assist with self feeding Compensations: Minimize environmental distractions;Slow rate;Small sips/bites;Follow solids with liquid Postural Changes: Seated upright at 90 degrees    Other  Recommendations Oral Care Recommendations: Oral care BID Other Recommendations: Remove water pitcher   Follow up Recommendations       Frequency and Duration min 2x/week  1 week       Prognosis Prognosis for Safe Diet Advancement: Fair Barriers to Reach Goals: Cognitive deficits      Swallow Study   General Date of Onset: 05/09/16 Type of Study: Bedside Swallow Evaluation Diet Prior to this Study: Dysphagia 1 (puree);Nectar-thick  liquids Temperature Spikes Noted: No Respiratory Status: Room air History of Recent Intubation: No Behavior/Cognition: Alert;Cooperative;Pleasant mood Oral Cavity Assessment: Within Functional Limits Oral Care Completed by SLP: No Oral Cavity - Dentition: Adequate natural dentition Vision: Functional for self-feeding Patient Positioning: Upright in bed    Oral/Motor/Sensory Function Overall Oral  Motor/Sensory Function: Within functional limits   Ice Chips Ice chips: Not tested   Thin Liquid Thin Liquid: Impaired Presentation: Cup Oral Phase Functional Implications: Prolonged oral transit Pharyngeal  Phase Impairments: Throat Clearing - Immediate;Cough - Immediate    Nectar Thick Nectar Thick Liquid: Within functional limits Presentation: Spoon;Cup   Honey Thick Honey Thick Liquid: Not tested   Puree Puree: Within functional limits Presentation: Spoon   Solid   GO   Solid: Not tested        [place] Sauber 05/10/2016,11:24 AM

## 2016-05-10 NOTE — Discharge Instructions (Signed)
[id number] diet with nectar thick liquids   Dehydration, Adult Dehydration means your body does not have as much fluid or water as it needs. It happens when you take in less fluid than you lose. Your kidneys, brain, and heart will not work properly without the right amount of fluids.  Dehydration can range from mild to severe. It should be treated right away to help prevent it from becoming severe. HOME CARE  Drink enough fluid to keep your pee (urine) clear or pale yellow.  Drink water or fluid slowly by taking small sips. You can also try sucking on ice cubes.  Have food or drinks that contain electrolytes. Examples include bananas and sports drinks.  Take over-the-counter and prescription medicines only as told by your doctor.  Prepare oral rehydration solution (ORS) according to the instructions that came with it. Take sips of ORS every 5 minutes until your pee returns to normal.  If you are throwing up (vomiting) or have watery poop (diarrhea), keep trying to drink water, ORS, or both.  If you have watery poop, avoid:  Drinks with caffeine.  Fruit juice.  Milk.  Carbonated soft drinks.  Do not take salt tablets. This can lead to having too much sodium in your body (hypernatremia). GET HELP IF:  You cannot eat or drink without throwing up.  You have had mild watery poop for longer than 24 hours.  You have a fever. GET HELP RIGHT AWAY IF:   You have very strong thirst.  You have very bad watery poop.  You have not peed in 6-8 hours, or you have peed only a small amount of very dark pee.  You have shriveled skin.  You are dizzy, confused, or both.   This information is not intended to replace advice given to you by your health care provider. Make sure you discuss any questions you have with your health care provider.   Document Released: 08/23/2009 Document Revised: 07/18/2015 Document Reviewed: 03/14/2015 Elsevier Interactive Patient Education 2016 [employer]  Sepsis, Adult Sepsis is a serious infection of your blood or tissues that affects your whole body. The infection that causes sepsis may be bacterial, viral, fungal, or parasitic. Sepsis may be life threatening. Sepsis can cause your blood pressure to drop. This may result in shock. Shock causes your central nervous system and your organs to stop working correctly.  RISK FACTORS Sepsis can happen in anyone, but it is more likely to happen in people who have weakened immune systems. SIGNS AND SYMPTOMS  Symptoms of sepsis can include:  Fever or low body temperature (hypothermia).  Rapid breathing (hyperventilation).  Chills.  Rapid heartbeat (tachycardia).  Confusion or light-headedness.  Trouble breathing.  Urinating much less than usual.  Cool, clammy skin or red, flushed skin.  Other problems with the heart, kidneys, or brain. DIAGNOSIS  Your health care provider will likely do tests to look for an infection, to see if the infection has spread to your blood, and to see how serious your condition is. Tests can include:  Blood tests, including cultures of your blood.  Cultures of other fluids from your body, such as:  Urine.  Pus from wounds.  Mucus coughed up from your lungs.  Urine tests other than cultures.  X-ray exams or other imaging tests. TREATMENT  Treatment will begin with elimination of the source of infection. If your sepsis is likely caused by a bacterial or fungal infection, you will be given antibiotic or antifungal medicines. You may also  receive:  Oxygen.  Fluids through an IV tube.  Medicines to increase your blood pressure.  A machine to clean your blood (dialysis) if your kidneys fail.  A machine to help you breathe if your lungs fail. SEEK IMMEDIATE MEDICAL CARE IF: You get an infection or develop any of the signs and symptoms of sepsis after surgery or a hospitalization.   This information is not intended to replace advice given to  you by your health care provider. Make sure you discuss any questions you have with your health care provider.   Document Released: 07/26/2003 Document Revised: 03/13/2015 Document Reviewed: 07/04/2013 Elsevier Interactive Patient Education 2016 [place] is an infection that has spread to the bloodstream from the urinary tract. It is a severe illness. If it is not treated immediately, urosepsis can be life threatening. CAUSES  The cause of urosepsis is not known.  RISK FACTORS  Advanced age.  Having diabetes mellitus.  Having a weakened immune system.  Having kidney stones. SIGNS AND SYMPTOMS   Fever or low body temperature (hypothermia).  Rapid breathing (hyperventilation).  Chills.  Rapid heartbeat (tachycardia). When sepsis is severe, low blood pressure and decreased oxygen flow to your vital organs may also occur. This is called shock. DIAGNOSIS  Your health care provider may suspect urosepsis based on your medical history and a physical exam. Urine and blood tests may be done to confirm a diagnosis of urosepsis. Other tests, such as X-ray exams, ultrasound exams, or a CT scan, may be done to determine the severity of your condition.  TREATMENT  Urosepsis requires prompt treatment with medicines. You will receive fluids through an IV tube to support your blood pressure. You may also receive medicines to increase your blood pressure and medicines to control pain or nausea.  HOME CARE INSTRUCTIONS   Take medicines only as directed by your health care provider.  Drink enough fluids to keep your urine clear or pale yellow. In addition to water, cranberry juice is recommended.  Avoid caffeine, tea, and carbonated beverages. They can irritate the bladder.  Avoid alcohol. It may irritate the prostate, if this applies.  Get plenty of rest. Increase your activity as tolerated.  Keep all follow-up visits as directed by your health care  provider.  Empty your bladder often. Avoid holding urine for long periods of time.  Empty your bladder before and after sexual intercourse.  After a bowel movement, women should wipe from front to back using each tissue only once. SEEK MEDICAL CARE IF:   You develop back pain.  You develop nausea or vomiting.  Your symptoms are not better after 3 days.  You develop a fever or chills. SEEK IMMEDIATE MEDICAL CARE IF:   You feel light-headed or develop shortness of breath.  You are getting worse, not better. MAKE SURE YOU:  Understand these instructions.  Will watch your condition.  Will get help right away if you are not doing well or get worse.   This information is not intended to replace advice given to you by your health care provider. Make sure you discuss any questions you have with your health care provider.   Document Released: 10/27/2005 Document Revised: 11/17/2014 Document Reviewed: 07/04/2013 Elsevier Interactive Patient Education [employer].

## 2016-05-12 DIAGNOSIS — A419 Sepsis, unspecified organism: Secondary | ICD-10-CM | POA: Diagnosis not present

## 2016-05-12 DIAGNOSIS — R1310 Dysphagia, unspecified: Secondary | ICD-10-CM | POA: Diagnosis not present

## 2016-05-12 LAB — CULTURE, BLOOD (ROUTINE X 2)
CULTURE: NO GROWTH
Culture: NO GROWTH

## 2016-05-25 ENCOUNTER — Telehealth: Payer: Self-pay | Admitting: Family Medicine

## 2016-05-25 NOTE — Telephone Encounter (Signed)
Received call from [place] she is concerned that patient is having increased urinary retention, decreased appetite and foul smelling greenish urine. They are given an order for urinalysis with culture and patient is started on Keflex 500 mg po tid x 5 days.

## 2016-05-26 NOTE — Telephone Encounter (Signed)
PLEASE NOTE: All timestamps contained within this report are represented as [place] Standard Time. CONFIDENTIALTY NOTICE: This fax transmission is intended only for the addressee. It contains information that is legally privileged, confidential or otherwise protected from use or disclosure. If you are not the intended recipient, you are strictly prohibited from reviewing, disclosing, copying using or disseminating any of this information or taking any action in reliance on or regarding this information. If you have received this fax in error, please notify us immediately by telephone so that we can arrange for its return to [place]. Phone: [phone], Toll-Free: [phone], Fax: [phone] Page: 1 of 1 Call Id: [id number] [place] Night - Client Nonclinical Telephone Record [place] Night - Client Client Site [place] Physician Viviana Simpler - MD Contact Type Call Who Is Calling Physician / Provider / Hospital Call Type Provider Call [facility] Page Now Reason for Call Request to speak to Physician Initial Comment Needs to speak to on call regarding pt. Additional Comment Patient Name Stacey Torres Patient DOB 02/16/1929 Requesting Provider Jaymes Graff RN Physician Number [id number] Facility Name [facility] Phone DateTime Result/Outcome Message Type Notes Penni Homans - MD [id number] 05/25/2016 8:01:50 PM Paged On Call to Other Provider Doctor Paged Please call Colletta [place] with [facility] at [phone] Penni Homans - MD 05/25/2016 8:02:06 PM Paged On Call to Another Provider Message Result Sent a text page to the on-call with the facilities contact information. Call Closed By: Julien Girt Transaction Date/Time: 05/25/2016 7:55:52 PM (ET)

## 2016-05-26 NOTE — Telephone Encounter (Signed)
Reviewed status  Will await the culture report

## 2016-05-28 DIAGNOSIS — N390 Urinary tract infection, site not specified: Secondary | ICD-10-CM | POA: Diagnosis not present

## 2016-05-28 DIAGNOSIS — E46 Unspecified protein-calorie malnutrition: Secondary | ICD-10-CM | POA: Diagnosis not present

## 2016-05-28 DIAGNOSIS — R627 Adult failure to thrive: Secondary | ICD-10-CM | POA: Diagnosis not present

## 2016-05-28 DIAGNOSIS — A419 Sepsis, unspecified organism: Secondary | ICD-10-CM | POA: Diagnosis not present

## 2016-06-10 DIAGNOSIS — R112 Nausea with vomiting, unspecified: Secondary | ICD-10-CM

## 2016-07-11 DEATH — deceased

## 2017-02-13 IMAGING — DX DG CHEST 1V PORT
1 series · 1 of 1 positions shown · non-contrast
Comparison: 07/06/2015

CLINICAL DATA: Sepsis

EXAM:
PORTABLE CHEST 1 VIEW

[chest ap]
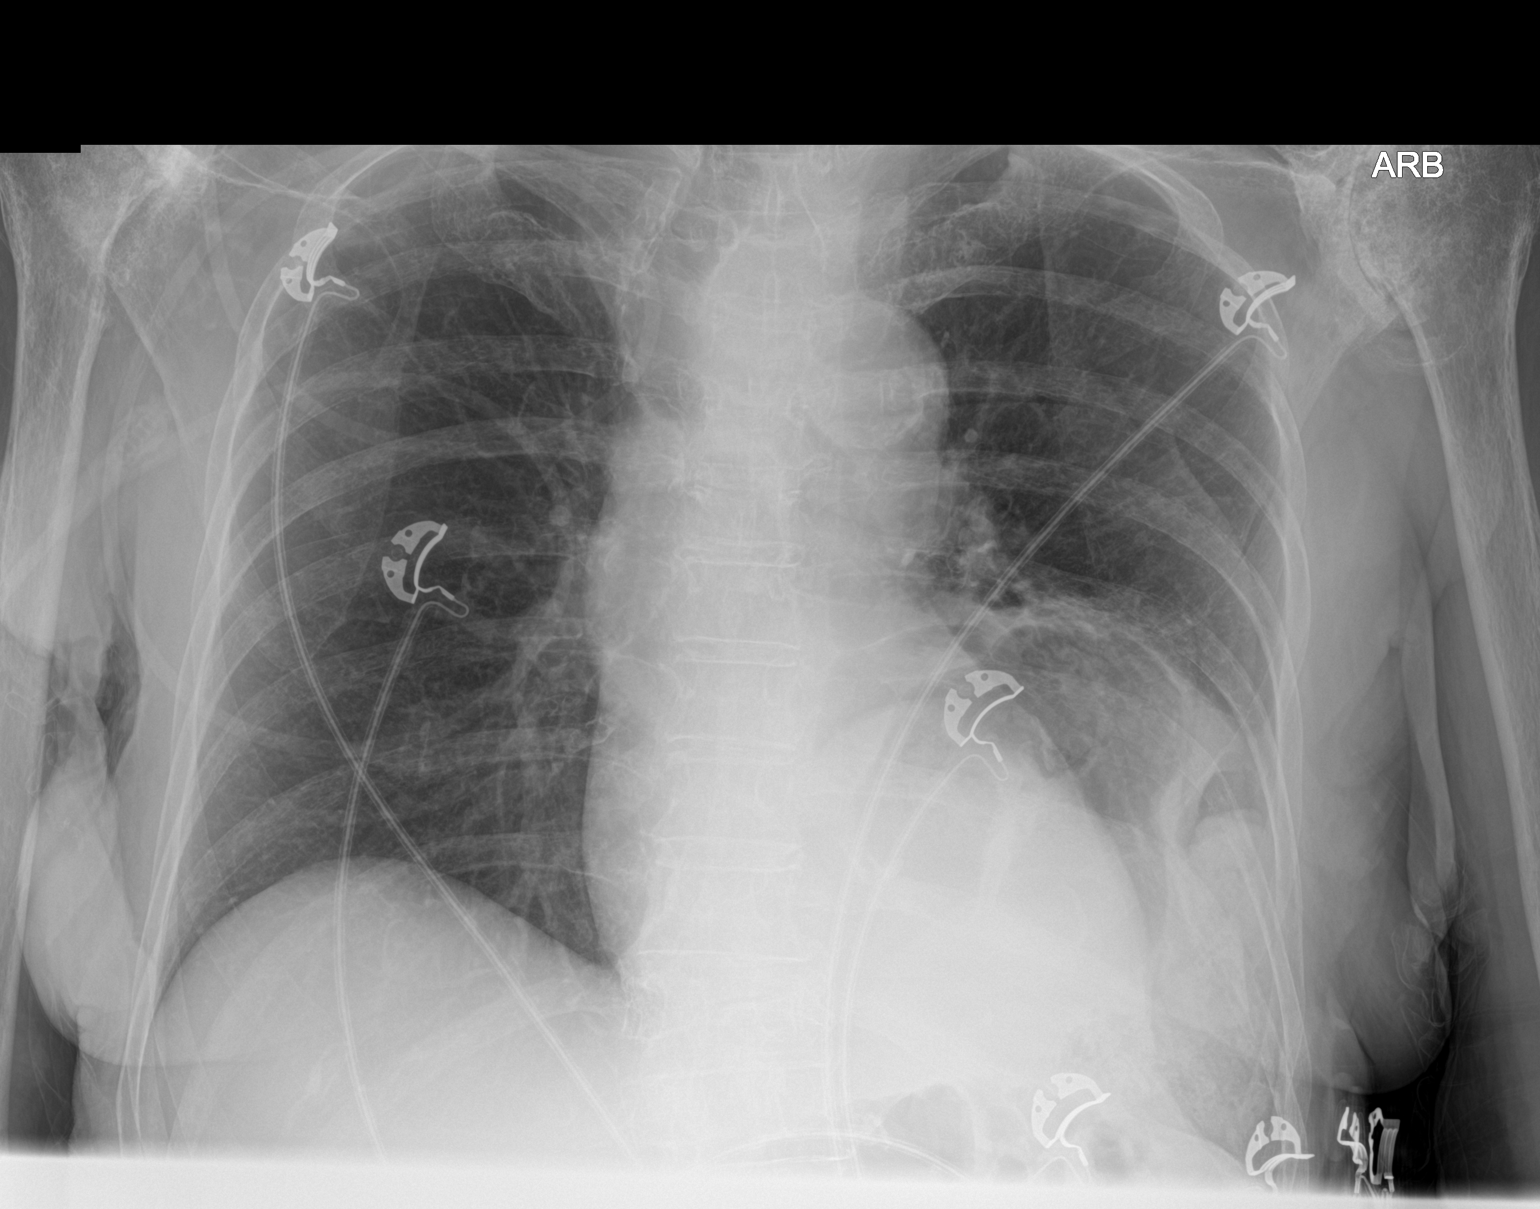

[1 of 1 positions shown; findings below may reference images not displayed]

FINDINGS: Cardiomediastinal silhouette is stable. No infiltrate or pulmonary
edema. Again noted chronic eventration of the left posterior
hemidiaphragm. Stable degenerative changes bilateral shoulders. Mild
left basilar atelectasis.
IMPRESSION: No active disease.  No significant change.
